# Patient Record
Sex: Male | Born: 1950 | Race: White | Hispanic: No | Marital: Married | Smoking: Former smoker
Health system: Southern US, Community
[De-identification: ages and names within clinical notes are randomized; demographics above are authoritative.]

## PROBLEM LIST (undated history)

## (undated) DIAGNOSIS — Z789 Other specified health status: Secondary | ICD-10-CM

## (undated) DIAGNOSIS — E119 Type 2 diabetes mellitus without complications: Secondary | ICD-10-CM

## (undated) DIAGNOSIS — H9190 Unspecified hearing loss, unspecified ear: Secondary | ICD-10-CM

## (undated) DIAGNOSIS — J449 Chronic obstructive pulmonary disease, unspecified: Secondary | ICD-10-CM

## (undated) DIAGNOSIS — I2510 Atherosclerotic heart disease of native coronary artery without angina pectoris: Secondary | ICD-10-CM

## (undated) DIAGNOSIS — G629 Polyneuropathy, unspecified: Secondary | ICD-10-CM

## (undated) DIAGNOSIS — G600 Hereditary motor and sensory neuropathy: Secondary | ICD-10-CM

## (undated) DIAGNOSIS — E785 Hyperlipidemia, unspecified: Secondary | ICD-10-CM

## (undated) HISTORY — PX: APPENDECTOMY: SHX54

## (undated) HISTORY — DX: Unspecified hearing loss, unspecified ear: H91.90

## (undated) HISTORY — DX: Hyperlipidemia, unspecified: E78.5

## (undated) HISTORY — DX: Atherosclerotic heart disease of native coronary artery without angina pectoris: I25.10

## (undated) HISTORY — DX: Other specified health status: Z78.9

## (undated) HISTORY — PX: HERNIA REPAIR: SHX51

## (undated) HISTORY — DX: Type 2 diabetes mellitus without complications: E11.9

## (undated) HISTORY — DX: Polyneuropathy, unspecified: G62.9

## (undated) HISTORY — DX: Hereditary motor and sensory neuropathy: G60.0

---
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
## 2002-05-29 ENCOUNTER — Inpatient Hospital Stay (HOSPITAL_COMMUNITY): Admission: EM | Admit: 2002-05-29 | Discharge: 2002-06-03 | Payer: Self-pay | Admitting: *Deleted

## 2002-05-29 ENCOUNTER — Encounter: Payer: Self-pay | Admitting: Cardiology

## 2002-06-01 ENCOUNTER — Encounter: Payer: Self-pay | Admitting: Cardiology

## 2004-05-10 ENCOUNTER — Ambulatory Visit: Payer: Self-pay | Admitting: Cardiology

## 2004-09-15 ENCOUNTER — Encounter: Payer: Self-pay | Admitting: Cardiology

## 2005-05-09 ENCOUNTER — Ambulatory Visit: Payer: Self-pay | Admitting: Cardiology

## 2005-05-24 ENCOUNTER — Ambulatory Visit: Payer: Self-pay | Admitting: Cardiology

## 2006-08-29 ENCOUNTER — Ambulatory Visit: Payer: Self-pay | Admitting: Cardiology

## 2007-04-24 DIAGNOSIS — I2510 Atherosclerotic heart disease of native coronary artery without angina pectoris: Secondary | ICD-10-CM

## 2007-04-24 HISTORY — DX: Atherosclerotic heart disease of native coronary artery without angina pectoris: I25.10

## 2007-04-24 HISTORY — PX: CARDIAC CATHETERIZATION: SHX172

## 2007-09-01 ENCOUNTER — Ambulatory Visit: Payer: Self-pay | Admitting: Cardiology

## 2007-12-02 ENCOUNTER — Encounter: Payer: Self-pay | Admitting: Cardiology

## 2007-12-02 ENCOUNTER — Ambulatory Visit: Payer: Self-pay | Admitting: Cardiology

## 2007-12-02 ENCOUNTER — Inpatient Hospital Stay (HOSPITAL_COMMUNITY): Admission: EM | Admit: 2007-12-02 | Discharge: 2007-12-04 | Payer: Self-pay | Admitting: Emergency Medicine

## 2007-12-03 ENCOUNTER — Encounter: Payer: Self-pay | Admitting: Cardiology

## 2007-12-04 ENCOUNTER — Encounter: Payer: Self-pay | Admitting: Cardiology

## 2007-12-15 ENCOUNTER — Ambulatory Visit: Payer: Self-pay | Admitting: Cardiology

## 2008-01-23 ENCOUNTER — Encounter: Payer: Self-pay | Admitting: Cardiology

## 2008-01-23 ENCOUNTER — Ambulatory Visit: Payer: Self-pay

## 2008-10-26 ENCOUNTER — Telehealth: Payer: Self-pay | Admitting: Cardiology

## 2008-11-03 ENCOUNTER — Encounter: Payer: Self-pay | Admitting: Cardiology

## 2008-11-03 ENCOUNTER — Encounter: Payer: Self-pay | Admitting: Physician Assistant

## 2008-11-03 ENCOUNTER — Ambulatory Visit: Payer: Self-pay | Admitting: Cardiology

## 2009-01-05 DIAGNOSIS — I2510 Atherosclerotic heart disease of native coronary artery without angina pectoris: Secondary | ICD-10-CM | POA: Insufficient documentation

## 2009-01-05 DIAGNOSIS — E785 Hyperlipidemia, unspecified: Secondary | ICD-10-CM | POA: Insufficient documentation

## 2010-01-02 ENCOUNTER — Ambulatory Visit: Payer: Self-pay | Admitting: Cardiology

## 2010-05-23 NOTE — Cardiovascular Report (Signed)
Summary: Cardiac Catheterization  Cardiac Catheterization   Imported By: Dorise Hiss 12/30/2009 15:13:02  _____________________________________________________________________  External Attachment:    Type:   Image     Comment:   External Document

## 2010-05-23 NOTE — Assessment & Plan Note (Signed)
Summary: 60 YR FU   Visit Type:  Follow-up Primary Jaceyon Strole:  Ignacia Bayley  CC:  CAD.  History of Present Illness: The patient presents for followup. In the past 60 year he has done well and has had no coronary symptoms. He denies any chest pain, neck or arm discomfort. He has no palpitations, presyncope or syncope. He has some chronic dyspnea but denies any resting shortness of breath, PND or orthopnea. He has had no palpitations, presyncope or syncope. He has not tolerated statins but was recently given a prescription for fenofibrate. He did not start this as he wanted to discuss it today. He says his blood sugars were controlled.  Preventive Screening-Counseling & Management  Alcohol-Tobacco     Smoking Status: quit     Year Quit: 2007  Current Medications (verified): 1)  Metoprolol Tartrate 50 Mg Tabs (Metoprolol Tartrate) .... Take One Tablet By Mouth Twice A Day 2)  Aspirin 81 Mg Tbec (Aspirin) .... Take One Tablet By Mouth Daily 3)  Fish Oil 1000 Mg Caps (Omega-3 Fatty Acids) .... Take 2 Capsules By Mouth Daily 4)  Lisinopril 10 Mg Tabs (Lisinopril) .... Take 1 Tablet By Mouth Once A Day 5)  Gabapentin 300 Mg Caps (Gabapentin) .... Take 1 Tablet By Mouth Two Times A Day 6)  Metformin Hcl 500 Mg Tabs (Metformin Hcl) .... Take 1 Tablet By Mouth Two Times A Day 7)  Lipofen 150 Mg Caps (Fenofibrate) .... Take 1 Tablet By Mouth Once A Day (Patient Hasn't Started This Yet)  Allergies (verified): 1)  ! * Statins  Comments:  Nurse/Medical Assistant: The patient's medication bottles and allergies were reviewed with the patient and were updated in the Medication and Allergy Lists.  Past History:  Past Medical History: HYPERLIPIDEMIA-MIXED (ICD-272.4) CAD, NATIVE VESSEL (ICD-414.01)a.           History of inferior myocardial infarction, treated with      CYPHER stenting of circumflex and right coronary artery, February      2004.     b.     Status post non-ST segment  myocardial infarction treated      with PROMUS drug-eluting stenting of distal right coronary artery      and posterior descending (coronary) artery, August 2009.     c.     EF 55-60%.     d.     Non-ischemic exercise Myoview; EF 50%, October 2009. Type 2 diabetes mellitus Charcot-Marie-Tooth  Review of Systems       As stated in the HPI and negative for all other systems.   Vital Signs:  Patient profile:   60 year old male Height:      76 inches Weight:      197 pounds BMI:     24.07 Pulse rate:   76 / minute BP sitting:   127 / 83  (left arm) Cuff size:   regular  Vitals Entered By: Carlye Grippe ([birth year] 9:00 AM)  Physical Exam  General:  Well developed, well nourished, in no acute distress. Head:  normocephalic and atraumatic Eyes:  PERRLA/EOM intact; conjunctiva and lids normal. Mouth:  Edentulous.  Oral mucosa normal. Neck:  Neck supple, no JVD. No masses, thyromegaly or abnormal cervical nodes. Chest Wall:  no deformities or breast masses noted Lungs:  Clear bilaterally to auscultation and percussion. Heart:  Non-displaced PMI, chest non-tender; regular rate and rhythm, S1, S2 without murmurs, rubs or gallops. Carotid upstroke normal, no bruit. Normal abdominal aortic size, no bruits.  Femorals normal pulses, no bruits. Pedals normal pulses. No edema, no varicosities. Abdomen:  Bowel sounds positive; abdomen soft and non-tender without masses, organomegaly, or hernias noted. No hepatosplenomegaly. Msk:  Back normal, normal gait. Muscle strength and tone normal. Extremities:  No clubbing or cyanosis. Neurologic:  Alert and oriented x 3. Skin:  Intact without lesions or rashes. Cervical Nodes:  no significant adenopathy Inguinal Nodes:  no significant adenopathy Psych:  Normal affect.   EKG  Procedure date:  01/02/2010  Findings:      Sus rhythm, rate 79, left axis deviation, left anterior fascicular block, no acute ST-T wave changes  Impression &  Recommendations:  Problem # 1:  CAD, NATIVE VESSEL (ICD-414.01) The patient is having no new symptoms. No further cardiovascular testing is suggested. He will continue with risk reduction. I suggested more walking and gave him specific instructions. Orders: EKG w/ Interpretation (93000)  Problem # 2:  HYPERLIPIDEMIA-MIXED (ICD-272.4) I assured him that it would be reasonable to start the fenofibrate. He can then have his lipids checked in about 8 weeks.  This will be followed by his primary MD.  Patient Instructions: 1)  Your physician recommends that you continue on your current medications as directed. Please refer to the Current Medication list given to you today. 2)  Your physician wants you to follow-up in: 1 year. You will receive a reminder letter in the mail about two months in advance. If you don't receive a letter, please call our office to schedule the follow-up appointment. Prescriptions: LISINOPRIL 10 MG TABS (LISINOPRIL) Take 1 tablet by mouth once a day  #30 x 6   Entered by:   Carlye Grippe   Authorized by:   Rollene Rotunda, MD, [facility]   Signed by:   Carlye Grippe on 01/02/2010   Method used:   Electronically to        [facility]  [place] [address] (retail)       [address]       [place], [place]  [address]       Ph: [phone] or [phone]       Fax: [phone]   RxID:   [id number] METOPROLOL TARTRATE 50 MG TABS (METOPROLOL TARTRATE) Take one tablet by mouth twice a day  #60 x 6   Entered by:   Carlye Grippe   Authorized by:   Rollene Rotunda, MD, [facility]   Signed by:   Carlye Grippe on 01/02/2010   Method used:   [facility] to        [facility]  [place] [address]       [address]       [place], [place]  [address]       Ph: [phone] or [phone]       Fax: [phone]   RxID:   [id number]

## 2010-09-05 NOTE — Assessment & Plan Note (Signed)
[facility]                          [facility] NOTE   NAME:Franklin [name], Franklin [name]                     MRN:          [id number]  DATE:09/01/2007                            DOB:          09-01-1950    HISTORY OF PRESENT ILLNESS:  The patient is a 60 year old male with  history of ischemic cardiomyopathy, ejection fraction 45-50%.  The  patient  is status post stent placement with drug-eluting stent to the  circumflex right coronary artery in 2004.  He has done extremely well.  He has not required any repeat catheterization or re-intervention.  Unfortunately, the patient has lost his job and wants to minimize cost  of his medical workup.  He denies any chest pain, palpitation, or  syncope.   MEDICATIONS:  1. Toprol XL 25 mg p.o. daily.  2. Aspirin 81 mg p.o. daily.  3. Fish oil.   PHYSICAL EXAMINATION:  VITAL SIGNS:  Blood pressure 135/81, heart rate  57, weight 218 pounds.  Neck exam, normal carotid upstrokes, no carotid bruits.  LUNGS:  Clear breath sounds bilaterally.  HEART:  Regular rate and rhythm.  Normal S1, S2.  ABDOMEN:  Soft.  EXTREMITIES:  No cyanosis, edema.   PROBLEM LIST:  1. Coronary artery.      a.     Status post stent placement with drug-eluting stent to       circumflex right coronary 2004.      b.     New York Heart Association class I.  2. Mild left ventricular dysfunction.  Ejection fraction 45% without      heart failure symptoms.  3. Former tobacco use, discontinued.   PLAN:  1. The patient doing extremely well.  He reports no substernal chest      pain.  He does not need a stress test at the present time.  It can      be done in 1 year.  2. The patient can continue on his current medical regimen.     Learta Codding, MD,FACC  Electronically Signed    GED/MedQ  DD: 09/01/2007  DT: 09/01/2007  Job #: [id number]

## 2010-09-05 NOTE — Assessment & Plan Note (Signed)
[facility]                          [facility] NOTE   NAME:Jian, PRESTYN STANCO                     MRN:          [id number]  DATE:11/03/2008                            DOB:          Dec 04, 1950    PRIMARY CARDIOLOGIST:  Learta Codding, MD, [facility]   REASON FOR VISIT:  Annual followup.   Mr. Tapp reports no interim development of exertional angina  pectoris or any chest discomfort similar to his previous myocardial  infarctions.  He does suggest atypical chest pain with occasional sharp  pain which occurs at rest, and which lasts less than a minute in  duration.   Mr. Rounsaville was previously on Plavix, and stopped taking this after 9  months.  He was obtaining this in generic form, from [place], until he  was no longer able to do so.  He remains on full-dose aspirin.   Mr. Cosma does report exertional dyspnea, but denies PND, orthopnea,  or lower extremity edema.   The patient quit smoking in 2009.  Mr. Dunavant does carry  nitroglycerin with him, but has not had to use any.   A 12-lead EKG today indicates NSR at 86 bpm with LAD and nonspecific ST  abnormalities.   CURRENT MEDICATIONS:  1. Aspirin 325 daily.  2. Metformin 500 b.i.d.  3. Lisinopril 10 daily.  4. Metoprolol 50 daily.   PHYSICAL EXAMINATION:  VITAL SIGNS:  Blood pressure 134/78, pulse 86 and  regular, weight 209 (down 6).  GENERAL:  A 60 year old male sitting upright in no distress.  HEENT:  Normocephalic, atraumatic.  NECK:  Palpable carotid pulses without bruits; no JVD.  LUNGS:  Clear to auscultation in all fields.  HEART:  Regular rate and rhythm.  Positive S4.  No murmurs.  ABDOMEN:  Soft, nontender with intact bowel sounds.  EXTREMITIES:  Palpable pulses without edema.  NEUROLOGIC:  No focal deficit.   IMPRESSION:  1. Coronary artery disease.      a.     History of inferior myocardial infarction, treated with       CYPHER stenting of circumflex and  right coronary artery, February       2004.      b.     Status post non-ST segment myocardial infarction treated       with PROMUS drug-eluting stenting of distal right coronary artery       and posterior descending (coronary) artery, August 2009.      c.     EF 55-60%.      d.     Non-ischemic exercise Myoview; EF 50%, October 2009.  2. History of tobacco.  3. Dyslipidemia.      a.     Statin intolerant.  4. Type 2 diabetes mellitus.   PLAN:  1. Up titrate metoprolol tartrate to 50 mg b.i.d., for better basal      heart rate and blood pressure control, as well as antianginal      benefit.  2. Down titrate aspirin to 81 mg daily, and continue indefinitely.  We      will add Omega-3 fish oil  2 g b.i.d.  Of note, the patient reports      intolerance to numerous statins.  3. Aggressive lipid management is recommended with target LDL of 70 or      less, if feasible.  4. Recommend cardiac rehab.  5. Schedule return clinic followup with myself and Dr. Andee Lineman in 6      months.      Rozell Searing, PA-C  Electronically Signed      Rollene Rotunda, MD, [facility]  Electronically Signed   GS/MedQ  DD: 11/03/2008  DT: 11/04/2008  Job #: [id number]   cc:   [facility]

## 2010-09-05 NOTE — Cardiovascular Report (Signed)
NAME:  Franklin [name], Franklin [name] NO.:  [id number]   MEDICAL RECORD NO.:  [id number]          PATIENT TYPE:  INP   LOCATION:  2913                         FACILITY:  [facility]   PHYSICIAN:  Verne Carrow, MDDATE OF BIRTH:  February 22, 1951   DATE OF PROCEDURE:  12/03/2007  DATE OF DISCHARGE:  12/04/2007                            CARDIAC CATHETERIZATION   PROCEDURES:  1. Percutaneous coronary intervention of distal right coronary artery      bifurcation of the posterior descending artery and the      posterolateral artery with placement of a Promus drug-eluting      stent.  2. Percutaneous coronary intervention of the distal right coronary      artery with placement of a Promus drug-eluting stent.   INDICATION:  Chest pain.   OPERATOR:  Verne Carrow, MD   DETAILS OF PROCEDURE:  The patient was brought to the heart  catheterization lab for a diagnostic left heart catheterization.  He  signed informed consent prior to his diagnostic procedure that included  consent for percutaneous coronary intervention if necessary.  After  preliminary review of his cath films, it was felt that he would benefit  from an intervention of his distal right coronary artery as well as the  posterior descending artery.  The 5-French sheath used for the  diagnostic procedure was changed to a 7-French sheath.  A JR4, 7-French  guide was used to engage the right coronary artery.  A Cougar  intracoronary wire was passed down the right coronary into the posterior  descending branch.  A BMW wire was passed into the posterolateral  branch.  At this point, a 2.5 x 10 mm cutting balloon was used to  predilate the osteum of the posterolateral branch.  It was also used to  predilate the distal right coronary artery just before the bifurcation  of the PDA and posterolateral branch.  A Promus 2.5 x 18 mm stent was  then placed in the distal right coronary artery extending into the  posterior descending  artery.  A 2.75 x 15 mm noncompliant balloon was  then used for post dilation of this stent.  Following the stent  placement, there was a lesion in the distal right coronary artery, that  was about 20-mm proximal to the stent placement that looked worsened  with about 90% stenosis.  I elected to place a 2.75 x 8 mm Promus drug-  eluting stent in this location.  A 3.0 x 6 mm balloon was then used for  post dilation.  There was TIMI 3 flow down the entire right coronary  artery following the procedure.  Prestenosis was 90% in the distal  right/posterior descending artery and following procedure it was 0%.  The prestenosis in the more proximal lesion in the distal RCA was 90%  prestenosis and 0% poststenosis.  Integrilin was used for antiplatelet  therapy during the procedure along with heparin for anticoagulation.  The patient will be continued on 12 hours of  Integrilin on the floor.  He was given 300 mg of Plavix here in the cath lab.  An Angio-Seal  closure  device was placed in the right femoral artery.   IMPRESSION:  Successful percutaneous coronary intervention of the distal  right coronary artery and posterior descending artery with placement of  2 Promus drug-eluting stents.   RECOMMENDATIONS:  I have recommended continuing the aspirin, statin, and  beta-blocker.  The patient will be continued on Plavix for at least 1  year, but more than likely for his lifetime as long as he can tolerate  it.      Verne Carrow, MD  Electronically Signed     CM/MEDQ  D:  12/03/2007  T:  12/05/2007  Job:  [id number]   cc:   Learta Codding, MD,FACC

## 2010-09-05 NOTE — Discharge Summary (Signed)
NAME:  CUTTER, PASSEY NO.:  [id number]   MEDICAL RECORD NO.:  [id number]          PATIENT TYPE:  INP   LOCATION:  2913                         FACILITY:  [facility]   PHYSICIAN:  Verne Carrow, MDDATE OF BIRTH:  1951-03-04   DATE OF ADMISSION:  12/02/2007  DATE OF DISCHARGE:  12/04/2007                               DISCHARGE SUMMARY   PRIMARY CARDIOLOGIST:  Learta Codding, MD, [facility]   DISCHARGE DIAGNOSES:  1. Non-ST-segment elevation myocardial infarction.  2. Newly diagnosed diabetes mellitus.   SECONDARY DIAGNOSES:  1. Coronary artery disease status post previous Cypher drug-eluting      stent placement in the left circumflex and right coronary artery.  2. Hypertension.  3. Hyperlipidemia.  4. Ischemic cardiomyopathy.  5. Ongoing tobacco abuse.   ALLERGIES:  He is intolerant to LIPITOR and Crestor both causing  myalgias.   PROCEDURE:  Left heart cardiac catheterization with successful PCI and  stenting of the distal RCA, PDA and mid RCA with 2.5 x 18 mm PROMUS drug-  eluting stent placed in the distal RCA and a 2.75 x 8 mm PROMUS drug-  eluting stent placed in the mid RCA.   HISTORY OF PRESENT ILLNESS:  A 60 year old Caucasian male with prior  history of CAD who was in his usual state of health until December 02, 2007, when he had sudden onset of substernal chest pressure radiating to  his neck and left arm lasting approximately 15 minutes resolving  spontaneously.  He presented to the [facility] ED where his EEG showed  nonspecific ST-T changes and first set of cardiac markers were normal.  His symptomatology was suspicious for acute coronary syndrome.  The  patient was admitted for further evaluation.   HOSPITAL COURSE:  The patient had no additional chest discomfort,  however, did have mild elevation in his troponin to 0.11 with normal CKs  and MBs.  Symptoms were felt to be consistent with previous angina and  he underwent left heart cardiac  catheterization on December 03, 2007.  Catheterization revealed 90% stenosis in the distal RCA with diffuse  nonobstructive disease in the left coronary tree.  EF was 55-60%.  Films  were reviewed and decision was made to pursue PCI of the distal RCA.  The mid and distal RCA along with the PDA were successfully stented with  two PROMUS drug-eluting stent as outlined above.  The patient was  initiated on Plavix and maintained on aspirin and beta blocker therapy.  We have also initiated pravastatin therapy, avoiding high-dose statin  given prior history of intolerance.  We have had him seen by case  management in order to assist in affording his Plavix therapy.  He has  also been counseled on the importance of smoking cessation.  He has been  ambulating without recurrent discomfort be discharged home today in good  condition.   DISCHARGE LABORATORY DATA:  Hemoglobin 14.1, hematocrit 40.2, WBC 6.3,  platelets 191, MCV 90.7.  Sodium 140, potassium 3.9, chloride 106, CO2  28, BUN 16, creatinine 1.14, glucose 128, INR 1.1, total bilirubin 0.6,  alkaline phosphatase 55, AST  21, ALT 36, albumin 3.7, CK 121, MB 1.7,  troponin I 0.09, total cholesterol 148, triglycerides 136, HDL 26, LDL  95, calcium 8.8, magnesium 2.4, and hemoglobin A1c 7.4.   DISPOSITION:  The patient is being discharged home today in good  condition.   FOLLOWUP PLANS AND APPOINTMENTS:  The patient has followup with Dr.  Andee Lineman on December 15, 2007, at 8:30 a.m.  We have counseled him to follow  up with primary care for further followup of hyperglycemia with fasting  sugars greater than 126 indicative of type 2 diabetes mellitus.   DISCHARGE MEDICATIONS:  1. Aspirin 325 mg daily.  2. Plavix 75 mg.  3. Lopressor 25 mg b.i.d.  4. Nitroglycerin 0.4 mg sublingual p.r.n. chest pain.  5. Pravachol 40 mg at bedtime.   The patient has refused any therapy for his diabetes at this time.   OUTSTANDING LABORATORY STUDIES:  None.    DURATION OF DISCHARGE ENCOUNTER:  60 minutes including physician time.      Nicolasa Ducking, ANP      Verne Carrow, MD  Electronically Signed    CB/MEDQ  D:  12/04/2007  T:  12/05/2007  Job:  [id number]

## 2010-09-05 NOTE — H&P (Signed)
NAME:  Franklin [name], Franklin [name] NO.:  [id number]   MEDICAL RECORD NO.:  [id number]          PATIENT TYPE:  INP   LOCATION:  2913                         FACILITY:  [facility]   PHYSICIAN:  Vernice Jefferson, MD          DATE OF BIRTH:  [birth year]   DATE OF ADMISSION:  12/02/2007  DATE OF DISCHARGE:                              HISTORY & PHYSICAL   PRIVATE CARDIOLOGIST:  Learta Codding, MD, [facility]   CHIEF COMPLAINT:  Chest discomfort x6 hours.   HISTORY OF PRESENTING ILLNESS:  The patient is a 60 year old white male  with history of coronary artery disease status post PCI in 2004 with 2  Cypher drug-eluting stents to his right coronary artery and 1 Cypher  drug-eluting stent to his left circumflex, hypertension, and  hyperlipidemia who comes in with complaints of chest discomfort.  The  patient reports that since his stent placement in 2004 he has been  completely asymptomatic, denies any history of chest pain, chest  discomfort, worsening exertional shortness of breath, or anginal  symptoms, since this has followed up with Dr. Andee Lineman on several  occasions.  He has had several nuclear stress tests in the past that  have all been  negative.  However, today he reports it was different,  reports that he had had some substernal chest discomfort that lasted  approximately 10 minutes, earlier approximately 2, went away with an  aspirin, and then recurred while sitting down after dinner that resolved  after receiving 2 doses of nitroglycerin.  Reports this pain was  pressure like, radiated up to his left neck and jaw as well as left arm,  and was similar to his prior presentation with his MI.  Denied any  shortness of breath, diaphoresis, or nausea with this, did not have any  syncope or presyncopal episodes, and has been otherwise in good health  up until today.   PAST MEDICAL HISTORY:  1. Coronary artery disease.  He is status post a Cypher 3 x 13 mm drug-      eluting stent to his left  circumflex artery and status post 2 drug-      eluting stents to his right coronary artery, 3 x 23 and 3 x 33 mm      stents.  Also had some residual proximal LAD per the cath report.  2. Hyperlipidemia.  3. Hypertension.  4. History of tobacco abuse, now has quit.   MEDICATIONS:  1. Toprol-XL 25 mg a day.  2. Aspirin 81 mg a day.  3. Fish oil.   SOCIAL HISTORY:  Former smoker but has quit after his MI in 2004.  Lives  in [place].  No alcohol.  No drug use.   FAMILY HISTORY:  Reviewed and noncontributory to the patient's current  medical condition.   ALLERGIES:  No known drug allergies.   REVIEW OF SYSTEMS:  Negative 11-point review systems except for those  dictated in the above HPI.   PHYSICAL EXAMINATION:  VITAL SIGNS:  Blood pressure of 131/72, heart  rate of 60, respirations of 12, and T-max afebrile.  GENERAL:  A well-developed, well-nourished white male in no acute  distress.  HEENT:  Moist mucous membranes.  No scleral icterus or conjunctival  pallor.  NECK:  Supple.  Full range of motion.  No jugular venous distention.  CARDIOVASCULAR:  Regular rate and rhythm.  No rubs, murmurs, or gallops.  CHEST:  Clear to auscultation bilaterally.  No wheezes, rales, or  rhonchi.  ABDOMEN:  Soft, nontender, and nondistended.  Normoactive bowel sounds.  EXTREMITIES:  No peripheral edema, pulses 2+ bilaterally.  NEURO:  Nonfocal.   Chest x-ray, no acute infiltrative process.  EKG demonstrates normal  sinus rhythm and left anterior fascicular block with nonspecific ST and  T-wave abnormality.   LABORATORY DATA:  Significant for white count of 8.4, hemoglobin of  15.3, and platelets 202.  BUN and creatinine of 18 and 1.2, glucose 178,  which is elevated for him.  First set of biomarkers are negative.   IMPRESSION:  1. Probable acute coronary syndrome (ACS).  2. Hypertension.  3. Impaired glucose tolerance.   PLAN:  Admit the patient to telemetry for Dr. Andee Lineman.  Serial   biomarkers.  We will initiate heparin and aspirin as well as give him a  high-dose statin initially and beta-blocker therapy.  We will re-  stratify for further cardiovascular risk factors with hemoglobin A1c and  fasting lipid panel.  Keep him n.p.o. for noninvasive versus invasive.  Risk stratification in the morning per the primary cardiology team.      Vernice Jefferson, MD  Electronically Signed     JT/MEDQ  D:  12/02/2007  T:  12/04/2007  Job:  [id number]

## 2010-09-05 NOTE — Cardiovascular Report (Signed)
NAME:  Franklin [name], Franklin [name] NO.:  [id number]   MEDICAL RECORD NO.:  [id number]          PATIENT TYPE:  INP   LOCATION:  2913                         FACILITY:  [facility]   PHYSICIAN:  Bevelyn Buckles. Bensimhon, MDDATE OF BIRTH:  28-Jan-1951   DATE OF PROCEDURE:  12/03/2007  DATE OF DISCHARGE:                            CARDIAC CATHETERIZATION   PATIENT IDENTIFICATION:  Franklin [name] is a 60 year old male with a  history of hypertension and coronary artery disease.  He is status post  percutaneous intervention on his left circumflex and RCA in 2004 with  Cypher drug-eluting stents.  He was doing well until yesterday when he  developed chest pain very similar to his previous angina.  His troponin  was just minimally elevated at 0.11 and MBs were negative.  He is  referred for diagnostic catheterization.   PROCEDURES PERFORMED:  1. Selective coronary angiography.  2. Left heart catheterization.  3. Left ventriculogram.   DESCRIPTION OF PROCEDURE:  The risks and indication of the procedure  were explained.  Consent was signed and placed on the chart.  A 5-French  arterial sheath was placed in right femoral artery using a modified  Seldinger technique.  Standard catheters including JL-4, JR-4, and  angled pigtail were used for procedure.  All catheter exchanges were  made over wire.  There were no apparent complications.   Central aortic pressure 135/71 with a mean of 95.  LV pressure was 132  with an EDP of 16.  There was no aortic stenosis.   Left main had distal 20% stenosis.   LAD coursed through the apex gave off two diagonal branches.  There was  a 60-70% proximal lesion in the LAD.  This was smooth.  There was mild  20-30% diffuse disease distally.  In the small first diagonal there was  a 40-50% focal stenosis.   Left circumflex gave off an OM-1 and OM-2.  There was a stent in the  proximal portion of the left circumflex.  Prior to the stent, there was  a 40% stenosis  in the mid portion of the left circ.  At the takeoff of  the OM-1, there was 70% focal stenosis.   Right coronary artery was a large dominant vessel and had a long area of  diffuse stenting from the proximal portion and almost down to the distal  bend.  There was mild diffuse disease within the stents, but the stents  were patent with good flow.  Right after the stent, there was a 40-50%  focal stenosis.  Distal to this, right before the PDA, there was a 90%  focal stenosis.  In the mid to distal PDA, there was a 40% stenosis.   Left ventriculogram done in the RAO position showed an EF of 55-60% with  no regional wall motion abnormalities.   ASSESSMENT:  1. Three-vessel coronary artery disease as described above.  2. The right coronary artery and left circumflex stents are patent.  3. There is a high-grade lesion in the distal right coronary artery.  4. Normal left ventricular function.   PLAN:  For percutaneous intervention on  the distal right coronary artery  by Dr. Clifton James.  He will need aggressive risk factor management.      Bevelyn Buckles. Bensimhon, MD  Electronically Signed     DRB/MEDQ  D:  12/03/2007  T:  12/04/2007  Job:  [id number]

## 2010-09-05 NOTE — Assessment & Plan Note (Signed)
[facility]                          [facility]   NAME:[name], [name]                     MRN:          [id number]  DATE:08/29/2006                            DOB:          10-24-1950    HISTORY OF PRESENT ILLNESS:  The patient is a 60 year old male with a  history of mild ischemic cardiomyopathy, ejection fraction 45-50%.  The  patient has a history of a prior inferior wall myocardial infarction  requiring a stent placement to the circumflex and right coronary artery.  He had a stress test done a year ago which showed no definite ischemia.  He has been doing quite well.  He reports no chest pain, shortness of  breath, orthopnea, PND.  He continues to use hawthorn berries as a  substitute for an ACE inhibitor.   MEDICATIONS:  1. Toprol XL 25 mg p.o. daily.  2. Hawthorn berries 550 mg daily.  3. Enteric-coated aspirin 325 daily.  4. Fish oil daily.   PHYSICAL EXAMINATION:  VITAL SIGNS:  Blood pressure 135/74, heart rate  73, weight is 211 pounds.  NECK:  Normal carotid upstrokes.  No carotid bruits.  LUNGS:  Clear.  HEART:  Regular rate and rhythm.  Normal S1 S2.  No murmur.  ABDOMEN:  Soft.  EXTREMITIES:  No edema.   PROBLEM LIST:  1. Coronary artery disease.      a.     Status post stent placement with a drug-eluting platform       circumflex and right coronary artery, 2004.      b.     New York Heart Association class I.      c.     Mild left ventricular dysfunction, ejection fraction 45-50%.      d.     Former tobacco use, discontinued.   PLAN:  1. The patient has been doing well.  He does not need a stress test at      this point in time.  2. The patient can continue on hawthorn berries.  3. We will repeat a stress test in two years.     Learta Codding, MD,FACC     GED/MedQ  DD: 08/29/2006  DT: 08/29/2006  Job #: [id number]

## 2010-09-05 NOTE — Assessment & Plan Note (Signed)
[facility]                          [facility] NOTE   NAME:[name], Franklin DERASMO                     MRN:          [id number]  DATE:11/03/2008                            DOB:          1951/01/16    ADDENDUM.      Gene Serpe, PA-C       Rollene Rotunda, MD, [facility]    GS/MedQ  DD: 11/03/2008  DT: 11/04/2008  Job #: [id number]   cc:   Aaron Edelman [facility]

## 2010-09-05 NOTE — Assessment & Plan Note (Signed)
[facility]                          [facility] NOTE   NAME:Franklin [name], [name]                     MRN:          [id number]  DATE:12/15/2007                            DOB:          June 28, 1950    HISTORY OF PRESENT ILLNESS:  The patient is a 60 year old male with  history of coronary artery disease albeit with a currently preserved LV  function.  The patient had a prior stent placement with the circumflex  coronary artery in 2004.  He presented on December 02, 2007, with a  recurrent ischemic event with non-ST elevation myocardial infarction.  The patient also was reportedly newly diagnosed with diabetes mellitus.  The patient underwent PCI of the distal right coronary artery, PDA mid  RCA with a 2.5 x 18 mm Promus drug-eluting stent in the distal RCA, and  2.75 x 8 mm Promus drug-eluting stent in the mid RCA.  The patient has  done well postprocedure.  He has also stopped smoking.  He has had no  recurrent substernal chest pain, shortness of breath, orthopnea, or PND.  He has no palpitations or syncope.   MEDICATIONS:  1. Aspirin 325 mg p.o. daily.  2. Lopressor 25 mg p.o. daily.  3. Plavix 75 mg p.o. daily.   PHYSICAL EXAMINATION:  VITAL SIGNS:  Blood pressure 134/87, heart rate  69, and weight is 215 pounds.  NECK:  Normal carotid stroke and no carotid bruits.  LUNGS:  Clear breath sounds bilaterally.  HEART:  Regular rate and rhythm with normal S1 and S2.  No murmurs,  rubs, or gallops.  ABDOMEN:  Soft and nontender.  Ne rebound or guarding.  Good bowel  sounds.  EXTREMITIES:  No cyanosis, clubbing, or edema.   PROBLEM LIST:  1. Coronary artery disease.      a.     Status post recent non-ST-elevation myocardial function.      b.     Status post stent placement x2, see details above.      c.     Status post drug-eluting stent placement to the circumflex       coronary artery in 2004.      d.     New York Heart Association class  I.  2. Normal left ventricular function by recent catheterization.  3. Former tobacco use discontinued.  4. Dyslipidemia.  5. Rule out diabetes mellitus.   PLAN:  1. The patient has significant financial difficulties, and we will      provide him with a Plavix.  2. The patient has stopped smoking and I encouraged him about this.  3. The patient was advised to go on a low-carbohydrate diet.  It is      possible, he indeed has diabetes mellitus, but we will repeat his      laboratory blood work including hemoglobin A1c and glucose and BMET      after he has complied with a low-carbohydrate diet.  4. The patient is currently not taking cholesterol-lowering drugs, and      I have given him      prescription for generic pravastatin  20 mg p.o. at bedtime.  This      can be checked on his upcoming blood work.     Learta Codding, MD,FACC  Electronically Signed    GED/MedQ  DD: 12/15/2007  DT: 12/15/2007  Job #: [id number]

## 2010-09-08 NOTE — Discharge Summary (Signed)
NAME:  Franklin [name], VAZGUEZ NO.:  [id number]   MEDICAL RECORD NO.:  [id number]                   PATIENT TYPE:  INP   LOCATION:  6523                                 FACILITY:  [facility]   PHYSICIAN:  Charlies Constable, M.D. [facility]              DATE OF BIRTH:  Jun 16, 1950   DATE OF ADMISSION:  05/29/2002  DATE OF DISCHARGE:  06/03/2002                           DISCHARGE SUMMARY - REFERRING   DISCHARGE DIAGNOSES:  1. Non-ST elevated myocardial infarction.  2. Hyperlipidemia, treated.  3. Tobacco abuse, counseled.  4. Elevated blood pressure.   HISTORY:  [name] is a 60 year old male patient who presented to  [facility] with substernal chest discomfort.  His initial 12-lead  revealed mild ST elevation in the inferior leads less than one millimeter.  The patient was transferred to [facility] for further cardiac work up and  did rule in for myocardial infarction.  Laboratory work revealed a maximum  CK of 2142 with a MB fraction of 267.8, maximum troponin of 49.87.  Other  laboratory work included total cholesterol 151, triglycerides 142, HDL 38,  LDL 95, sodium 142, potassium 3.8, BUN 16, creatinine 1.0, white count 9.1,  hemoglobin 13.5, hematocrit 39.4, platelets 212.  Chest x-ray reveals no  active disease.   Ultimately the patient underwent cardiac catheterization on June 01, 2002  and was found to have the following:  severe three vessel coronary artery  disease with a 30% left main stenosis, proximal 70% LAD, followed by 75%  stenosis of the proximal first diagonal, proximal circumflex 30% with a mid  90% followed by a distal 50% stenosis, proximal RCA 70%, 99% mid RCA with a  PDA of 50% stenosis.  Dr. Andee Lineman as well as Dr. Juanda Chance discussed with the  patient options including bypass surgery versus PCI.  The patient elected  for PCI.  Percutaneous intervention occurred on June 02, 2002 with a  stent to the circumflex as well as two stents  to the right coronary artery.  CYPHER stents were utilized.  Circumflex was reduced from a 90% to a less  than 10% lesion post procedure.  The right coronary artery revealed multiple  90% and 99% lesions that were reduced to a less than 10% lesion post  procedure.  The patient tolerated the procedure well, was kept in the  hospital overnight and was felt to be stable enough for discharge to home on  June 03, 2002.   DISCHARGE MEDICATIONS:  He will be discharged to home on the following  medications:  1. Enteric coated aspirin 325 mg daily.  2. Toprol XL 25 mg daily.  3. Lipitor 20 mg 1 p.o. q.h.s.  4. Plavix 75 mg 1 p.o. daily (for three to 12 months).  5. Sublingual nitroglycerin p.r.n. chest pain.  6. Tylenol 1-2 tablets every 6 hours as needed for pain.    DISCHARGE INSTRUCTIONS:  No strenuous activity for two days  then gradually  increase activity.  Remain on a low fat diet.  Call for questions or  concerns and followup at Dr. Ival Bible office on June 11, 2002 at 2:00  p.m.  The patient is allowed to return to work on June 15, 2002.     Guy Franco, P.A. [facility]                      Charlies Constable, M.D. [facility]    LB/MEDQ  D:  06/03/2002  T:  06/03/2002  Job:  [id number]   cc:   Colon Flattery  [address]  [place]  [place] [address]  Fax: [phone]   [facility]  [address]  [address]  [place], [place] [address]

## 2010-09-08 NOTE — Cardiovascular Report (Signed)
NAME:  Franklin [name], Franklin [name] NO.:  [id number]   MEDICAL RECORD NO.:  [id number]                   PATIENT TYPE:  INP   LOCATION:  2018                                 FACILITY:  [facility]   PHYSICIAN:  Charlies Constable, M.D. [facility]              DATE OF BIRTH:  [birth year]   DATE OF PROCEDURE:  06/02/2002  DATE OF DISCHARGE:                              CARDIAC CATHETERIZATION   CLINICAL HISTORY:  The patient is 60 years old and was admitted with chest  pain and enzyme elevations consistent with a non-ST-elevation infarction.  He also has tobacco use and hypertension.  He was studied yesterday by Dr.  Learta Codding and found to have three-vessel disease and surgery was  considered but the patient had reluctance to do this and we reviewed the  films and felt that percutaneous intervention of the circumflex and right  coronary artery was a reasonable alternative treatment.   PROCEDURES PERFORMED:  1. Stenting of the circumflex artery.  2. Stenting of the right coronary artery x2.   DESCRIPTION OF PROCEDURE:  The procedure was performed via the right femoral  artery using arterial sheath and 6-French preformed coronary catheters.  A  femoral arterial puncture was performed and Omnipaque contrast was used.  The patient was on Integrilin drip and was given weight-adjusted heparin to  bring the ACT to greater than 200 seconds.   We first approached the circumflex artery.  We used a 7-French CLS-4 guiding  catheter with sideholes and a short luge wire.  We crossed the lesion with  the wire without difficulty.  We direct-stented with a 3.0 x 13.0-mm CYPHER  stent, deploying this with one inflation of 14 atmospheres for 40 seconds.  We then post-dilated with a 2.75 x 8.0-mm Maverick, avoiding the distal  edge, performing two inflations up to 18 atmospheres for 35 seconds.   We then approached the right coronary artery.  We used the JR4 7-French  guiding catheter with  sideholes and a short luge wire.  Initially when we  took the first guiding catheter picture, I thought we had caused a guiding  catheter dissection but in retrospect, I think the catheter was partially  wedged and refluxed back in the proximal area, giving the appearance of a  dissection.  We never did lose flow and we were able to pass the wire down  to the distal vessel without too much difficulty.  We initially predilated  with a 2.5 x 30.0-mm CrossSail, performing two inflations in the mid and  proximal vessel at 12 atmospheres for 23 and 26 seconds.  We then deployed a  3.0 x 33.0 CYPHER stent in the mid-vessel, deploying it with one inflation  of 15 atmospheres for 40 seconds.  We then deployed a 3.0 x 23.0-mm CYPHER  in the proximal area overlying the stent in the mid-vessel and we deployed  this with one inflation of 18  atmospheres for 38 seconds.  We then used a  3.25 Quantum Maverick and performed a total of three inflations up to 16  atmospheres for 30 second up and down the stent, avoiding the distal edge.  Repeat diagnostic studies were then performed through the guiding catheter.  The patient tolerated the procedure well and left the laboratory in  satisfactory condition.   RESULTS:  Initially, the stenosis in the circumflex artery was estimated at  90%.  Following stenting, this improved to less than 10%.   Initially, the stenoses in the right coronary artery in the mid-vessel were  80% and in the proximal vessel were 90% and following stenting, these both  improved to less than 10% with tandem overlying stents.    CONCLUSION:  1. Successful stenting of the proximal circumflex lesion with a CYPHER stent     and improvement in the percentage of narrowing from 90% to less than 10%.  2. Successful stenting of a series of tandem lesions in the proximal and mid     right coronary artery with two overlapping CYPHER stent with improvement     in the percentage of narrowing  from 90% to less than 10%.   DISPOSITION:  The patient was taken to the post-angioplasty unit for further  observation.                                               Charlies Constable, M.D. [facility]    BB/MEDQ  D:  06/02/2002  T:  06/02/2002  Job:  [id number]   cc:   Colon Flattery M.D.   Jonelle Sidle, M.D. [facility]  [address]  [place]  [place] [address]  Fax: [phone]   Learta Codding, M.D. [facility]

## 2011-01-16 ENCOUNTER — Encounter: Payer: Self-pay | Admitting: Cardiology

## 2011-01-19 LAB — DIFFERENTIAL
Basophils Absolute: 0
Basophils Absolute: 0
Basophils Relative: 0
Basophils Relative: 1
Eosinophils Absolute: 0.1
Eosinophils Relative: 1
Lymphocytes Relative: 27
Lymphs Abs: 2.3
Monocytes Absolute: 0.7
Monocytes Absolute: 0.7
Monocytes Relative: 8
Neutro Abs: 4.4
Neutro Abs: 5.4
Neutrophils Relative %: 64

## 2011-01-19 LAB — GLUCOSE, CAPILLARY
Glucose-Capillary: 117 — ABNORMAL HIGH
Glucose-Capillary: 128 — ABNORMAL HIGH
Glucose-Capillary: 145 — ABNORMAL HIGH
Glucose-Capillary: 155 — ABNORMAL HIGH
Glucose-Capillary: 162 — ABNORMAL HIGH
Glucose-Capillary: 200 — ABNORMAL HIGH

## 2011-01-19 LAB — CBC
HCT: 40.2
HCT: 43.4
Hemoglobin: 14.1
Hemoglobin: 14.5
Hemoglobin: 15.3
MCHC: 34.7
MCHC: 35.3
MCV: 89.7
MCV: 90.7
Platelets: 191
Platelets: 202
RBC: 4.69
RBC: 4.83
RDW: 12.9
RDW: 13
RDW: 13.2
WBC: 6.3
WBC: 8.4
WBC: 8.8

## 2011-01-19 LAB — HEPATIC FUNCTION PANEL
ALT: 36
AST: 21
Albumin: 3.7
Alkaline Phosphatase: 55
Indirect Bilirubin: 0.4
Total Protein: 6.5

## 2011-01-19 LAB — POCT CARDIAC MARKERS
CKMB, poc: 1.1
CKMB, poc: 1.4
Myoglobin, poc: 102
Myoglobin, poc: 90.8
Troponin i, poc: <0.05
Troponin i, poc: <0.05

## 2011-01-19 LAB — POCT I-STAT, CHEM 8
BUN: 18
Calcium, Ion: 1.14
Chloride: 105
Creatinine, Ser: 1.2
Glucose, Bld: 178 — ABNORMAL HIGH
HCT: 45
Hemoglobin: 15.3
Potassium: 4.1
Sodium: 137
TCO2: 28

## 2011-01-19 LAB — BASIC METABOLIC PANEL
BUN: 16
Chloride: 106
Glucose, Bld: 128 — ABNORMAL HIGH
Potassium: 3.9
Sodium: 140

## 2011-01-19 LAB — HEPARIN LEVEL (UNFRACTIONATED): Heparin Unfractionated: 0.63

## 2011-01-19 LAB — MAGNESIUM: Magnesium: 2.4

## 2011-01-19 LAB — CARDIAC PANEL(CRET KIN+CKTOT+MB+TROPI)
CK, MB: 1.7
CK, MB: 1.8
Total CK: 121
Total CK: 126

## 2011-01-19 LAB — LIPID PANEL
HDL: 26 — ABNORMAL LOW
Total CHOL/HDL Ratio: 5.7
VLDL: 27

## 2011-02-02 ENCOUNTER — Ambulatory Visit: Payer: Self-pay | Admitting: Cardiology

## 2011-03-16 ENCOUNTER — Encounter: Payer: Self-pay | Admitting: *Deleted

## 2011-03-20 ENCOUNTER — Encounter: Payer: Self-pay | Admitting: Cardiology

## 2011-03-20 ENCOUNTER — Ambulatory Visit (INDEPENDENT_AMBULATORY_CARE_PROVIDER_SITE_OTHER): Payer: Medicare Other | Admitting: Cardiology

## 2011-03-20 VITALS — BP 111/76 | HR 78 | Ht 76.0 in | Wt 191.0 lb

## 2011-03-20 DIAGNOSIS — Z888 Allergy status to other drugs, medicaments and biological substances status: Secondary | ICD-10-CM

## 2011-03-20 DIAGNOSIS — I2510 Atherosclerotic heart disease of native coronary artery without angina pectoris: Secondary | ICD-10-CM

## 2011-03-20 DIAGNOSIS — G609 Hereditary and idiopathic neuropathy, unspecified: Secondary | ICD-10-CM

## 2011-03-20 DIAGNOSIS — Z789 Other specified health status: Secondary | ICD-10-CM | POA: Insufficient documentation

## 2011-03-20 DIAGNOSIS — E785 Hyperlipidemia, unspecified: Secondary | ICD-10-CM

## 2011-03-20 DIAGNOSIS — G629 Polyneuropathy, unspecified: Secondary | ICD-10-CM

## 2011-03-20 MED ORDER — GABAPENTIN 100 MG PO CAPS: ORAL_CAPSULE | ORAL | Status: DC

## 2011-03-20 NOTE — Assessment & Plan Note (Signed)
Followed by his primary care physician but again as outlined above unable to tolerate statins which cause severe myalgias.

## 2011-03-20 NOTE — Patient Instructions (Signed)
   Begin Gabapentin 300mg  every evening   Patient to call office if above med works Your physician wants you to follow up in:  1 year.  You will receive a reminder letter in the mail one-two months in advance.  If you don't receive a letter, please call our office to schedule the follow up appointment

## 2011-03-20 NOTE — Assessment & Plan Note (Signed)
The patient is doing well from a cardiac perspective. He continues on aspirin. Is unable to tolerate statins. Bedside echocardiogram confirmed relatively normal ejection fraction with an area of inferior scar likely related to his prior myocardial infarction involving the distal PDA. The patient has no heart failure symptoms.

## 2011-03-20 NOTE — Assessment & Plan Note (Signed)
Secondary to diabetes mellitus and Charcot-Marie-Tooth. I've given the patient a prescription of gabapentin to try 300 mg in the evening as this may help with neuropathic pain and also may help with sleep.

## 2011-03-20 NOTE — Progress Notes (Signed)
CC: Followup patient with coronary artery disease   HPI:  The patient is 60 year old male with a history of coronary artery disease and prior stenting. Ejection fraction is approximately 50%. 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 He states that the neuropathic pain keeps him awake at night quite a bit. He is unable to tolerate statins secondary to severe myalgias. From a cardiac standpoint the patient is actually doing quite well. He denies any chest pain shortness of breath orthopnea or PND he has no palpitations or syncope. He monitors his diabetes carefully. We reviewed his electrocardiogram and this is within normal limits.   PMH: reviewed and listed in Problem List in Electronic Records (and see below) Past Medical History  Diagnosis Date  . Other and unspecified hyperlipidemia   . Coronary atherosclerosis of native coronary artery     History of inferior myocardial infarction, treated with   CYPHER stenting of circumflex and right coronary artery, February 2004.Status post non-ST segment myocardial infarction treated with PROMUS drug-eluting stenting of distal right coronary artery and posterior descending (coronary) artery, August 2009.  EF 55-60%. Non-ischemic exercise Myoview; EF 50%, October 2009.    . Type 2 diabetes mellitus   . Charcot-Marie-Tooth disease   . Peripheral neuropathy   . Statin intolerance     Severe myalgias     Allergies/SH/FHX : available in Electronic Records for review  Medications: Current Outpatient Prescriptions  Medication Sig Dispense Refill  . aspirin EC 81 MG tablet Take 81 mg by mouth daily.        . fish oil-omega-3 fatty acids 1000 MG capsule Take 2 g by mouth daily.        . metFORMIN (GLUCOPHAGE) 500 MG tablet Take 500 mg by mouth 2 (two) times daily with a meal.        . metoprolol (LOPRESSOR) 50 MG tablet Take 50 mg by mouth 2 (two) times daily.          ROS: No nausea  or vomiting. No fever or chills.No melena or hematochezia.No bleeding.No claudication  Physical Exam: BP 111/76  Pulse 78  Ht 6\' 4"  (1.93 m)  Wt 191 lb (86.637 kg)  BMI 23.25 kg/m2 General: well- nourished. No distress HEENT: normal carotid upstroke. Normal JVP. No thyromegaly Cardiac: RRR, NL S1/S2. No pathologic murmurs Lungs: clear BS bilaterally, no wheezing. Abdomen, soft, non- tender Extremities. No edema. Normal distal pulses Skin: warm and dry Physologic ; normal affect.    12lead ECG: Normal sinus rhythm no acute changes Limited bedside ECHO: Ejection fraction approximately 50% with distal septal hypokinesis and inferior hypokinesis. No significant mitral regurgitation    Assessment and Plan

## 2011-05-01 ENCOUNTER — Other Ambulatory Visit: Payer: Self-pay | Admitting: Cardiology

## 2011-05-02 ENCOUNTER — Other Ambulatory Visit: Payer: Self-pay | Admitting: Cardiology

## 2012-01-14 LAB — BASIC METABOLIC PANEL
BUN: 25 mg/dL — AB (ref 4–21)
Creatinine: 1.1 mg/dL (ref 0.6–1.3)
Glucose: 96 mg/dL
Potassium: 4.9 mmol/L (ref 3.4–5.3)

## 2012-01-14 LAB — LIPID PANEL: HDL: 28 mg/dL — AB (ref 35–70)

## 2012-01-14 LAB — HEPATIC FUNCTION PANEL
ALT: 15 U/L (ref 10–40)
AST: 17 U/L (ref 14–40)
Bilirubin, Total: 0.7 mg/dL

## 2012-03-14 DIAGNOSIS — Z9861 Coronary angioplasty status: Secondary | ICD-10-CM | POA: Insufficient documentation

## 2012-07-09 ENCOUNTER — Ambulatory Visit: Payer: Self-pay | Admitting: Physician Assistant

## 2012-07-11 ENCOUNTER — Telehealth: Payer: Self-pay | Admitting: Physician Assistant

## 2012-07-11 ENCOUNTER — Other Ambulatory Visit: Payer: Self-pay | Admitting: Physician Assistant

## 2012-07-11 DIAGNOSIS — E119 Type 2 diabetes mellitus without complications: Secondary | ICD-10-CM

## 2012-07-11 MED ORDER — METFORMIN HCL 500 MG PO TABS: 500.0000 mg | ORAL_TABLET | Freq: Two times a day (BID) | ORAL | Status: DC

## 2012-07-11 NOTE — Telephone Encounter (Signed)
Needs metformin filled please call [facility]

## 2012-07-14 NOTE — Telephone Encounter (Signed)
Metformin sent to Pharmacy 07/11/12

## 2012-08-26 ENCOUNTER — Other Ambulatory Visit: Payer: Self-pay

## 2012-08-26 MED ORDER — GABAPENTIN 100 MG PO CAPS: 100.0000 mg | ORAL_CAPSULE | Freq: Three times a day (TID) | ORAL | Status: DC

## 2012-09-28 ENCOUNTER — Other Ambulatory Visit: Payer: Self-pay | Admitting: Family Medicine

## 2012-09-29 NOTE — Telephone Encounter (Signed)
HAS APPT 10/07/12 WITH BILL [facility]

## 2012-10-03 ENCOUNTER — Ambulatory Visit: Payer: Self-pay | Admitting: Family Medicine

## 2012-10-07 ENCOUNTER — Ambulatory Visit: Payer: Self-pay | Admitting: Family Medicine

## 2012-10-29 ENCOUNTER — Ambulatory Visit (INDEPENDENT_AMBULATORY_CARE_PROVIDER_SITE_OTHER): Payer: Medicare Other | Admitting: Family Medicine

## 2012-10-29 ENCOUNTER — Encounter: Payer: Self-pay | Admitting: Family Medicine

## 2012-10-29 VITALS — BP 125/73 | HR 73 | Temp 98.0°F | Ht 76.0 in | Wt 178.2 lb

## 2012-10-29 DIAGNOSIS — E785 Hyperlipidemia, unspecified: Secondary | ICD-10-CM

## 2012-10-29 DIAGNOSIS — E119 Type 2 diabetes mellitus without complications: Secondary | ICD-10-CM

## 2012-10-29 DIAGNOSIS — I10 Essential (primary) hypertension: Secondary | ICD-10-CM

## 2012-10-29 DIAGNOSIS — I2510 Atherosclerotic heart disease of native coronary artery without angina pectoris: Secondary | ICD-10-CM

## 2012-10-29 LAB — COMPLETE METABOLIC PANEL WITH GFR
ALT: 12 U/L (ref 0–53)
AST: 15 U/L (ref 0–37)
Albumin: 4.6 g/dL (ref 3.5–5.2)
Alkaline Phosphatase: 66 U/L (ref 39–117)
BUN: 23 mg/dL (ref 6–23)
CO2: 26 mEq/L (ref 19–32)
Calcium: 10 mg/dL (ref 8.4–10.5)
Chloride: 103 mEq/L (ref 96–112)
Creat: 1.09 mg/dL (ref 0.50–1.35)
GFR, Est African American: 84 mL/min
GFR, Est Non African American: 72 mL/min
Glucose, Bld: 97 mg/dL (ref 70–99)
Potassium: 4.4 mEq/L (ref 3.5–5.3)
Sodium: 137 mEq/L (ref 135–145)
Total Bilirubin: 0.4 mg/dL (ref 0.3–1.2)
Total Protein: 7.2 g/dL (ref 6.0–8.3)

## 2012-10-29 LAB — POCT GLYCOSYLATED HEMOGLOBIN (HGB A1C): Hemoglobin A1C: 5.6

## 2012-10-29 MED ORDER — METFORMIN HCL 500 MG PO TABS: 500.0000 mg | ORAL_TABLET | Freq: Two times a day (BID) | ORAL | Status: DC

## 2012-10-29 MED ORDER — METOPROLOL TARTRATE 50 MG PO TABS: 50.0000 mg | ORAL_TABLET | Freq: Two times a day (BID) | ORAL | Status: DC

## 2012-10-29 MED ORDER — CLOPIDOGREL BISULFATE 75 MG PO TABS: 75.0000 mg | ORAL_TABLET | Freq: Every day | ORAL | Status: DC

## 2012-10-29 MED ORDER — GABAPENTIN 100 MG PO CAPS: 100.0000 mg | ORAL_CAPSULE | Freq: Three times a day (TID) | ORAL | Status: DC

## 2012-10-29 NOTE — Progress Notes (Signed)
  Subjective:    Patient ID: Franklin [name], male    DOB: November 01, 1950, 62 y.o.   MRN: [id number]  HPI  This 62 y.o. male presents for evaluation of diabetes, hyperlipidemia, And CAD.  He denies any c/o chest discomfort or SOB.  He has no Acute complaints. .  Review of Systems No chest pain, SOB, HA, dizziness, vision change, N/V, diarrhea, constipation, dysuria, urinary urgency or frequency, myalgias, arthralgias or rash.     Objective:   Physical Exam  Vital signs noted  Well developed well nourished male.  HEENT - Head atraumatic Normocephalic                Eyes - PERRLA, Conjuctiva - clear Sclera- Clear EOMI                Ears - EAC's Wnl TM's Wnl Gross Hearing WNL                Nose - Nares patent                 Throat - oropharanx wnl Respiratory - Lungs CTA bilateral Cardiac - RRR S1 and S2 without murmur GI - Abdomen soft Nontender and bowel sounds active x 4 Extremities - No edema. Neuro - Grossly intact.      Assessment & Plan:  HTN (hypertension) - Plan: COMPLETE METABOLIC PANEL WITH GFR, metoprolol (LOPRESSOR) 50 MG tablet  Diabetes - Plan: HgB A1c, Vitamin D 25 hydroxy, metFORMIN (GLUCOPHAGE) 500 MG tablet  Other and unspecified hyperlipidemia - Plan: NMR Lipoprofile with Lipids, gabapentin (NEURONTIN) 100 MG capsule  CAD (coronary artery disease) - Plan: clopidogrel (PLAVIX) 75 MG tablet

## 2012-10-30 LAB — VITAMIN D 25 HYDROXY (VIT D DEFICIENCY, FRACTURES): Vit D, 25-Hydroxy: 41 ng/mL (ref 30–89)

## 2012-10-30 LAB — NMR LIPOPROFILE WITH LIPIDS
Cholesterol, Total: 159 mg/dL (ref <200)
HDL Particle Number: 23.6 umol/L — ABNORMAL LOW (ref >=30.5)
HDL Size: 8.6 nm — ABNORMAL LOW (ref >=9.2)
HDL-C: 30 mg/dL — ABNORMAL LOW (ref >=40)
LDL (calc): 95 mg/dL (ref <100)
LDL Particle Number: 1142 nmol/L — ABNORMAL HIGH (ref <1000)
LDL Size: 20.3 nm — ABNORMAL LOW (ref >20.5)
LP-IR Score: 73 — ABNORMAL HIGH (ref <=45)
Large HDL-P: 3.4 umol/L — ABNORMAL LOW (ref >=4.8)
Large VLDL-P: 6.6 nmol/L — ABNORMAL HIGH (ref <=2.7)
Small LDL Particle Number: 750 nmol/L — ABNORMAL HIGH (ref <=527)
Triglycerides: 170 mg/dL — ABNORMAL HIGH (ref <150)
VLDL Size: 48 nm — ABNORMAL HIGH (ref <=46.6)

## 2012-11-18 NOTE — Patient Instructions (Signed)

## 2013-02-01 ENCOUNTER — Other Ambulatory Visit: Payer: Self-pay | Admitting: Family Medicine

## 2013-02-03 ENCOUNTER — Telehealth: Payer: Self-pay | Admitting: Family Medicine

## 2013-02-03 MED ORDER — GLUCOSE BLOOD VI STRP: ORAL_STRIP | Status: DC

## 2013-02-03 NOTE — Telephone Encounter (Signed)
Last seen 10/29/12 B [place] last glucose 10/29/12

## 2013-02-03 NOTE — Telephone Encounter (Signed)
done

## 2013-02-26 ENCOUNTER — Other Ambulatory Visit: Payer: Self-pay

## 2013-03-17 ENCOUNTER — Other Ambulatory Visit: Payer: Self-pay

## 2013-03-17 DIAGNOSIS — E785 Hyperlipidemia, unspecified: Secondary | ICD-10-CM

## 2013-03-17 MED ORDER — GABAPENTIN 100 MG PO CAPS: 100.0000 mg | ORAL_CAPSULE | Freq: Three times a day (TID) | ORAL | Status: DC

## 2013-03-24 ENCOUNTER — Encounter: Payer: Self-pay | Admitting: Cardiovascular Disease

## 2013-05-01 ENCOUNTER — Ambulatory Visit: Payer: Medicare Other | Admitting: Family Medicine

## 2013-05-20 ENCOUNTER — Telehealth: Payer: Self-pay | Admitting: Cardiology

## 2013-05-20 NOTE — Telephone Encounter (Signed)
Left message to call office to schedule appointment

## 2013-06-25 ENCOUNTER — Ambulatory Visit: Payer: Medicare Other | Admitting: Cardiology

## 2013-06-25 ENCOUNTER — Ambulatory Visit (INDEPENDENT_AMBULATORY_CARE_PROVIDER_SITE_OTHER): Payer: Medicare Other | Admitting: Cardiology

## 2013-06-25 ENCOUNTER — Encounter: Payer: Self-pay | Admitting: Cardiology

## 2013-06-25 VITALS — BP 120/78 | HR 74 | Ht 76.0 in | Wt 205.0 lb

## 2013-06-25 DIAGNOSIS — I10 Essential (primary) hypertension: Secondary | ICD-10-CM

## 2013-06-25 DIAGNOSIS — I2510 Atherosclerotic heart disease of native coronary artery without angina pectoris: Secondary | ICD-10-CM

## 2013-06-25 DIAGNOSIS — E785 Hyperlipidemia, unspecified: Secondary | ICD-10-CM

## 2013-06-25 NOTE — Patient Instructions (Signed)
Your physician recommends that you schedule a follow-up appointment in: 1 year with Dr. Branch. You should receive a letter in the mail in 10 months. If you do not receive this letter by January 2016 call our office to schedule this appointment.   Your physician recommends that you continue on your current medications as directed. Please refer to the Current Medication list given to you today.   

## 2013-06-25 NOTE — Progress Notes (Signed)
Clinical Summary Franklin [name] is a 63 y.o.male former patient of Dr Andee LinemaneGent, this is our first visit together. He is seen for the following medical problems.  1. CAD - prior stenting to LCX and RCA as described below. LVEF 55% by LV gram in 2009 - denies any recent chest pain, SOB, or DOE - compliant with meds, has been continued on indefinite plavix due to his heavy stent burden. He has a statin intolerance, and reported per family side effects to ACE and ARBSs   2. Hyperlipidemia - myalgias on multiple statins previously, currently not on therapy.   3. HTN - does not check regularly at home - compliant with meds  Past Medical History  Diagnosis Date  . Other and unspecified hyperlipidemia   . Coronary atherosclerosis of native coronary artery     History of inferior myocardial infarction, treated with   CYPHER stenting of circumflex and right coronary artery, February 2004.Status post non-ST segment myocardial infarction treated with PROMUS drug-eluting stenting of distal right coronary artery and posterior descending (coronary) artery, August 2009.  EF 55-60%. Non-ischemic exercise Myoview; EF 50%, October 2009.    . Type 2 diabetes mellitus   . Charcot-Marie-Tooth disease   . Peripheral neuropathy   . Statin intolerance     Severe myalgias  . Hearing loss      Allergies  Allergen Reactions  . Statins     REACTION: myalgia     Current Outpatient Prescriptions  Medication Sig Dispense Refill  . aspirin EC 81 MG tablet Take 81 mg by mouth daily.        . clopidogrel (PLAVIX) 75 MG tablet Take 1 tablet (75 mg total) by mouth daily.  30 tablet  11  . fish oil-omega-3 fatty acids 1000 MG capsule Take 2 g by mouth daily.        Marland Kitchen. gabapentin (NEURONTIN) 100 MG capsule Take 1 capsule (100 mg total) by mouth 3 (three) times daily.  270 capsule  0  . glucose blood (FREESTYLE LITE) test strip Test two times daily  100 each  0  . glucose blood test strip Use as instructed   100 each  12  . metFORMIN (GLUCOPHAGE) 500 MG tablet Take 1 tablet (500 mg total) by mouth 2 (two) times daily with a meal.  180 tablet  3  . metoprolol (LOPRESSOR) 50 MG tablet Take 1 tablet (50 mg total) by mouth 2 (two) times daily.  180 tablet  3   No current facility-administered medications for this visit.     No past surgical history on file.   Allergies  Allergen Reactions  . Statins     REACTION: myalgia      No family history on file.   Social History Franklin [name] reports that he quit smoking about 8 years ago. His smoking use included Cigarettes. He has a 80 pack-year smoking history. He has never used smokeless tobacco. Franklin [name] reports that he does not drink alcohol.   Review of Systems CONSTITUTIONAL: No weight loss, fever, chills, weakness or fatigue.  HEENT: Eyes: No visual loss, blurred vision, double vision or yellow sclerae.No hearing loss, sneezing, congestion, runny nose or sore throat.  SKIN: No rash or itching.  CARDIOVASCULAR: per HPI RESPIRATORY: No shortness of breath, cough or sputum.  GASTROINTESTINAL: No anorexia, nausea, vomiting or diarrhea. No abdominal pain or blood.  GENITOURINARY: No burning on urination, no polyuria NEUROLOGICAL: No headache, dizziness, syncope, paralysis, ataxia, numbness or tingling in the  extremities. No change in bowel or bladder control.  MUSCULOSKELETAL: No muscle, back pain, joint pain or stiffness.  LYMPHATICS: No enlarged nodes. No history of splenectomy.  PSYCHIATRIC: No history of depression or anxiety.  ENDOCRINOLOGIC: No reports of sweating, cold or heat intolerance. No polyuria or polydipsia.  Marland Kitchen   Physical Examination p 74 bp 120/78 wt 205 lbs BMI 25 Gen: resting comfortably, no acute distress HEENT: no scleral icterus, pupils equal round and reactive, no palptable cervical adenopathy,  CV: RRR, no m/r/g, no JVD, no carotid bruits Resp: Clear to auscultation bilaterally GI: abdomen is soft,  non-tender, non-distended, normal bowel sounds, no hepatosplenomegaly MSK: extremities are warm, no edema.  Skin: warm, no rash Neuro:  no focal deficits Psych: appropriate affect   Diagnostic Studies 11/2007 Cath Central aortic pressure 135/71 with a mean of 95. LV pressure was 132  with an EDP of 16. There was no aortic stenosis.  Left main had distal 20% stenosis.  LAD coursed through the apex gave off two diagonal branches. There was  a 60-70% proximal lesion in the LAD. This was smooth. There was mild  20-30% diffuse disease distally. In the small first diagonal there was  a 40-50% focal stenosis.  Left circumflex gave off an OM-1 and OM-2. There was a stent in the  proximal portion of the left circumflex. Prior to the stent, there was  a 40% stenosis in the mid portion of the left circ. At the takeoff of  the OM-1, there was 70% focal stenosis.  Right coronary artery was a large dominant vessel and had a long area of  diffuse stenting from the proximal portion and almost down to the distal  bend. There was mild diffuse disease within the stents, but the stents  were patent with good flow. Right after the stent, there was a 40-50%  focal stenosis. Distal to this, right before the PDA, there was a 90%  focal stenosis. In the mid to distal PDA, there was a 40% stenosis.  Left ventriculogram done in the RAO position showed an EF of 55-60% with  no regional wall motion abnormalities.  ASSESSMENT:  1. Three-vessel coronary artery disease as described above.  2. The right coronary artery and left circumflex stents are patent.  3. There is a high-grade lesion in the distal right coronary artery.  4. Normal left ventricular function.  PLAN: For percutaneous intervention on the distal right coronary artery  by Dr. Clifton James. He will need aggressive risk factor management.  IMPRESSION: Successful percutaneous coronary intervention of the distal  right coronary artery and posterior  descending artery with placement of  2 Promus drug-eluting stents.     Assessment and Plan  1. CAD - no current symptoms, continue current therapy - indefinite DAPT per prior interventional notes  2. HTN - at goal, continue current meds  3. Hyperlipidemia - intolerant to statins, continue fish oil      Antoine Poche, M.D., F.A.C.C.

## 2013-07-07 ENCOUNTER — Telehealth: Payer: Self-pay | Admitting: Family Medicine

## 2013-07-07 NOTE — Telephone Encounter (Signed)
Last seen 10/29/12

## 2013-07-08 ENCOUNTER — Other Ambulatory Visit: Payer: Self-pay | Admitting: *Deleted

## 2013-07-08 ENCOUNTER — Other Ambulatory Visit: Payer: Self-pay | Admitting: Family Medicine

## 2013-07-08 MED ORDER — FREESTYLE LANCETS MISC: Status: DC

## 2013-07-08 NOTE — Telephone Encounter (Signed)
Last aic 5.6 on 7/14. ntbs

## 2013-07-08 NOTE — Telephone Encounter (Signed)
Lancets sent in [facility]

## 2013-07-10 MED ORDER — GLUCOSE BLOOD VI STRP: ORAL_STRIP | Status: DC

## 2013-07-10 MED ORDER — FREESTYLE LANCETS MISC: Status: DC

## 2013-07-11 ENCOUNTER — Telehealth: Payer: Self-pay | Admitting: Family Medicine

## 2013-07-13 ENCOUNTER — Other Ambulatory Visit: Payer: Self-pay | Admitting: *Deleted

## 2013-07-13 DIAGNOSIS — E119 Type 2 diabetes mellitus without complications: Secondary | ICD-10-CM

## 2013-07-13 MED ORDER — GLUCOSE BLOOD VI STRP: ORAL_STRIP | Status: DC

## 2013-07-13 MED ORDER — ONETOUCH LANCETS MISC: 1.0000 | Freq: Two times a day (BID) | Status: DC

## 2013-10-16 ENCOUNTER — Telehealth: Payer: Self-pay | Admitting: Family Medicine

## 2013-10-16 ENCOUNTER — Other Ambulatory Visit: Payer: Self-pay | Admitting: Family Medicine

## 2013-10-16 NOTE — Telephone Encounter (Signed)
appt scheduled

## 2013-10-19 NOTE — Telephone Encounter (Signed)
Has appt with you tomorrow. Please advise on refill

## 2013-10-20 ENCOUNTER — Encounter: Payer: Self-pay | Admitting: Family Medicine

## 2013-10-20 ENCOUNTER — Ambulatory Visit (INDEPENDENT_AMBULATORY_CARE_PROVIDER_SITE_OTHER): Payer: Medicare Other | Admitting: Family Medicine

## 2013-10-20 VITALS — BP 108/73 | HR 83 | Temp 97.9°F | Ht 76.0 in | Wt 190.8 lb

## 2013-10-20 DIAGNOSIS — N058 Unspecified nephritic syndrome with other morphologic changes: Secondary | ICD-10-CM

## 2013-10-20 DIAGNOSIS — E1121 Type 2 diabetes mellitus with diabetic nephropathy: Secondary | ICD-10-CM

## 2013-10-20 DIAGNOSIS — G609 Hereditary and idiopathic neuropathy, unspecified: Secondary | ICD-10-CM

## 2013-10-20 DIAGNOSIS — E1129 Type 2 diabetes mellitus with other diabetic kidney complication: Secondary | ICD-10-CM

## 2013-10-20 DIAGNOSIS — I2510 Atherosclerotic heart disease of native coronary artery without angina pectoris: Secondary | ICD-10-CM

## 2013-10-20 DIAGNOSIS — I2583 Coronary atherosclerosis due to lipid rich plaque: Secondary | ICD-10-CM

## 2013-10-20 LAB — POCT CBC
Granulocyte percent: 66.9 %G (ref 37–80)
HCT, POC: 45.6 % (ref 43.5–53.7)
Hemoglobin: 15.1 g/dL (ref 14.1–18.1)
Lymph, poc: 2.5 (ref 0.6–3.4)
MCH, POC: 30.3 pg (ref 27–31.2)
MCHC: 33.2 g/dL (ref 31.8–35.4)
MCV: 91.3 fL (ref 80–97)
MPV: 7.4 fL (ref 0–99.8)
POC Granulocyte: 5.6 (ref 2–6.9)
POC LYMPH PERCENT: 29.6 %L (ref 10–50)
Platelet Count, POC: 254 10*3/uL (ref 142–424)
RBC: 5 M/uL (ref 4.69–6.13)
RDW, POC: 13.1 %
WBC: 8.4 10*3/uL (ref 4.6–10.2)

## 2013-10-20 MED ORDER — CLOPIDOGREL BISULFATE 75 MG PO TABS: 75.0000 mg | ORAL_TABLET | Freq: Every day | ORAL | Status: DC

## 2013-10-20 MED ORDER — GABAPENTIN 100 MG PO CAPS: ORAL_CAPSULE | ORAL | Status: DC

## 2013-10-20 MED ORDER — METFORMIN HCL 500 MG PO TABS: 500.0000 mg | ORAL_TABLET | Freq: Two times a day (BID) | ORAL | Status: DC

## 2013-10-20 NOTE — Progress Notes (Signed)
   Subjective:    Patient ID: Franklin [name], male    DOB: [birth year], 63 y.o.   MRN: [id number]  HPI This 63 y.o. male presents for evaluation of routine follow up.  He has hx of CAD, diabetes, peripheral neuropathy, and hyperlipidemia.  He is intolerant to statins.  He sees cardiology once a year.  He  Has deafness and his wife who accompanies helps with hx.   Review of Systems No chest pain, SOB, HA, dizziness, vision change, N/V, diarrhea, constipation, dysuria, urinary urgency or frequency, myalgias, arthralgias or rash.     Objective:   Physical Exam  Vital signs noted  Well developed well nourished male.  HEENT - Head atraumatic Normocephalic                Eyes - PERRLA, Conjuctiva - clear Sclera- Clear EOMI                Ears - EAC's Wnl TM's Wnl and HOH bilateral                Nose - Nares patent                 Throat - oropharanx wnl Respiratory - Lungs CTA bilateral Cardiac - RRR S1 and S2 without murmur GI - Abdomen soft Nontender and bowel sounds active x 4 Extremities - No edema. Neuro - Grossly intact.      Assessment & Plan:  Unspecified hereditary and idiopathic peripheral neuropathy - Plan: gabapentin (NEURONTIN) 100 MG capsule, Thyroid Panel With TSH  Type 2 diabetes mellitus with diabetic nephropathy - Plan: metFORMIN (GLUCOPHAGE) 500 MG tablet, gabapentin (NEURONTIN) 100 MG capsule, POCT CBC, CMP14+EGFR, Lipid panel, PSA, total and free, Thyroid Panel With TSH  Coronary artery disease due to lipid rich plaque - Plan: clopidogrel (PLAVIX) 75 MG tablet  Follow up in 3 months  Lysbeth Penner FNP

## 2013-10-21 LAB — CMP14+EGFR
ALT: 30 IU/L (ref 0–44)
AST: 21 IU/L (ref 0–40)
Albumin/Globulin Ratio: 2.4 (ref 1.1–2.5)
Albumin: 5 g/dL — ABNORMAL HIGH (ref 3.6–4.8)
Alkaline Phosphatase: 53 IU/L (ref 39–117)
BUN/Creatinine Ratio: 18 (ref 10–22)
BUN: 23 mg/dL (ref 8–27)
CO2: 20 mmol/L (ref 18–29)
Calcium: 9.6 mg/dL (ref 8.6–10.2)
Chloride: 100 mmol/L (ref 97–108)
Creatinine, Ser: 1.25 mg/dL (ref 0.76–1.27)
GFR calc Af Amer: 70 mL/min/{1.73_m2} (ref >59)
GFR calc non Af Amer: 61 mL/min/{1.73_m2} (ref >59)
Globulin, Total: 2.1 g/dL (ref 1.5–4.5)
Glucose: 89 mg/dL (ref 65–99)
Potassium: 4.5 mmol/L (ref 3.5–5.2)
Sodium: 143 mmol/L (ref 134–144)
Total Bilirubin: 0.4 mg/dL (ref 0.0–1.2)
Total Protein: 7.1 g/dL (ref 6.0–8.5)

## 2013-10-21 LAB — LIPID PANEL
Chol/HDL Ratio: 4.8 ratio units (ref 0.0–5.0)
Cholesterol, Total: 153 mg/dL (ref 100–199)
HDL: 32 mg/dL — ABNORMAL LOW (ref >39)
LDL Calculated: 83 mg/dL (ref 0–99)
Triglycerides: 189 mg/dL — ABNORMAL HIGH (ref 0–149)
VLDL Cholesterol Cal: 38 mg/dL (ref 5–40)

## 2013-10-21 LAB — THYROID PANEL WITH TSH
Free Thyroxine Index: 1.7 (ref 1.2–4.9)
T3 Uptake Ratio: 30 % (ref 24–39)
T4, Total: 5.5 ug/dL (ref 4.5–12.0)
TSH: 2.28 u[IU]/mL (ref 0.450–4.500)

## 2013-10-21 LAB — PSA, TOTAL AND FREE
PSA, Free Pct: 33.6 %
PSA, Free: 0.37 ng/mL
PSA: 1.1 ng/mL (ref 0.0–4.0)

## 2014-01-18 ENCOUNTER — Telehealth: Payer: Self-pay | Admitting: Family Medicine

## 2014-01-18 NOTE — Telephone Encounter (Signed)
This is okay for Transderm scopolamine

## 2014-01-18 NOTE — Telephone Encounter (Signed)
Pt needs scopolamine patch for upcoming trip on Thursday Please advise

## 2014-01-19 MED ORDER — SCOPOLAMINE 1 MG/3DAYS TD PT72: 1.0000 | MEDICATED_PATCH | TRANSDERMAL | Status: DC

## 2014-01-19 NOTE — Telephone Encounter (Signed)
Pt aware and patches were ordered

## 2014-02-05 ENCOUNTER — Other Ambulatory Visit: Payer: Self-pay

## 2014-04-30 ENCOUNTER — Other Ambulatory Visit: Payer: Self-pay | Admitting: *Deleted

## 2014-04-30 DIAGNOSIS — I2510 Atherosclerotic heart disease of native coronary artery without angina pectoris: Secondary | ICD-10-CM

## 2014-04-30 DIAGNOSIS — I2583 Coronary atherosclerosis due to lipid rich plaque: Principal | ICD-10-CM

## 2014-04-30 MED ORDER — CLOPIDOGREL BISULFATE 75 MG PO TABS: 75.0000 mg | ORAL_TABLET | Freq: Every day | ORAL | Status: DC

## 2014-04-30 NOTE — Telephone Encounter (Signed)
Pt requesting 90 day supply on meds. He had refill that lasted till end of June, rx for 90 day supply with 1 refill sent to pharmacy.

## 2014-05-03 ENCOUNTER — Other Ambulatory Visit: Payer: Self-pay | Admitting: *Deleted

## 2014-05-03 DIAGNOSIS — I2510 Atherosclerotic heart disease of native coronary artery without angina pectoris: Secondary | ICD-10-CM

## 2014-05-03 DIAGNOSIS — I2583 Coronary atherosclerosis due to lipid rich plaque: Principal | ICD-10-CM

## 2014-05-03 MED ORDER — CLOPIDOGREL BISULFATE 75 MG PO TABS: 75.0000 mg | ORAL_TABLET | Freq: Every day | ORAL | Status: DC

## 2014-05-03 NOTE — Telephone Encounter (Signed)
Last seen 09/2013, wants 90 day supply

## 2014-07-12 ENCOUNTER — Encounter: Payer: Self-pay | Admitting: Cardiology

## 2014-07-12 ENCOUNTER — Ambulatory Visit (INDEPENDENT_AMBULATORY_CARE_PROVIDER_SITE_OTHER): Payer: Medicare Other | Admitting: Cardiology

## 2014-07-12 VITALS — BP 125/80 | HR 69 | Ht 76.0 in | Wt 189.0 lb

## 2014-07-12 DIAGNOSIS — E785 Hyperlipidemia, unspecified: Secondary | ICD-10-CM

## 2014-07-12 DIAGNOSIS — I2510 Atherosclerotic heart disease of native coronary artery without angina pectoris: Secondary | ICD-10-CM

## 2014-07-12 DIAGNOSIS — I10 Essential (primary) hypertension: Secondary | ICD-10-CM

## 2014-07-12 NOTE — Patient Instructions (Signed)
Continue all current medications. Your physician wants you to follow up in: 6 months.  You will receive a reminder letter in the mail one-two months in advance.  If you don't receive a letter, please call our office to schedule the follow up appointment   

## 2014-07-12 NOTE — Progress Notes (Signed)
Clinical Summary Franklin [name] is a 64 y.o.male seen today for follow up of the following medical problems  1. CAD - prior stenting to LCX and RCA as described below. LVEF 55% by LV gram in 200 - compliant with meds, has been continued on indefinite plavix due to his heavy stent burden. He has a statin intolerance, and reported per family side effects to ACE and ARBSs - no chest pain, no SOB or DOE since last visit.   2. Hyperlipidemia - myalgias on multiple statins previously, currently not on therapy.  - last lipid panel 09/2013 TC 153 TG 189 HDL 32 LDL 83  3. HTN - does not check regularly at home - compliant with meds  4. DM2 - followed by pcp  Past Medical History  Diagnosis Date  . Other and unspecified hyperlipidemia   . Coronary atherosclerosis of native coronary artery     History of inferior myocardial infarction, treated with   CYPHER stenting of circumflex and right coronary artery, February 2004.Status post non-ST segment myocardial infarction treated with PROMUS drug-eluting stenting of distal right coronary artery and posterior descending (coronary) artery, August 2009.  EF 55-60%. Non-ischemic exercise Myoview; EF 50%, October 2009.    . Type 2 diabetes mellitus   . Charcot-Marie-Tooth disease   . Peripheral neuropathy   . Statin intolerance     Severe myalgias  . Hearing loss      Allergies  Allergen Reactions  . Statins     REACTION: myalgia     Current Outpatient Prescriptions  Medication Sig Dispense Refill  . Ascorbic Acid (VITAMIN C) 1000 MG tablet Take 500 mg by mouth.    Marland Kitchen. aspirin EC 81 MG tablet Take 81 mg by mouth daily.      . Calcium 500-125 MG-UNIT TABS Take 2 tablets by mouth.    . clopidogrel (PLAVIX) 75 MG tablet Take 1 tablet (75 mg total) by mouth daily. 90 tablet 0  . fish oil-omega-3 fatty acids 1000 MG capsule Take 2 g by mouth daily.      Marland Kitchen. gabapentin (NEURONTIN) 100 MG capsule TAKE ONE CAPSULE BY MOUTH 3 TIMES A DAY 270  capsule 0  . glucose blood (ONE TOUCH TEST STRIPS) test strip Use as instructed 100 each 12  . metFORMIN (GLUCOPHAGE) 500 MG tablet Take 1 tablet (500 mg total) by mouth 2 (two) times daily with a meal. 180 tablet 3  . metoprolol (LOPRESSOR) 50 MG tablet Take 1 tablet (50 mg total) by mouth 2 (two) times daily. 180 tablet 3  . ONE TOUCH LANCETS MISC 1 each by Does not apply route 2 (two) times daily. 100 each 11  . scopolamine (TRANSDERM-SCOP) 1 MG/3DAYS Place 1 patch (1.5 mg total) onto the skin every 3 (three) days. 10 patch 2  . vitamin C (ASCORBIC ACID) 500 MG tablet Take 500 mg by mouth daily.     No current facility-administered medications for this visit.     No past surgical history on file.   Allergies  Allergen Reactions  . Statins     REACTION: myalgia      No family history on file.   Social History Franklin [name] reports that he quit smoking about 9 years ago. His smoking use included Cigarettes. He has a 80 pack-year smoking history. He has never used smokeless tobacco. Franklin [name] reports that he does not drink alcohol.   Review of Systems CONSTITUTIONAL: No weight loss, fever, chills, weakness or fatigue.  HEENT:  Eyes: No visual loss, blurred vision, double vision or yellow sclerae.No hearing loss, sneezing, congestion, runny nose or sore throat.  SKIN: No rash or itching.  CARDIOVASCULAR: per HPI RESPIRATORY: No shortness of breath, cough or sputum.  GASTROINTESTINAL: No anorexia, nausea, vomiting or diarrhea. No abdominal pain or blood.  GENITOURINARY: No burning on urination, no polyuria NEUROLOGICAL: No headache, dizziness, syncope, paralysis, ataxia, numbness or tingling in the extremities. No change in bowel or bladder control.  MUSCULOSKELETAL: No muscle, back pain, joint pain or stiffness.  LYMPHATICS: No enlarged nodes. No history of splenectomy.  PSYCHIATRIC: No history of depression or anxiety.  ENDOCRINOLOGIC: No reports of sweating, cold or  heat intolerance. No polyuria or polydipsia.  Marland Kitchen   Physical Examination p 69 bp 125/80 Wt 189 lbs BMI 23 Gen: resting comfortably, no acute distress HEENT: no scleral icterus, pupils equal round and reactive, no palptable cervical adenopathy,  CV: RRR, no m/r/g, no JVD Resp: Clear to auscultation bilaterally GI: abdomen is soft, non-tender, non-distended, normal bowel sounds, no hepatosplenomegaly MSK: extremities are warm, no edema.  Skin: warm, no rash Neuro:  no focal deficits Psych: appropriate affect   Diagnostic Studies 11/2007 Cath Central aortic pressure 135/71 with a mean of 95. LV pressure was 132  with an EDP of 16. There was no aortic stenosis.  Left main had distal 20% stenosis.  LAD coursed through the apex gave off two diagonal branches. There was  a 60-70% proximal lesion in the LAD. This was smooth. There was mild  20-30% diffuse disease distally. In the small first diagonal there was  a 40-50% focal stenosis.  Left circumflex gave off an OM-1 and OM-2. There was a stent in the  proximal portion of the left circumflex. Prior to the stent, there was  a 40% stenosis in the mid portion of the left circ. At the takeoff of  the OM-1, there was 70% focal stenosis.  Right coronary artery was a large dominant vessel and had a long area of  diffuse stenting from the proximal portion and almost down to the distal  bend. There was mild diffuse disease within the stents, but the stents  were patent with good flow. Right after the stent, there was a 40-50%  focal stenosis. Distal to this, right before the PDA, there was a 90%  focal stenosis. In the mid to distal PDA, there was a 40% stenosis.  Left ventriculogram done in the RAO position showed an EF of 55-60% with  no regional wall motion abnormalities.  ASSESSMENT:  1. Three-vessel coronary artery disease as described above.  2. The right coronary artery and left circumflex stents are patent.  3.  There is a high-grade lesion in the distal right coronary artery.  4. Normal left ventricular function.  PLAN: For percutaneous intervention on the distal right coronary artery  by Dr. Clifton James. He will need aggressive risk factor management.  IMPRESSION: Successful percutaneous coronary intervention of the distal  right coronary artery and posterior descending artery with placement of  2 Promus drug-eluting stents.    Assessment and Plan  1. CAD - no current symptoms, continue current therapy - indefinite DAPT per prior interventional notes  2. HTN - at goal, continue current meds  3. Hyperlipidemia - intolerant to statins, continue fish oil and dietary changes   F/u 6 months    Antoine Poche, M.D.

## 2014-07-30 ENCOUNTER — Other Ambulatory Visit: Payer: Self-pay | Admitting: Family Medicine

## 2014-10-13 ENCOUNTER — Ambulatory Visit (INDEPENDENT_AMBULATORY_CARE_PROVIDER_SITE_OTHER): Payer: Medicare Other | Admitting: Family Medicine

## 2014-10-13 ENCOUNTER — Encounter: Payer: Self-pay | Admitting: Family Medicine

## 2014-10-13 VITALS — BP 115/78 | HR 85 | Temp 98.0°F | Ht 76.0 in | Wt 186.0 lb

## 2014-10-13 DIAGNOSIS — G609 Hereditary and idiopathic neuropathy, unspecified: Secondary | ICD-10-CM

## 2014-10-13 DIAGNOSIS — I2510 Atherosclerotic heart disease of native coronary artery without angina pectoris: Secondary | ICD-10-CM

## 2014-10-13 DIAGNOSIS — N400 Enlarged prostate without lower urinary tract symptoms: Secondary | ICD-10-CM

## 2014-10-13 DIAGNOSIS — G629 Polyneuropathy, unspecified: Secondary | ICD-10-CM | POA: Diagnosis not present

## 2014-10-13 DIAGNOSIS — E1121 Type 2 diabetes mellitus with diabetic nephropathy: Secondary | ICD-10-CM | POA: Diagnosis not present

## 2014-10-13 DIAGNOSIS — I10 Essential (primary) hypertension: Secondary | ICD-10-CM

## 2014-10-13 DIAGNOSIS — E119 Type 2 diabetes mellitus without complications: Secondary | ICD-10-CM | POA: Insufficient documentation

## 2014-10-13 DIAGNOSIS — I2583 Coronary atherosclerosis due to lipid rich plaque: Secondary | ICD-10-CM

## 2014-10-13 DIAGNOSIS — E785 Hyperlipidemia, unspecified: Secondary | ICD-10-CM | POA: Diagnosis not present

## 2014-10-13 LAB — POCT GLYCOSYLATED HEMOGLOBIN (HGB A1C): HEMOGLOBIN A1C: 5.7

## 2014-10-13 LAB — POCT UA - MICROALBUMIN: MICROALBUMIN (UR) POC: 20 mg/L

## 2014-10-13 MED ORDER — CLOPIDOGREL BISULFATE 75 MG PO TABS: 75.0000 mg | ORAL_TABLET | Freq: Every day | ORAL | Status: DC

## 2014-10-13 MED ORDER — GABAPENTIN 100 MG PO CAPS: ORAL_CAPSULE | ORAL | Status: DC

## 2014-10-13 MED ORDER — METFORMIN HCL 500 MG PO TABS: 500.0000 mg | ORAL_TABLET | Freq: Two times a day (BID) | ORAL | Status: DC

## 2014-10-13 MED ORDER — METOPROLOL TARTRATE 50 MG PO TABS: 50.0000 mg | ORAL_TABLET | Freq: Two times a day (BID) | ORAL | Status: DC

## 2014-10-13 NOTE — Progress Notes (Signed)
Subjective:    Patient ID: Franklin [name], male    DOB: [birth year], 64 y.o.   MRN: [id number]  HPI 64 year old man who is here to follow-up diabetes hyperlipidemia and coronary artery disease. He is followed yearly by cardiology. He has a history of 5 stents. He denies chest pain.  In monitoring his sugar at home he says fasting sugars are generally less than 120. He seems to do pre-well in terms of compliance with diet. We talked some today about initiating ACE inhibitor for renal protection but he states that he is not interested there is a history of statin intolerance but in reviewing his labs from last year his LDL is less than 100 so I would not push that issue trying to one of the newer statins    Review of Systems  Constitutional: Negative.   HENT: Negative.   Respiratory: Negative.   Cardiovascular: Negative.   Neurological: Negative.   Psychiatric/Behavioral: Negative.    Patient Active Problem List   Diagnosis Date Noted  . Statin intolerance   . Peripheral neuropathy   . Hyperlipidemia 01/05/2009  . CAD, NATIVE VESSEL 01/05/2009   Outpatient Encounter Prescriptions as of 10/13/2014  Medication Sig  . Ascorbic Acid (VITAMIN C) 1000 MG tablet Take 500 mg by mouth.  Marland Kitchen aspirin EC 81 MG tablet Take 81 mg by mouth daily.    . Calcium 500-125 MG-UNIT TABS Take 2 tablets by mouth.  . clopidogrel (PLAVIX) 75 MG tablet Take 1 tablet (75 mg total) by mouth daily.  . fish oil-omega-3 fatty acids 1000 MG capsule Take 2 g by mouth daily.    Marland Kitchen gabapentin (NEURONTIN) 100 MG capsule TAKE ONE CAPSULE BY MOUTH 3 TIMES A DAY  . glucose blood (ONE TOUCH TEST STRIPS) test strip Use as instructed  . metFORMIN (GLUCOPHAGE) 500 MG tablet Take 1 tablet (500 mg total) by mouth 2 (two) times daily with a meal.  . metoprolol (LOPRESSOR) 50 MG tablet Take 1 tablet (50 mg total) by mouth 2 (two) times daily.  . ONE TOUCH LANCETS MISC 1 each by Does not apply route 2 (two) times daily.  . vitamin  C (ASCORBIC ACID) 500 MG tablet Take 500 mg by mouth daily.   No facility-administered encounter medications on file as of 10/13/2014.       Objective:   Physical Exam  Constitutional: He is oriented to person, place, and time. He appears well-developed and well-nourished.  Cardiovascular: Normal rate and regular rhythm.   Pulmonary/Chest: Effort normal and breath sounds normal.  Musculoskeletal: Normal range of motion.  Neurological: He is alert and oriented to person, place, and time.          Assessment & Plan:  1. Hyperlipidemia Would not a candidate for statin therapy based on past history - Lipid panel  2. Peripheral neuropathy Symptoms are fairly well controlled with Neurontin at low dose and he is not interested in increasing dose  3. Essential hypertension Metoprolol is primarily for coronary disease blood pressure is good - metoprolol (LOPRESSOR) 50 MG tablet; Take 1 tablet (50 mg total) by mouth 2 (two) times daily.  Dispense: 60 tablet; Refill: 0  4. Type 2 diabetes mellitus with diabetic nephropathy Result been well controlled previously will likely continue metformin last A1c is elevated - metFORMIN (GLUCOPHAGE) 500 MG tablet; Take 1 tablet (500 mg total) by mouth 2 (two) times daily with a meal.  Dispense: 60 tablet; Refill: 0 - gabapentin (NEURONTIN) 100 MG capsule; TAKE ONE  CAPSULE BY MOUTH 3 TIMES A DAY  Dispense: 90 capsule; Refill: 0 - CMP14+EGFR - POCT UA - Microalbumin - POCT glycosylated hemoglobin (Hb A1C)  5. Hereditary and idiopathic peripheral neuropathy See above for neuropathy - gabapentin (NEURONTIN) 100 MG capsule; TAKE ONE CAPSULE BY MOUTH 3 TIMES A DAY  Dispense: 90 capsule; Refill: 0  6. Coronary artery disease due to lipid rich plaque Plavix as needed because of stents - clopidogrel (PLAVIX) 75 MG tablet; Take 1 tablet (75 mg total) by mouth daily.  Dispense: 30 tablet; Refill: 0  7. BPH (benign prostatic hypertrophy) I believe PSA  should be checked and most man with enlarged prostate - PSA, total and free   Wardell Honour MD

## 2014-10-14 LAB — CMP14+EGFR
ALK PHOS: 53 IU/L (ref 39–117)
ALT: 24 IU/L (ref 0–44)
AST: 22 IU/L (ref 0–40)
Albumin/Globulin Ratio: 1.8 (ref 1.1–2.5)
Albumin: 4.8 g/dL (ref 3.6–4.8)
BUN / CREAT RATIO: 20 (ref 10–22)
BUN: 24 mg/dL (ref 8–27)
Bilirubin Total: 0.6 mg/dL (ref 0.0–1.2)
CALCIUM: 10.3 mg/dL — AB (ref 8.6–10.2)
CHLORIDE: 99 mmol/L (ref 97–108)
CO2: 23 mmol/L (ref 18–29)
Creatinine, Ser: 1.23 mg/dL (ref 0.76–1.27)
GFR calc Af Amer: 71 mL/min/{1.73_m2} (ref >59)
GFR calc non Af Amer: 62 mL/min/{1.73_m2} (ref >59)
Globulin, Total: 2.6 g/dL (ref 1.5–4.5)
Glucose: 115 mg/dL — ABNORMAL HIGH (ref 65–99)
Potassium: 4.6 mmol/L (ref 3.5–5.2)
SODIUM: 140 mmol/L (ref 134–144)
Total Protein: 7.4 g/dL (ref 6.0–8.5)

## 2014-10-14 LAB — PSA, TOTAL AND FREE
PROSTATE SPECIFIC AG, SERUM: 1.3 ng/mL (ref 0.0–4.0)
PSA, Free Pct: 46.2 %
PSA, Free: 0.6 ng/mL

## 2014-10-14 LAB — LIPID PANEL
CHOL/HDL RATIO: 4.5 ratio (ref 0.0–5.0)
Cholesterol, Total: 148 mg/dL (ref 100–199)
HDL: 33 mg/dL — ABNORMAL LOW (ref >39)
LDL Calculated: 62 mg/dL (ref 0–99)
Triglycerides: 265 mg/dL — ABNORMAL HIGH (ref 0–149)
VLDL Cholesterol Cal: 53 mg/dL — ABNORMAL HIGH (ref 5–40)

## 2014-10-14 LAB — MICROALBUMIN, URINE: MICROALBUM., U, RANDOM: 98.1 ug/mL

## 2014-10-18 ENCOUNTER — Other Ambulatory Visit: Payer: Self-pay

## 2014-11-19 ENCOUNTER — Other Ambulatory Visit: Payer: Self-pay | Admitting: Family Medicine

## 2014-11-19 DIAGNOSIS — G609 Hereditary and idiopathic neuropathy, unspecified: Secondary | ICD-10-CM

## 2014-11-19 DIAGNOSIS — I2510 Atherosclerotic heart disease of native coronary artery without angina pectoris: Secondary | ICD-10-CM

## 2014-11-19 DIAGNOSIS — E1121 Type 2 diabetes mellitus with diabetic nephropathy: Secondary | ICD-10-CM

## 2014-11-19 DIAGNOSIS — I2583 Coronary atherosclerosis due to lipid rich plaque: Principal | ICD-10-CM

## 2014-11-19 MED ORDER — GABAPENTIN 100 MG PO CAPS: ORAL_CAPSULE | ORAL | Status: DC

## 2014-11-19 MED ORDER — CLOPIDOGREL BISULFATE 75 MG PO TABS: 75.0000 mg | ORAL_TABLET | Freq: Every day | ORAL | Status: DC

## 2014-11-19 MED ORDER — METFORMIN HCL 500 MG PO TABS: 500.0000 mg | ORAL_TABLET | Freq: Two times a day (BID) | ORAL | Status: DC

## 2014-11-19 NOTE — Telephone Encounter (Signed)
done

## 2014-12-19 ENCOUNTER — Other Ambulatory Visit: Payer: Self-pay | Admitting: Family Medicine

## 2015-02-22 ENCOUNTER — Other Ambulatory Visit: Payer: Self-pay | Admitting: Family Medicine

## 2015-03-09 ENCOUNTER — Other Ambulatory Visit: Payer: Self-pay | Admitting: Family Medicine

## 2015-03-29 ENCOUNTER — Telehealth: Payer: Self-pay | Admitting: Family Medicine

## 2015-03-29 NOTE — Telephone Encounter (Signed)
denied °

## 2015-04-27 ENCOUNTER — Other Ambulatory Visit: Payer: Self-pay | Admitting: Family Medicine

## 2015-04-28 ENCOUNTER — Ambulatory Visit (INDEPENDENT_AMBULATORY_CARE_PROVIDER_SITE_OTHER): Payer: Medicare Other | Admitting: Cardiology

## 2015-04-28 ENCOUNTER — Encounter: Payer: Self-pay | Admitting: Cardiology

## 2015-04-28 VITALS — BP 126/74 | HR 66 | Ht 76.0 in | Wt 186.0 lb

## 2015-04-28 DIAGNOSIS — I2510 Atherosclerotic heart disease of native coronary artery without angina pectoris: Secondary | ICD-10-CM

## 2015-04-28 DIAGNOSIS — Z139 Encounter for screening, unspecified: Secondary | ICD-10-CM

## 2015-04-28 DIAGNOSIS — I10 Essential (primary) hypertension: Secondary | ICD-10-CM | POA: Diagnosis not present

## 2015-04-28 DIAGNOSIS — E785 Hyperlipidemia, unspecified: Secondary | ICD-10-CM | POA: Diagnosis not present

## 2015-04-28 MED ORDER — LOSARTAN POTASSIUM 25 MG PO TABS: 12.5000 mg | ORAL_TABLET | Freq: Every day | ORAL | Status: DC

## 2015-04-28 NOTE — Patient Instructions (Signed)
Your physician has requested that you have an abdominal aorta duplex. During this test, an ultrasound is used to evaluate the aorta. Allow 30 minutes for this exam. Do not eat after midnight the day before and avoid carbonated beverages Lab for BMET - due in 2 weeks, around 05/12/2015. Office will contact with results via phone or letter.   Begin Losartan 12.5mg  daily (1/2 tab of 25mg  tablet) - [facility].  If tolerate, contact office when wanting to send to mail order. Continue all other medications.   Your physician wants you to follow up in:  1 year.  You will receive a reminder letter in the mail one-two months in advance.  If you don't receive a letter, please call our office to schedule the follow up appointment

## 2015-04-28 NOTE — Progress Notes (Signed)
Patient ID: Franklin [name], male   DOB: 1950/05/31, 65 y.o.   MRN: [id number]     Clinical Summary Mr. Abboud is a 65 y.o.male seen today for follow up of the following medical problems.   1. CAD - prior stenting to LCX and RCA as described below. LVEF 55% by LV gram  - compliant with meds, has been continued on indefinite plavix due to his heavy stent burden.   - denies any chest pain. No SOB or DOE - compliant with meds  2. Hyperlipidemia - myalgias on multiple statins previously, currently not on therapy.  - last lipid panel 09/2014: TC 148 TG 265 HDL 33 LDL 62  3. HTN - does not check regularly at home - compliant with meds. He reports   4. DM2 - followed by pcp  5. Tobacco  - former smoker 40+ years  Past Medical History  Diagnosis Date  . Other and unspecified hyperlipidemia   . Coronary atherosclerosis of native coronary artery     History of inferior myocardial infarction, treated with   CYPHER stenting of circumflex and right coronary artery, February 2004.Status post non-ST segment myocardial infarction treated with PROMUS drug-eluting stenting of distal right coronary artery and posterior descending (coronary) artery, August 2009.  EF 55-60%. Non-ischemic exercise Myoview; EF 50%, October 2009.    Marland Kitchen Charcot-Marie-Tooth disease   . Peripheral neuropathy (HCC)   . Statin intolerance     Severe myalgias  . Hearing loss   . Type 2 diabetes mellitus (HCC)      Allergies  Allergen Reactions  . Statins     REACTION: myalgia     Current Outpatient Prescriptions  Medication Sig Dispense Refill  . Ascorbic Acid (VITAMIN C) 1000 MG tablet Take 500 mg by mouth.    Marland Kitchen aspirin EC 81 MG tablet Take 81 mg by mouth daily.      . Calcium 500-125 MG-UNIT TABS Take 2 tablets by mouth.    . clopidogrel (PLAVIX) 75 MG tablet Take 1 tablet (75 mg total) by mouth daily. 90 tablet 0  . clopidogrel (PLAVIX) 75 MG tablet TAKE 1 TABLET (75 MG TOTAL) BY MOUTH DAILY. 30 tablet  1  . fish oil-omega-3 fatty acids 1000 MG capsule Take 2 g by mouth daily.      Marland Kitchen gabapentin (NEURONTIN) 100 MG capsule Take 1 capsule by mouth 3  times a day 270 capsule 0  . glucose blood (ONE TOUCH TEST STRIPS) test strip Use as instructed 100 each 12  . metFORMIN (GLUCOPHAGE) 500 MG tablet Take 1 tablet by mouth two  times daily with meals 180 tablet 0  . metoprolol (LOPRESSOR) 50 MG tablet Take 1 tablet (50 mg total) by mouth 2 (two) times daily. 60 tablet 0  . ONE TOUCH LANCETS MISC 1 each by Does not apply route 2 (two) times daily. 100 each 11  . vitamin C (ASCORBIC ACID) 500 MG tablet Take 500 mg by mouth daily.     No current facility-administered medications for this visit.     No past surgical history on file.   Allergies  Allergen Reactions  . Statins     REACTION: myalgia      Family history Denies any family history of heart disease   Social History Mr. Edelen reports that he quit smoking about 10 years ago. His smoking use included Cigarettes. He started smoking about 49 years ago. He has a 80 pack-year smoking history. He has never used smokeless tobacco. Mr.  Thurman reports that he does not drink alcohol.   Review of Systems CONSTITUTIONAL: No weight loss, fever, chills, weakness or fatigue.  HEENT: Eyes: No visual loss, blurred vision, double vision or yellow sclerae.No hearing loss, sneezing, congestion, runny nose or sore throat.  SKIN: No rash or itching.  CARDIOVASCULAR: per hpi RESPIRATORY: No shortness of breath, cough or sputum.  GASTROINTESTINAL: No anorexia, nausea, vomiting or diarrhea. No abdominal pain or blood.  GENITOURINARY: No burning on urination, no polyuria NEUROLOGICAL: No headache, dizziness, syncope, paralysis, ataxia, numbness or tingling in the extremities. No change in bowel or bladder control.  MUSCULOSKELETAL: No muscle, back pain, joint pain or stiffness.  LYMPHATICS: No enlarged nodes. No history of splenectomy.    PSYCHIATRIC: No history of depression or anxiety.  ENDOCRINOLOGIC: No reports of sweating, cold or heat intolerance. No polyuria or polydipsia.  Marland Kitchen   Physical Examination Filed Vitals:   04/28/15 0947  BP: 126/74  Pulse: 66   Filed Weights   04/28/15 0947  Weight: 186 lb (84.369 kg)    Gen: resting comfortably, no acute distress HEENT: no scleral icterus, pupils equal round and reactive, no palptable cervical adenopathy,  CV: RRR, no m/r/g, no jvd Resp: Clear to auscultation bilaterally GI: abdomen is soft, non-tender, non-distended, normal bowel sounds, no hepatosplenomegaly MSK: extremities are warm, no edema.  Skin: warm, no rash Neuro:  no focal deficits Psych: appropriate affect   Diagnostic Studies 11/2007 Cath Central aortic pressure 135/71 with a mean of 95. LV pressure was 132  with an EDP of 16. There was no aortic stenosis.  Left main had distal 20% stenosis.  LAD coursed through the apex gave off two diagonal branches. There was  a 60-70% proximal lesion in the LAD. This was smooth. There was mild  20-30% diffuse disease distally. In the small first diagonal there was  a 40-50% focal stenosis.  Left circumflex gave off an OM-1 and OM-2. There was a stent in the  proximal portion of the left circumflex. Prior to the stent, there was  a 40% stenosis in the mid portion of the left circ. At the takeoff of  the OM-1, there was 70% focal stenosis.  Right coronary artery was a large dominant vessel and had a long area of  diffuse stenting from the proximal portion and almost down to the distal  bend. There was mild diffuse disease within the stents, but the stents  were patent with good flow. Right after the stent, there was a 40-50%  focal stenosis. Distal to this, right before the PDA, there was a 90%  focal stenosis. In the mid to distal PDA, there was a 40% stenosis.  Left ventriculogram done in the RAO position showed an EF of 55-60% with  no  regional wall motion abnormalities.  ASSESSMENT:  1. Three-vessel coronary artery disease as described above.  2. The right coronary artery and left circumflex stents are patent.  3. There is a high-grade lesion in the distal right coronary artery.  4. Normal left ventricular function.  PLAN: For percutaneous intervention on the distal right coronary artery  by Dr. Clifton James. He will need aggressive risk factor management.  IMPRESSION: Successful percutaneous coronary intervention of the distal  right coronary artery and posterior descending artery with placement of  2 Promus drug-eluting stents.     Assessment and Plan    1. CAD - no current symptoms - continue current meds, including indefinite DAPT due to stent burden.   2. HTN -  at goal. He reports fatigue on ACE-I prevoiusly. Given his DM2 and CAD we will try low dose ARB, start losartan 12.5mg  daily. Check BMET in 2 weeks.   3. Hyperlipidemia - intolerant to statins. LDL not on meds is at goal. Conunseled on on diet and exercise to improve IGs and HDL.   4. Tobacco abuse - history of tobacco abuse. Will obtain AAA screening US.  F/u 1 year  Antoine PocheJonathan F. Alyssha Housh MD

## 2015-05-03 ENCOUNTER — Other Ambulatory Visit: Payer: Self-pay | Admitting: Family Medicine

## 2015-05-03 NOTE — Telephone Encounter (Signed)
Last seen 10/13/14  Dr Hyacinth MeekerMiller

## 2015-05-18 ENCOUNTER — Ambulatory Visit: Payer: Medicare Other

## 2015-05-18 DIAGNOSIS — Z136 Encounter for screening for cardiovascular disorders: Secondary | ICD-10-CM | POA: Diagnosis not present

## 2015-05-18 DIAGNOSIS — Z139 Encounter for screening, unspecified: Secondary | ICD-10-CM

## 2015-05-31 ENCOUNTER — Telehealth: Payer: Self-pay | Admitting: *Deleted

## 2015-05-31 NOTE — Telephone Encounter (Signed)
-----   Message from Antoine Poche, MD sent at 05/30/2015 10:35 AM EST ----- Normal abdominal aorta ultrasound  Dominga Ferry MD

## 2015-05-31 NOTE — Telephone Encounter (Signed)
Pt aware, routed to pcp 

## 2015-07-13 ENCOUNTER — Other Ambulatory Visit: Payer: Self-pay | Admitting: Family Medicine

## 2015-07-13 NOTE — Telephone Encounter (Signed)
Last seen 10/13/14  Dr Hyacinth MeekerMiller  Dr Christell ConstantMoore PCP

## 2015-10-11 ENCOUNTER — Telehealth: Payer: Self-pay | Admitting: Family Medicine

## 2015-10-12 MED ORDER — METFORMIN HCL 500 MG PO TABS: ORAL_TABLET | ORAL | Status: DC

## 2015-10-12 MED ORDER — CLOPIDOGREL BISULFATE 75 MG PO TABS: ORAL_TABLET | ORAL | Status: DC

## 2015-10-12 NOTE — Telephone Encounter (Signed)
done

## 2015-10-28 ENCOUNTER — Encounter: Payer: Self-pay | Admitting: Family Medicine

## 2015-10-28 ENCOUNTER — Ambulatory Visit (INDEPENDENT_AMBULATORY_CARE_PROVIDER_SITE_OTHER): Payer: Medicare Other | Admitting: Family Medicine

## 2015-10-28 VITALS — BP 122/78 | HR 78 | Temp 97.0°F | Ht 76.0 in | Wt 190.0 lb

## 2015-10-28 DIAGNOSIS — E785 Hyperlipidemia, unspecified: Secondary | ICD-10-CM | POA: Diagnosis not present

## 2015-10-28 DIAGNOSIS — E1121 Type 2 diabetes mellitus with diabetic nephropathy: Secondary | ICD-10-CM

## 2015-10-28 DIAGNOSIS — I10 Essential (primary) hypertension: Secondary | ICD-10-CM

## 2015-10-28 LAB — LIPID PANEL
Chol/HDL Ratio: 5 ratio units (ref 0.0–5.0)
Cholesterol, Total: 149 mg/dL (ref 100–199)
HDL: 30 mg/dL — ABNORMAL LOW (ref >39)
LDL Calculated: 69 mg/dL (ref 0–99)
Triglycerides: 249 mg/dL — ABNORMAL HIGH (ref 0–149)
VLDL Cholesterol Cal: 50 mg/dL — ABNORMAL HIGH (ref 5–40)

## 2015-10-28 LAB — CMP14+EGFR
ALBUMIN: 4.9 g/dL — AB (ref 3.6–4.8)
ALT: 28 IU/L (ref 0–44)
AST: 19 IU/L (ref 0–40)
Albumin/Globulin Ratio: 2.1 (ref 1.2–2.2)
Alkaline Phosphatase: 53 IU/L (ref 39–117)
BUN / CREAT RATIO: 20 (ref 10–24)
BUN: 25 mg/dL (ref 8–27)
Bilirubin Total: 0.5 mg/dL (ref 0.0–1.2)
CALCIUM: 9.8 mg/dL (ref 8.6–10.2)
CO2: 19 mmol/L (ref 18–29)
CREATININE: 1.25 mg/dL (ref 0.76–1.27)
Chloride: 98 mmol/L (ref 96–106)
GFR, EST AFRICAN AMERICAN: 69 mL/min/{1.73_m2} (ref >59)
GFR, EST NON AFRICAN AMERICAN: 60 mL/min/{1.73_m2} (ref >59)
GLUCOSE: 111 mg/dL — AB (ref 65–99)
Globulin, Total: 2.3 g/dL (ref 1.5–4.5)
Potassium: 4.7 mmol/L (ref 3.5–5.2)
Sodium: 138 mmol/L (ref 134–144)
TOTAL PROTEIN: 7.2 g/dL (ref 6.0–8.5)

## 2015-10-28 LAB — BAYER DCA HB A1C WAIVED: HB A1C: 6.1 % (ref <7.0)

## 2015-10-28 MED ORDER — METFORMIN HCL 500 MG PO TABS: ORAL_TABLET | ORAL | Status: DC

## 2015-10-28 MED ORDER — CLOPIDOGREL BISULFATE 75 MG PO TABS: ORAL_TABLET | ORAL | Status: DC

## 2015-10-28 MED ORDER — GABAPENTIN 100 MG PO CAPS: ORAL_CAPSULE | ORAL | Status: DC

## 2015-10-28 NOTE — Patient Instructions (Signed)
Medicare Annual Wellness Visit  [place] and the medical providers at [facility] strive to bring you the best medical care.  In doing so we not only want to address your current medical conditions and concerns but also to detect new conditions early and prevent illness, disease and health-related problems.    Medicare offers a yearly Wellness Visit which allows our clinical staff to assess your need for preventative services including immunizations, lifestyle education, counseling to decrease risk of preventable diseases and screening for fall risk and other medical concerns.    This visit is provided free of charge (no copay) for all Medicare recipients. The clinical pharmacists at [facility] have begun to conduct these Wellness Visits which will also include a thorough review of all your medications.    As you primary medical provider recommend that you make an appointment for your Annual Wellness Visit if you have not done so already this year.  You may set up this appointment before you leave today or you may call back ([phone]) and schedule an appointment.  Please make sure when you call that you mention that you are scheduling your Annual Wellness Visit with the clinical pharmacist so that the appointment may be made for the proper length of time.     Continue current medications. Continue good therapeutic lifestyle changes which include good diet and exercise. Fall precautions discussed with patient. If an FOBT was given today- please return it to our front desk. If you are over 50 years old - you may need Prevnar 13 or the adult Pneumonia vaccine.  **Flu shots are available--- please call and schedule a FLU-CLINIC appointment**  After your visit with us today you will receive a survey in the mail or online from [employer] regarding your care with us. Please take a moment to fill this out. Your feedback is very  important to us as you can help us better understand your patient needs as well as improve your experience and satisfaction. WE CARE ABOUT YOU!!!    

## 2015-10-28 NOTE — Progress Notes (Signed)
Subjective:    Patient ID: Franklin [name], male    DOB: June 29, 1950, 65 y.o.   MRN: [id number]  HPI Pt here for follow up and management of chronic medical problems which includes diabetes, hypertension and hyperlipidemia. He is taking medications regularly. No complaints or problems today. He checks sugars at home occasionally and also been good and indeed his last A1c one year ago was 5.7. He takes only metformin. He does have some neuropathy. This does not seem to be too big a concern as symptoms are controlled with gabapentin. He is followed by cardiology for coronary disease. He has a stent and takes Plavix.    Patient Active Problem List   Diagnosis Date Noted  . Type 2 diabetes mellitus ([place])   . Statin intolerance   . Peripheral neuropathy ([place])   . Hyperlipidemia 01/05/2009  . CAD, NATIVE VESSEL 01/05/2009   Outpatient Encounter Prescriptions as of 10/28/2015  Medication Sig  . Ascorbic Acid (VITAMIN C) 1000 MG tablet Take 500 mg by mouth.  Marland Kitchen aspirin EC 81 MG tablet Take 81 mg by mouth daily.    . Calcium 500-125 MG-UNIT TABS Take 2 tablets by mouth.  . clopidogrel (PLAVIX) 75 MG tablet Take 1 tablet by mouth  daily  . fish oil-omega-3 fatty acids 1000 MG capsule Take 2 g by mouth daily.    Marland Kitchen gabapentin (NEURONTIN) 100 MG capsule Take 1 capsule by mouth 3  times a day  . glucose blood (ONE TOUCH TEST STRIPS) test strip Use as instructed  . losartan (COZAAR) 25 MG tablet Take 0.5 tablets (12.5 mg total) by mouth daily.  . metFORMIN (GLUCOPHAGE) 500 MG tablet Take 1 tablet by mouth two  times daily with meals  . metoprolol (LOPRESSOR) 50 MG tablet Take 1 tablet (50 mg total) by mouth 2 (two) times daily.  . ONE TOUCH LANCETS MISC 1 each by Does not apply route 2 (two) times daily.  . vitamin C (ASCORBIC ACID) 500 MG tablet Take 500 mg by mouth daily.  . [DISCONTINUED] clopidogrel (PLAVIX) 75 MG tablet Take 1 tablet by mouth  daily  . [DISCONTINUED] gabapentin (NEURONTIN) 100  MG capsule Take 1 capsule by mouth 3  times a day  . [DISCONTINUED] metFORMIN (GLUCOPHAGE) 500 MG tablet Take 1 tablet by mouth two  times daily with meals   No facility-administered encounter medications on file as of 10/28/2015.      Review of Systems  Constitutional: Negative.   HENT: Negative.   Eyes: Negative.   Respiratory: Negative.   Cardiovascular: Negative.   Gastrointestinal: Negative.   Endocrine: Negative.   Genitourinary: Negative.   Musculoskeletal: Negative.   Skin: Negative.   Allergic/Immunologic: Negative.   Neurological: Negative.   Hematological: Negative.   Psychiatric/Behavioral: Negative.        Objective:   Physical Exam  Constitutional: He is oriented to person, place, and time. He appears well-developed and well-nourished.  Cardiovascular: Regular rhythm, normal heart sounds and intact distal pulses.   Pulmonary/Chest: Effort normal and breath sounds normal.  Neurological: He is alert and oriented to person, place, and time. He has normal reflexes.  Psychiatric: He has a normal mood and affect. His behavior is normal.    BP 122/78 mmHg  Pulse 78  Temp(Src) 97 F (36.1 C) (Oral)  Ht '6\' 4"'$  (1.93 m)  Wt 190 lb (86.183 kg)  BMI 23.14 kg/m2       Assessment & Plan:  1. Hyperlipidemia DL cholesterol is at  goal on no medications. He is said to be statin intolerant - Lipid panel  2. Essential hypertension Blood pressure is 122/78 and well controlled on losartan and metoprolol - CMP14+EGFR  3. Type 2 diabetes mellitus with diabetic nephropathy, without long-term current use of insulin (HCC) Diabetes has been well controlled on gabapentin will likely continue same - [facility] DCA Hb A1c Waived  Wardell Honour MD

## 2016-04-13 DIAGNOSIS — H43811 Vitreous degeneration, right eye: Secondary | ICD-10-CM | POA: Diagnosis not present

## 2016-04-13 DIAGNOSIS — H527 Unspecified disorder of refraction: Secondary | ICD-10-CM | POA: Diagnosis not present

## 2016-04-13 DIAGNOSIS — H25813 Combined forms of age-related cataract, bilateral: Secondary | ICD-10-CM | POA: Diagnosis not present

## 2016-04-13 DIAGNOSIS — E119 Type 2 diabetes mellitus without complications: Secondary | ICD-10-CM | POA: Diagnosis not present

## 2016-06-21 ENCOUNTER — Encounter: Payer: Self-pay | Admitting: Cardiology

## 2016-06-21 ENCOUNTER — Ambulatory Visit (INDEPENDENT_AMBULATORY_CARE_PROVIDER_SITE_OTHER): Payer: Medicare Other | Admitting: Cardiology

## 2016-06-21 VITALS — BP 128/72 | HR 80 | Ht 76.0 in | Wt 192.6 lb

## 2016-06-21 DIAGNOSIS — I2510 Atherosclerotic heart disease of native coronary artery without angina pectoris: Secondary | ICD-10-CM

## 2016-06-21 DIAGNOSIS — I10 Essential (primary) hypertension: Secondary | ICD-10-CM | POA: Diagnosis not present

## 2016-06-21 DIAGNOSIS — E782 Mixed hyperlipidemia: Secondary | ICD-10-CM | POA: Diagnosis not present

## 2016-06-21 NOTE — Progress Notes (Addendum)
Clinical Summary Franklin [name] is a 66 y.o.male seen today for follow up of the following medical problems.   1. CAD - prior stenting to LCX and RCA in 2009 as described below. LVEF 55% by LV gram  - has been continued on indefinite plavix due to his heavy stent burden.   - no chest pain or SOB - compliant with meds.   2. Hyperlipidemia - myalgias on multiple statins previously, currently not on therapy.  - last lipid panel 10/2015 TC 149 TG 250 HDL 30 LDL 69  3. HTN - last visit started low dose losartan. He reported fatigue on ACE-Is in the past, tolerating ARB  4. DM2 - followed by pcp   SH: enjoys duck hunting and fishing. 2 sons 47 and 30   Past Medical History:  Diagnosis Date  . Charcot-Marie-Tooth disease   . Coronary atherosclerosis of native coronary artery    History of inferior myocardial infarction, treated with   CYPHER stenting of circumflex and right coronary artery, February 2004.Status post non-ST segment myocardial infarction treated with PROMUS drug-eluting stenting of distal right coronary artery and posterior descending (coronary) artery, August 2009.  EF 55-60%. Non-ischemic exercise Myoview; EF 50%, October 2009.    Marland Kitchen Hearing loss   . Other and unspecified hyperlipidemia   . Peripheral neuropathy (HCC)   . Statin intolerance    Severe myalgias  . Type 2 diabetes mellitus (HCC)      Allergies  Allergen Reactions  . Statins     REACTION: myalgia     Current Outpatient Prescriptions  Medication Sig Dispense Refill  . Ascorbic Acid (VITAMIN C) 1000 MG tablet Take 500 mg by mouth.    Marland Kitchen aspirin EC 81 MG tablet Take 81 mg by mouth daily.      . Calcium 500-125 MG-UNIT TABS Take 2 tablets by mouth.    . clopidogrel (PLAVIX) 75 MG tablet Take 1 tablet by mouth  daily 90 tablet 3  . fish oil-omega-3 fatty acids 1000 MG capsule Take 2 g by mouth daily.      Marland Kitchen gabapentin (NEURONTIN) 100 MG capsule Take 1 capsule by mouth 3  times a day 270  capsule 3  . glucose blood (ONE TOUCH TEST STRIPS) test strip Use as instructed 100 each 12  . losartan (COZAAR) 25 MG tablet Take 0.5 tablets (12.5 mg total) by mouth daily. 15 tablet 6  . metFORMIN (GLUCOPHAGE) 500 MG tablet Take 1 tablet by mouth two  times daily with meals 180 tablet 3  . metoprolol (LOPRESSOR) 50 MG tablet Take 1 tablet (50 mg total) by mouth 2 (two) times daily. 60 tablet 0  . ONE TOUCH LANCETS MISC 1 each by Does not apply route 2 (two) times daily. 100 each 11  . vitamin C (ASCORBIC ACID) 500 MG tablet Take 500 mg by mouth daily.     No current facility-administered medications for this visit.      No past surgical history on file.   Allergies  Allergen Reactions  . Statins     REACTION: myalgia      No family history on file.   Social History Franklin [name] reports that he quit smoking about 11 years ago. His smoking use included Cigarettes. He started smoking about 50 years ago. He has a 80.00 pack-year smoking history. He has never used smokeless tobacco. Franklin [name] reports that he does not drink alcohol.   Review of Systems CONSTITUTIONAL: No weight loss, fever, chills,  weakness or fatigue.  HEENT: Eyes: No visual loss, blurred vision, double vision or yellow sclerae.No hearing loss, sneezing, congestion, runny nose or sore throat.  SKIN: No rash or itching.  CARDIOVASCULAR: per hpi RESPIRATORY: No shortness of breath, cough or sputum.  GASTROINTESTINAL: No anorexia, nausea, vomiting or diarrhea. No abdominal pain or blood.  GENITOURINARY: No burning on urination, no polyuria NEUROLOGICAL: No headache, dizziness, syncope, paralysis, ataxia, numbness or tingling in the extremities. No change in bowel or bladder control.  MUSCULOSKELETAL: No muscle, back pain, joint pain or stiffness.  LYMPHATICS: No enlarged nodes. No history of splenectomy.  PSYCHIATRIC: No history of depression or anxiety.  ENDOCRINOLOGIC: No reports of sweating, cold or heat  intolerance. No polyuria or polydipsia.  Marland Kitchen.   Physical Examination Vitals:   06/21/16 1009  BP: 128/72  Pulse: 80   Vitals:   06/21/16 1009  Weight: 192 lb 9.6 oz (87.4 kg)  Height: 6\' 4"  (1.93 m)    Gen: resting comfortably, no acute distress HEENT: no scleral icterus, pupils equal round and reactive, no palptable cervical adenopathy,  CV: RRR, no m/r/g no jvd Resp: Clear to auscultation bilaterally GI: abdomen is soft, non-tender, non-distended, normal bowel sounds, no hepatosplenomegaly MSK: extremities are warm, no edema.  Skin: warm, no rash Neuro:  no focal deficits Psych: appropriate affect   Diagnostic Studies 11/2007 Cath Central aortic pressure 135/71 with a mean of 95. LV pressure was 132  with an EDP of 16. There was no aortic stenosis.  Left main had distal 20% stenosis.  LAD coursed through the apex gave off two diagonal branches. There was  a 60-70% proximal lesion in the LAD. This was smooth. There was mild  20-30% diffuse disease distally. In the small first diagonal there was  a 40-50% focal stenosis.  Left circumflex gave off an OM-1 and OM-2. There was a stent in the  proximal portion of the left circumflex. Prior to the stent, there was  a 40% stenosis in the mid portion of the left circ. At the takeoff of  the OM-1, there was 70% focal stenosis.  Right coronary artery was a large dominant vessel and had a long area of  diffuse stenting from the proximal portion and almost down to the distal  bend. There was mild diffuse disease within the stents, but the stents  were patent with good flow. Right after the stent, there was a 40-50%  focal stenosis. Distal to this, right before the PDA, there was a 90%  focal stenosis. In the mid to distal PDA, there was a 40% stenosis.  Left ventriculogram done in the RAO position showed an EF of 55-60% with  no regional wall motion abnormalities.  ASSESSMENT:  1. Three-vessel coronary artery  disease as described above.  2. The right coronary artery and left circumflex stents are patent.  3. There is a high-grade lesion in the distal right coronary artery.  4. Normal left ventricular function.  PLAN: For percutaneous intervention on the distal right coronary artery  by Dr. Clifton JamesMcAlhany. He will need aggressive risk factor management.  IMPRESSION: Successful percutaneous coronary intervention of the distal  right coronary artery and posterior descending artery with placement of  2 Promus drug-eluting stents.   Jan 2017 AAA Screen US No aneurysm  Assessment and Plan  1. CAD - no current symptoms. EKG in clinic today shows SR, no ischemic changes - indefinite DAPT due to stent burden.  - continue currnet meds  2. HTN - at  goal. Continue current meds  3. Hyperlipidemia - intolerant to statins. LDL not on meds is at goal. No indication for zetia or pcsk9 inhibitor.     F/u 1 year    Antoine Poche, M.D.

## 2016-06-21 NOTE — Patient Instructions (Signed)

## 2016-09-29 ENCOUNTER — Other Ambulatory Visit: Payer: Self-pay | Admitting: Family Medicine

## 2016-10-02 NOTE — Telephone Encounter (Signed)
Authorize 30 days only. Then contact the patient letting them know that they will need an appointment before any further prescriptions can be sent in. 

## 2016-12-06 ENCOUNTER — Other Ambulatory Visit: Payer: Self-pay | Admitting: Family Medicine

## 2016-12-06 NOTE — Telephone Encounter (Signed)
Patient needs an appointment, we can give him enough to get through that appointment but he has not been seen in over a year and needs an appointment ASAP.

## 2016-12-06 NOTE — Telephone Encounter (Signed)
Last seen 11/07/15  Dr Dettinger

## 2016-12-07 NOTE — Telephone Encounter (Signed)
Wife aware per dpr and states patient has enough to last 3 more months and will make apt to come in.

## 2016-12-07 NOTE — Telephone Encounter (Signed)
lmtcb

## 2017-01-16 ENCOUNTER — Ambulatory Visit (INDEPENDENT_AMBULATORY_CARE_PROVIDER_SITE_OTHER): Payer: Medicare Other | Admitting: Family Medicine

## 2017-01-16 ENCOUNTER — Encounter: Payer: Self-pay | Admitting: Family Medicine

## 2017-01-16 VITALS — BP 124/76 | HR 76 | Temp 97.9°F | Ht 76.0 in | Wt 191.0 lb

## 2017-01-16 DIAGNOSIS — E1121 Type 2 diabetes mellitus with diabetic nephropathy: Secondary | ICD-10-CM

## 2017-01-16 DIAGNOSIS — E782 Mixed hyperlipidemia: Secondary | ICD-10-CM | POA: Diagnosis not present

## 2017-01-16 DIAGNOSIS — Z0000 Encounter for general adult medical examination without abnormal findings: Secondary | ICD-10-CM | POA: Diagnosis not present

## 2017-01-16 DIAGNOSIS — H9193 Unspecified hearing loss, bilateral: Secondary | ICD-10-CM

## 2017-01-16 DIAGNOSIS — R351 Nocturia: Secondary | ICD-10-CM

## 2017-01-16 DIAGNOSIS — Z125 Encounter for screening for malignant neoplasm of prostate: Secondary | ICD-10-CM | POA: Diagnosis not present

## 2017-01-16 DIAGNOSIS — N401 Enlarged prostate with lower urinary tract symptoms: Secondary | ICD-10-CM | POA: Insufficient documentation

## 2017-01-16 DIAGNOSIS — G609 Hereditary and idiopathic neuropathy, unspecified: Secondary | ICD-10-CM

## 2017-01-16 DIAGNOSIS — B078 Other viral warts: Secondary | ICD-10-CM | POA: Diagnosis not present

## 2017-01-16 DIAGNOSIS — G600 Hereditary motor and sensory neuropathy: Secondary | ICD-10-CM

## 2017-01-16 LAB — URINALYSIS
Bilirubin, UA: NEGATIVE
Glucose, UA: NEGATIVE
KETONES UA: NEGATIVE
LEUKOCYTES UA: NEGATIVE
NITRITE UA: NEGATIVE
PH UA: 5 (ref 5.0–7.5)
RBC, UA: NEGATIVE
Specific Gravity, UA: 1.02 (ref 1.005–1.030)
UUROB: 0.2 mg/dL (ref 0.2–1.0)

## 2017-01-16 LAB — BAYER DCA HB A1C WAIVED: HB A1C (BAYER DCA - WAIVED): 6.2 % (ref <7.0)

## 2017-01-16 MED ORDER — LOSARTAN POTASSIUM 25 MG PO TABS: 12.5000 mg | ORAL_TABLET | Freq: Every day | ORAL | 6 refills | Status: DC

## 2017-01-16 MED ORDER — GLUCOSE BLOOD VI STRP: ORAL_STRIP | 12 refills | Status: DC

## 2017-01-16 MED ORDER — METFORMIN HCL 500 MG PO TABS: ORAL_TABLET | ORAL | 0 refills | Status: DC

## 2017-01-16 MED ORDER — ONETOUCH LANCETS MISC: 1.0000 | Freq: Two times a day (BID) | 11 refills | Status: DC

## 2017-01-16 MED ORDER — GABAPENTIN 100 MG PO CAPS: ORAL_CAPSULE | ORAL | 3 refills | Status: DC

## 2017-01-16 MED ORDER — CLOPIDOGREL BISULFATE 75 MG PO TABS: 75.0000 mg | ORAL_TABLET | Freq: Every day | ORAL | 0 refills | Status: DC

## 2017-01-16 NOTE — Progress Notes (Signed)
Subjective:  Patient ID: Franklin [name], male    DOB: 02/01/1951  Age: 66 y.o. MRN: [id number]  CC: Annual Exam (pt here today for CPE, no concerns voiced)   HPI Franklin [name] presents for Complete physical. No complaints today. 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 Hearing aids don't seem to help. His wife just talked to them very loudly and he is able to make out enough to communicate. He does not use sign language. This patient has been seen in the past by Dr. Sabra [name]. This is his first visit with me. He is a diabetic. He checks his blood sugar occasionally. It usually around 120.  Depression screen [facility] 2/9 01/16/2017 10/28/2015 10/20/2013  Decreased Interest 0 0 0  Down, Depressed, Hopeless 0 0 0  PHQ - 2 Score 0 0 0    History Franklin [name] has a past medical history of Charcot-Marie-Tooth disease; Coronary atherosclerosis of native coronary artery; Hearing loss; Other and unspecified hyperlipidemia; Peripheral neuropathy; Statin intolerance; and Type 2 diabetes mellitus ([place]).   He has no past surgical history on file.   His family history is not on file.He reports that he quit smoking about 11 years ago. His smoking use included Cigarettes. He started smoking about 51 years ago. He has a 80.00 pack-year smoking history. He has never used smokeless tobacco. He reports that he does not drink alcohol or use drugs.    ROS Review of Systems  Constitutional: Negative for activity change, appetite change, chills, diaphoresis, fatigue, fever and unexpected weight change.  HENT: Positive for hearing loss (see history of present illness). Negative for congestion, ear pain, postnasal drip, rhinorrhea, sore throat, tinnitus and trouble swallowing.   Eyes: Negative for photophobia, pain, discharge and redness.  Respiratory: Negative for apnea, cough, choking, chest tightness, shortness of breath, wheezing and stridor.   Cardiovascular: Negative for chest  pain, palpitations and leg swelling.  Gastrointestinal: Negative for abdominal distention, abdominal pain, blood in stool, constipation, diarrhea, nausea and vomiting.  Endocrine: Negative for cold intolerance, heat intolerance, polydipsia, polyphagia and polyuria.  Genitourinary: Positive for difficulty urinating (he is seen a urologist. Does not want any further treatment at this time although surgery was recommended for BPH). Negative for dysuria, enuresis, flank pain, frequency, genital sores, hematuria and urgency.  Musculoskeletal: Negative for arthralgias and joint swelling.  Skin: Negative for color change, rash and wound.       Recurrent warts on the knuckles of the middle 3 fingers at the MCP joint left hand. Another on the left hand 3 cm proximal to the fifth MCP. A fifth lesion noted at the base of the thumb of the right hand dorsally.   Allergic/Immunologic: Negative for immunocompromised state.  Neurological: Negative for dizziness, tremors, seizures, syncope, facial asymmetry, speech difficulty, weakness, light-headedness, numbness and headaches.  Hematological: Does not bruise/bleed easily.  Psychiatric/Behavioral: Negative for agitation, behavioral problems, confusion, decreased concentration, dysphoric mood, hallucinations, sleep disturbance and suicidal ideas. The patient is not nervous/anxious and is not hyperactive.     Objective:  BP 124/76   Pulse 76   Temp 97.9 F (36.6 C) (Oral)   Ht '6\' 4"'$  (1.93 m)   Wt 191 lb (86.6 kg)   BMI 23.25 kg/m   BP Readings from Last 3 Encounters:  01/16/17 124/76  06/21/16 128/72  10/28/15 122/78    Wt Readings from Last 3 Encounters:  01/16/17 191 lb (86.6 kg)  06/21/16 192 lb 9.6 oz (87.4  kg)  10/28/15 190 lb (86.2 kg)     Physical Exam  Constitutional: He is oriented to person, place, and time. He appears well-developed and well-nourished.  HENT:  Head: Normocephalic and atraumatic.  Right Ear: Decreased hearing is noted.   Left Ear: Decreased hearing is noted.  Mouth/Throat: Oropharynx is clear and moist.  Eyes: Pupils are equal, round, and reactive to light. EOM are normal.  Neck: Normal range of motion. No tracheal deviation present. No thyromegaly present.  Cardiovascular: Normal rate, regular rhythm and normal heart sounds.  Exam reveals no gallop and no friction rub.   No murmur heard. Pulmonary/Chest: Breath sounds normal. He has no wheezes. He has no rales.  Abdominal: Soft. He exhibits no mass. There is no tenderness.  Musculoskeletal: Normal range of motion. He exhibits no edema.  Neurological: He is alert and oriented to person, place, and time.  Skin: Skin is warm and dry. Lesion ( warts on the knuckles of the middle 3 fingers at the MCP joint left hand. Another on the left hand 3 cm proximal to the fifth MCP. A fifth lesion noted at the base of the thumb of the right hand dorsally. Each of these was frozen with liquid nitrogen cryo) noted.  Psychiatric: He has a normal mood and affect.      Assessment & Plan:   Franklin [name] was seen today for annual exam.  Diagnoses and all orders for this visit:  Well adult exam -     VITAMIN D 25 Hydroxy (Vit-D Deficiency, Fractures)  Mixed hyperlipidemia -     CMP14+EGFR -     Lipid panel  Idiopathic peripheral neuropathy  Type 2 diabetes mellitus with diabetic nephropathy, without long-term current use of insulin (HCC) -     CBC with Differential/Platelet -     Bayer DCA Hb A1c Waived -     Urinalysis -     Microalbumin / creatinine urine ratio  Screening for prostate cancer -     PSA, total and free  Charcot-Marie-Tooth disease  Bilateral deafness  BPH associated with nocturia  Common wart  Other orders -     clopidogrel (PLAVIX) 75 MG tablet; Take 1 tablet (75 mg total) by mouth daily. -     gabapentin (NEURONTIN) 100 MG capsule; Take 1 capsule by mouth 3  times a day -     glucose blood (ONE TOUCH TEST STRIPS) test strip; Use as  instructed -     losartan (COZAAR) 25 MG tablet; Take 0.5 tablets (12.5 mg total) by mouth daily. -     metFORMIN (GLUCOPHAGE) 500 MG tablet; TAKE 1 TABLET BY MOUTH TWO  TIMES DAILY WITH MEALS -     ONE TOUCH LANCETS MISC; 1 each by Does not apply route 2 (two) times daily.       I have changed Franklin [name] clopidogrel. I am also having him maintain his aspirin EC, fish oil-omega-3 fatty acids, vitamin C, Calcium, vitamin C, metoprolol tartrate, gabapentin, glucose blood, losartan, metFORMIN, and ONE TOUCH LANCETS.  Allergies as of 01/16/2017      Reactions   Statins    REACTION: myalgia      Medication List       Accurate as of 01/16/17 12:03 PM. Always use your most recent med list.          aspirin EC 81 MG tablet Take 81 mg by mouth daily.   Calcium 500-125 MG-UNIT Tabs Take 2 tablets by mouth.   clopidogrel 75  MG tablet Commonly known as:  PLAVIX Take 1 tablet (75 mg total) by mouth daily.   fish oil-omega-3 fatty acids 1000 MG capsule Take 2 g by mouth daily.   gabapentin 100 MG capsule Commonly known as:  NEURONTIN Take 1 capsule by mouth 3  times a day   glucose blood test strip Commonly known as:  ONE TOUCH TEST STRIPS Use as instructed   losartan 25 MG tablet Commonly known as:  COZAAR Take 0.5 tablets (12.5 mg total) by mouth daily.   metFORMIN 500 MG tablet Commonly known as:  GLUCOPHAGE TAKE 1 TABLET BY MOUTH TWO  TIMES DAILY WITH MEALS   metoprolol tartrate 50 MG tablet Commonly known as:  LOPRESSOR Take 1 tablet (50 mg total) by mouth 2 (two) times daily.   ONE TOUCH LANCETS Misc 1 each by Does not apply route 2 (two) times daily.   vitamin C 500 MG tablet Commonly known as:  ASCORBIC ACID Take 500 mg by mouth daily.   vitamin C 1000 MG tablet Take 500 mg by mouth.            Discharge Care Instructions        Start     Ordered   01/16/17 0000  CBC with Differential/Platelet     01/16/17 1129   01/16/17 0000  CMP14+EGFR      01/16/17 1129   01/16/17 0000  Lipid panel     01/16/17 1129   01/16/17 0000  Bayer DCA Hb A1c Waived     01/16/17 1129   01/16/17 0000  Urinalysis     01/16/17 1129   01/16/17 0000  VITAMIN D 25 Hydroxy (Vit-D Deficiency, Fractures)     01/16/17 1129   01/16/17 0000  PSA, total and free     01/16/17 1129   01/16/17 0000  Microalbumin / creatinine urine ratio     01/16/17 1129   01/16/17 0000  clopidogrel (PLAVIX) 75 MG tablet  Daily     01/16/17 1132   01/16/17 0000  gabapentin (NEURONTIN) 100 MG capsule     01/16/17 1132   01/16/17 0000  glucose blood (ONE TOUCH TEST STRIPS) test strip     01/16/17 1132   01/16/17 0000  losartan (COZAAR) 25 MG tablet  Daily    Comments:  New 04/28/2015.   01/16/17 1132   01/16/17 0000  metFORMIN (GLUCOPHAGE) 500 MG tablet     01/16/17 1132   01/16/17 0000  ONE TOUCH LANCETS MISC  2 times daily     01/16/17 1132       Follow-up: No Follow-up on file.  Claretta Fraise, M.D.

## 2017-01-17 LAB — LIPID PANEL
CHOLESTEROL TOTAL: 161 mg/dL (ref 100–199)
Chol/HDL Ratio: 4.7 ratio (ref 0.0–5.0)
HDL: 34 mg/dL — AB (ref >39)
LDL Calculated: 88 mg/dL (ref 0–99)
TRIGLYCERIDES: 195 mg/dL — AB (ref 0–149)
VLDL CHOLESTEROL CAL: 39 mg/dL (ref 5–40)

## 2017-01-17 LAB — CMP14+EGFR
ALBUMIN: 5 g/dL — AB (ref 3.6–4.8)
ALK PHOS: 51 IU/L (ref 39–117)
ALT: 24 IU/L (ref 0–44)
AST: 22 IU/L (ref 0–40)
Albumin/Globulin Ratio: 2.1 (ref 1.2–2.2)
BUN / CREAT RATIO: 18 (ref 10–24)
BUN: 21 mg/dL (ref 8–27)
Bilirubin Total: 0.4 mg/dL (ref 0.0–1.2)
CO2: 23 mmol/L (ref 20–29)
CREATININE: 1.2 mg/dL (ref 0.76–1.27)
Calcium: 10.1 mg/dL (ref 8.6–10.2)
Chloride: 101 mmol/L (ref 96–106)
GFR calc Af Amer: 72 mL/min/{1.73_m2} (ref >59)
GFR calc non Af Amer: 62 mL/min/{1.73_m2} (ref >59)
GLUCOSE: 105 mg/dL — AB (ref 65–99)
Globulin, Total: 2.4 g/dL (ref 1.5–4.5)
Potassium: 4.5 mmol/L (ref 3.5–5.2)
Sodium: 139 mmol/L (ref 134–144)
Total Protein: 7.4 g/dL (ref 6.0–8.5)

## 2017-01-17 LAB — CBC WITH DIFFERENTIAL/PLATELET
BASOS ABS: 0 10*3/uL (ref 0.0–0.2)
BASOS: 0 %
EOS (ABSOLUTE): 0.1 10*3/uL (ref 0.0–0.4)
Eos: 1 %
HEMOGLOBIN: 15.2 g/dL (ref 13.0–17.7)
Hematocrit: 45 % (ref 37.5–51.0)
IMMATURE GRANS (ABS): 0 10*3/uL (ref 0.0–0.1)
IMMATURE GRANULOCYTES: 0 %
LYMPHS: 31 %
Lymphocytes Absolute: 2.2 10*3/uL (ref 0.7–3.1)
MCH: 30.5 pg (ref 26.6–33.0)
MCHC: 33.8 g/dL (ref 31.5–35.7)
MCV: 90 fL (ref 79–97)
MONOCYTES: 11 %
Monocytes Absolute: 0.8 10*3/uL (ref 0.1–0.9)
NEUTROS ABS: 4.1 10*3/uL (ref 1.4–7.0)
NEUTROS PCT: 57 %
PLATELETS: 253 10*3/uL (ref 150–379)
RBC: 4.98 x10E6/uL (ref 4.14–5.80)
RDW: 14.5 % (ref 12.3–15.4)
WBC: 7.3 10*3/uL (ref 3.4–10.8)

## 2017-01-17 LAB — MICROALBUMIN / CREATININE URINE RATIO
Creatinine, Urine: 105.4 mg/dL
MICROALB/CREAT RATIO: 88.5 mg/g{creat} — AB (ref 0.0–30.0)
Microalbumin, Urine: 93.3 ug/mL

## 2017-01-17 LAB — PSA, TOTAL AND FREE
PROSTATE SPECIFIC AG, SERUM: 1.5 ng/mL (ref 0.0–4.0)
PSA FREE PCT: 41.3 %
PSA FREE: 0.62 ng/mL

## 2017-01-17 LAB — VITAMIN D 25 HYDROXY (VIT D DEFICIENCY, FRACTURES): Vit D, 25-Hydroxy: 32 ng/mL (ref 30.0–100.0)

## 2017-03-06 ENCOUNTER — Other Ambulatory Visit: Payer: Self-pay | Admitting: Family Medicine

## 2017-06-03 ENCOUNTER — Encounter: Payer: Self-pay | Admitting: Nurse Practitioner

## 2017-06-03 ENCOUNTER — Ambulatory Visit: Payer: Medicare Other | Admitting: Nurse Practitioner

## 2017-06-03 VITALS — BP 120/70 | HR 85 | Temp 98.3°F | Ht 76.0 in | Wt 181.0 lb

## 2017-06-03 DIAGNOSIS — H66002 Acute suppurative otitis media without spontaneous rupture of ear drum, left ear: Secondary | ICD-10-CM

## 2017-06-03 MED ORDER — AMOXICILLIN-POT CLAVULANATE 875-125 MG PO TABS: 1.0000 | ORAL_TABLET | Freq: Two times a day (BID) | ORAL | 0 refills | Status: DC

## 2017-06-03 NOTE — Patient Instructions (Signed)
Otitis Media, Adult Otitis media is redness, soreness, and puffiness (swelling) in the space just behind your eardrum (middle ear). It may be caused by allergies or infection. It often happens along with a cold. Follow these instructions at home:  Take your medicine as told. Finish it even if you start to feel better.  Only take over-the-counter or prescription medicines for pain, discomfort, or fever as told by your doctor.  Follow up with your doctor as told. Contact a doctor if:  You have otitis media only in one ear, or bleeding from your nose, or both.  You notice a lump on your neck.  You are not getting better in 3-5 days.  You feel worse instead of better. Get help right away if:  You have pain that is not helped with medicine.  You have puffiness, redness, or pain around your ear.  You get a stiff neck.  You cannot move part of your face (paralysis).  You notice that the bone behind your ear hurts when you touch it. This information is not intended to replace advice given to you by your health care provider. Make sure you discuss any questions you have with your health care provider. Document Released: 09/26/2007 Document Revised: 09/15/2015 Document Reviewed: 11/04/2012 Elsevier Interactive Patient Education  2017 Elsevier Inc.  

## 2017-06-03 NOTE — Progress Notes (Signed)
   Subjective:    Patient ID: Franklin [name], male    DOB: 03-07-1951, 67 y.o.   MRN: [id number]  HPI Patient comes in today c/o ear pain. Patient was taking a shower yesterday and got water in his ear and cannot get it out.denies pain. He is legally deaf and this has made his hearing worse.  Review of Systems  Constitutional: Negative.   HENT: Negative for ear discharge and ear pain.   Respiratory: Negative.   Cardiovascular: Negative.   Gastrointestinal: Negative.   Genitourinary: Negative.   Neurological: Negative.   Psychiatric/Behavioral: Negative.   All other systems reviewed and are negative.      Objective:   Physical Exam  Constitutional: He is oriented to person, place, and time. He appears well-developed. No distress.  HENT:  Right Ear: Hearing, tympanic membrane, external ear and ear canal normal.  Left Ear: Tympanic membrane is erythematous. A middle ear effusion is present.  Nose: Nose normal.  Mouth/Throat: Uvula is midline.  Cardiovascular: Normal rate and regular rhythm.  Pulmonary/Chest: Effort normal and breath sounds normal.  Neurological: He is alert and oriented to person, place, and time.  Skin: Skin is warm.  Psychiatric: He has a normal mood and affect. His behavior is normal. Thought content normal.   BP 120/70   Pulse 85   Temp 98.3 F (36.8 C) (Oral)   Ht 6\' 4"  (1.93 m)   Wt 181 lb (82.1 kg)   BMI 22.03 kg/m         Assessment & Plan:  1. Acute suppurative otitis media of left ear without spontaneous rupture of tympanic membrane, recurrence not specified Avoid getting water in ear If does not improve RTO for followup Meds ordered this encounter  Medications  . amoxicillin-clavulanate (AUGMENTIN) 875-125 MG tablet    Sig: Take 1 tablet by mouth 2 (two) times daily.    Dispense:  20 tablet    Refill:  0    Order Specific Question:   Supervising Provider    Answer:   Johna SheriffVINCENT, CAROL L [[id number]]   Mary-Margaret Daphine DeutscherMartin, FNP

## 2017-06-10 ENCOUNTER — Other Ambulatory Visit: Payer: Self-pay | Admitting: Family Medicine

## 2017-07-16 ENCOUNTER — Ambulatory Visit: Payer: Medicare Other | Admitting: Family Medicine

## 2017-07-17 ENCOUNTER — Encounter: Payer: Self-pay | Admitting: Family Medicine

## 2017-07-18 ENCOUNTER — Other Ambulatory Visit: Payer: Self-pay | Admitting: *Deleted

## 2017-09-02 ENCOUNTER — Other Ambulatory Visit: Payer: Self-pay | Admitting: Nurse Practitioner

## 2017-09-03 NOTE — Telephone Encounter (Signed)
Pt. Needs to be seen for this. Thanks, WS 

## 2017-09-03 NOTE — Telephone Encounter (Signed)
Patient aware he will need to be seen  

## 2017-10-02 ENCOUNTER — Ambulatory Visit: Payer: Medicare Other | Admitting: Family Medicine

## 2017-10-02 ENCOUNTER — Encounter: Payer: Self-pay | Admitting: Family Medicine

## 2017-10-02 VITALS — BP 120/68 | HR 76 | Temp 97.2°F | Ht 76.0 in | Wt 182.5 lb

## 2017-10-02 DIAGNOSIS — I25118 Atherosclerotic heart disease of native coronary artery with other forms of angina pectoris: Secondary | ICD-10-CM

## 2017-10-02 DIAGNOSIS — Z0000 Encounter for general adult medical examination without abnormal findings: Secondary | ICD-10-CM | POA: Diagnosis not present

## 2017-10-02 DIAGNOSIS — E1140 Type 2 diabetes mellitus with diabetic neuropathy, unspecified: Secondary | ICD-10-CM

## 2017-10-02 LAB — BAYER DCA HB A1C WAIVED: HB A1C: 6.1 % (ref <7.0)

## 2017-10-02 MED ORDER — METFORMIN HCL 500 MG PO TABS: ORAL_TABLET | ORAL | 1 refills | Status: DC

## 2017-10-02 MED ORDER — CLOPIDOGREL BISULFATE 75 MG PO TABS: 75.0000 mg | ORAL_TABLET | Freq: Every day | ORAL | 1 refills | Status: DC

## 2017-10-02 MED ORDER — GABAPENTIN 100 MG PO CAPS: ORAL_CAPSULE | ORAL | 3 refills | Status: DC

## 2017-10-02 NOTE — Progress Notes (Signed)
Subjective:  Patient ID: Franklin [name],  male    DOB: 09-Apr-1951  Age: 67 y.o.    CC: Medical Management of Chronic Issues   HPI Franklin [name] presents for  follow-up of hypertension. Patient has no history of headache chest pain or shortness of breath or recent cough. Patient also denies symptoms of TIA such as numbness weakness lateralizing. Patient denies side effects from medication. States taking it regularly.  Patient also  in for follow-up of elevated cholesterol. Doing well without complaints on current medication. Denies side effects  including myalgia and arthralgia and nausea. Also in today for liver function testing. Currently no chest pain, shortness of breath or other cardiovascular related symptoms noted.  Follow-up of diabetes. Patient does check blood sugar at home. Readings run between 90 and 120 Patient denies symptoms such as excessive hunger or urinary frequency, excessive hunger, nausea No significant hypoglycemic spells noted. Medications reviewed. Pt reports taking them regularly. Pt. denies complication/adverse reaction today.    History Franklin [name] has a past medical history of Charcot-Marie-Tooth disease, Coronary atherosclerosis of native coronary artery, Hearing loss, Other and unspecified hyperlipidemia, Peripheral neuropathy, Statin intolerance, and Type 2 diabetes mellitus ([place]).   He has no past surgical history on file.   His family history is not on file.He reports that he quit smoking about 12 years ago. His smoking use included cigarettes. He started smoking about 51 years ago. He has a 80.00 pack-year smoking history. He has never used smokeless tobacco. He reports that he does not drink alcohol or use drugs.  Current Outpatient Medications on File Prior to Visit  Medication Sig Dispense Refill  . Ascorbic Acid (VITAMIN C) 1000 MG tablet Take 500 mg by mouth.    Marland Kitchen aspirin EC 81 MG tablet Take 81 mg by mouth daily.      . Calcium 500-125 MG-UNIT  TABS Take 2 tablets by mouth.    . fish oil-omega-3 fatty acids 1000 MG capsule Take 2 g by mouth daily.      Marland Kitchen glucose blood (ONE TOUCH TEST STRIPS) test strip Use as instructed 100 each 12  . ONE TOUCH LANCETS MISC 1 each by Does not apply route 2 (two) times daily. 100 each 11  . vitamin C (ASCORBIC ACID) 500 MG tablet Take 500 mg by mouth daily.     No current facility-administered medications on file prior to visit.     ROS Review of Systems  Constitutional: Negative.   HENT: Positive for hearing loss.   Eyes: Negative for visual disturbance.  Respiratory: Negative for cough and shortness of breath.   Cardiovascular: Negative for chest pain and leg swelling.  Gastrointestinal: Negative for abdominal pain, diarrhea, nausea and vomiting.  Genitourinary: Negative for difficulty urinating.  Musculoskeletal: Negative for arthralgias and myalgias.  Skin: Negative for rash.  Neurological: Negative for headaches.  Psychiatric/Behavioral: Negative for sleep disturbance.    Objective:  BP 120/68   Pulse 76   Temp (!) 97.2 F (36.2 C) (Oral)   Ht _0  (1.93 m)   Wt 182 lb 8 oz (82.8 kg)   BMI 22.21 kg/m   BP Readings from Last 3 Encounters:  10/02/17 120/68  06/03/17 120/70  01/16/17 124/76    Wt Readings from Last 3 Encounters:  10/02/17 182 lb 8 oz (82.8 kg)  06/03/17 181 lb (82.1 kg)  01/16/17 191 lb (86.6 kg)     Physical Exam  Constitutional: He is oriented to person, place, and time. He  appears well-developed and well-nourished.  HENT:  Head: Normocephalic and atraumatic.  Mouth/Throat: Oropharynx is clear and moist.  Eyes: Pupils are equal, round, and reactive to light. EOM are normal.  Neck: Normal range of motion. No tracheal deviation present. No thyromegaly present.  Cardiovascular: Normal rate, regular rhythm and normal heart sounds. Exam reveals no gallop and no friction rub.  No murmur heard. Pulmonary/Chest: Breath sounds normal. He has no wheezes. He  has no rales.  Abdominal: Soft. Bowel sounds are normal. He exhibits no distension and no mass. There is no tenderness. Hernia confirmed negative in the right inguinal area and confirmed negative in the left inguinal area.  Genitourinary: Testes normal and penis normal.  Musculoskeletal: Normal range of motion. He exhibits no edema.  Lymphadenopathy:    He has no cervical adenopathy.  Neurological: He is alert and oriented to person, place, and time.  Skin: Skin is warm and dry.  Psychiatric: He has a normal mood and affect.    Diabetic Foot Exam - Simple   Simple Foot Form Diabetic Foot exam was performed with the following findings:  Yes 10/02/2017 10:07 AM  Visual Inspection No deformities, no ulcerations, no other skin breakdown bilaterally:  Yes Sensation Testing Intact to touch and monofilament testing bilaterally:  Yes Pulse Check Posterior Tibialis and Dorsalis pulse intact bilaterally:  Yes Comments       Assessment & Plan:   Tiffany was seen today for medical management of chronic issues.  Diagnoses and all orders for this visit:  Well adult exam  Type 2 diabetes mellitus with diabetic neuropathy, without long-term current use of insulin ([place]) -     CBC with Differential/Platelet -     CMP14+EGFR -     Bayer DCA Hb A1c Waived  Atherosclerosis of native coronary artery of native heart with stable angina pectoris (HCC) -     Lipid panel  Other orders -     clopidogrel (PLAVIX) 75 MG tablet; Take 1 tablet (75 mg total) by mouth daily. -     gabapentin (NEURONTIN) 100 MG capsule; Take 1 capsule by mouth 3  times a day -     metFORMIN (GLUCOPHAGE) 500 MG tablet; TAKE 1 TABLET BY MOUTH TWO  TIMES DAILY WITH MEALS   I have discontinued Franklin [name]'s metoprolol tartrate, losartan, and amoxicillin-clavulanate. I have also changed his clopidogrel. Additionally, I am having him maintain his aspirin EC, fish oil-omega-3 fatty acids, vitamin C, Calcium, vitamin C,  glucose blood, ONE TOUCH LANCETS, gabapentin, and metFORMIN.  Meds ordered this encounter  Medications  . clopidogrel (PLAVIX) 75 MG tablet    Sig: Take 1 tablet (75 mg total) by mouth daily.    Dispense:  90 tablet    Refill:  1  . gabapentin (NEURONTIN) 100 MG capsule    Sig: Take 1 capsule by mouth 3  times a day    Dispense:  270 capsule    Refill:  3  . metFORMIN (GLUCOPHAGE) 500 MG tablet    Sig: TAKE 1 TABLET BY MOUTH TWO  TIMES DAILY WITH MEALS    Dispense:  180 tablet    Refill:  1     Follow-up: Return in about 3 months (around 01/02/2018).  Claretta Fraise, M.D.

## 2017-10-03 LAB — CBC WITH DIFFERENTIAL/PLATELET
BASOS: 0 %
Basophils Absolute: 0 10*3/uL (ref 0.0–0.2)
EOS (ABSOLUTE): 0.1 10*3/uL (ref 0.0–0.4)
EOS: 1 %
HEMATOCRIT: 42.2 % (ref 37.5–51.0)
HEMOGLOBIN: 15.1 g/dL (ref 13.0–17.7)
Immature Grans (Abs): 0 10*3/uL (ref 0.0–0.1)
Immature Granulocytes: 0 %
LYMPHS ABS: 2.3 10*3/uL (ref 0.7–3.1)
Lymphs: 33 %
MCH: 30.6 pg (ref 26.6–33.0)
MCHC: 35.8 g/dL — AB (ref 31.5–35.7)
MCV: 85 fL (ref 79–97)
MONOCYTES: 9 %
Monocytes Absolute: 0.6 10*3/uL (ref 0.1–0.9)
Neutrophils Absolute: 3.9 10*3/uL (ref 1.4–7.0)
Neutrophils: 57 %
Platelets: 220 10*3/uL (ref 150–450)
RBC: 4.94 x10E6/uL (ref 4.14–5.80)
RDW: 14.3 % (ref 12.3–15.4)
WBC: 7 10*3/uL (ref 3.4–10.8)

## 2017-10-03 LAB — CMP14+EGFR
ALBUMIN: 4.9 g/dL — AB (ref 3.6–4.8)
ALK PHOS: 56 IU/L (ref 39–117)
ALT: 15 IU/L (ref 0–44)
AST: 14 IU/L (ref 0–40)
Albumin/Globulin Ratio: 2 (ref 1.2–2.2)
BUN / CREAT RATIO: 18 (ref 10–24)
BUN: 23 mg/dL (ref 8–27)
Bilirubin Total: 0.4 mg/dL (ref 0.0–1.2)
CO2: 22 mmol/L (ref 20–29)
CREATININE: 1.28 mg/dL — AB (ref 0.76–1.27)
Calcium: 9.9 mg/dL (ref 8.6–10.2)
Chloride: 101 mmol/L (ref 96–106)
GFR calc Af Amer: 67 mL/min/{1.73_m2} (ref >59)
GFR calc non Af Amer: 58 mL/min/{1.73_m2} — ABNORMAL LOW (ref >59)
GLOBULIN, TOTAL: 2.4 g/dL (ref 1.5–4.5)
Glucose: 94 mg/dL (ref 65–99)
Potassium: 4.8 mmol/L (ref 3.5–5.2)
SODIUM: 139 mmol/L (ref 134–144)
Total Protein: 7.3 g/dL (ref 6.0–8.5)

## 2017-10-03 LAB — LIPID PANEL
CHOL/HDL RATIO: 4.4 ratio (ref 0.0–5.0)
Cholesterol, Total: 142 mg/dL (ref 100–199)
HDL: 32 mg/dL — ABNORMAL LOW (ref >39)
LDL CALC: 79 mg/dL (ref 0–99)
Triglycerides: 155 mg/dL — ABNORMAL HIGH (ref 0–149)
VLDL Cholesterol Cal: 31 mg/dL (ref 5–40)

## 2018-01-01 ENCOUNTER — Telehealth: Payer: Self-pay | Admitting: *Deleted

## 2018-01-01 NOTE — Telephone Encounter (Signed)
Pt sees Dr. Wyline Mood in [place] however would like to be seen in [place] by Dr Antoine Poche b/c it is closer to his home.  OK with Dr Antoine Poche.  Will forward to Dr Wyline Mood for approval.  Pt aware of process and that he will be called once a decision has been made.  Pt appears to be overdue for f/u.

## 2018-01-01 NOTE — Telephone Encounter (Signed)
Ok with me  Franklin Shantaya Bluestone MD 

## 2018-01-02 NOTE — Telephone Encounter (Signed)
Patient scheduled to establish with Dr [place] 03/05/18 at 9:40 am.  Per wife pt needs AM appt.

## 2018-03-03 NOTE — Progress Notes (Signed)
Cardiology Office Note   Date:  03/05/2018   ID:  KRISHAV MAMONE, DOB 03-Feb-1951, MRN [id number]  PCP:  [name], MD  Cardiologist:   No primary care provider on file.   Chief Complaint  Patient presents with  . Coronary Artery Disease      History of Present Illness: Franklin [name] is a 67 y.o. male who presents for follow up of CAD.  He was seen by Dr. Wyline Mood.  He returns for routine follow-up.  He actually does very well.  He hunts and fishes.  He denies any cardiovascular symptoms other than some mild dyspnea but he is pulling a buck up a hill.  The patient denies any new symptoms such as chest discomfort, neck or arm discomfort. There has been no new shortness of breath, PND or orthopnea. There have been no reported palpitations, presyncope or syncope.  Past Medical History:  Diagnosis Date  . Charcot-Marie-Tooth disease   . Coronary atherosclerosis of native coronary artery    History of inferior myocardial infarction, treated with   CYPHER stenting of circumflex and right coronary artery, February 2004.Status post non-ST segment myocardial infarction treated with PROMUS drug-eluting stenting of distal right coronary artery and posterior descending (coronary) artery, August 2009.  EF 55-60%. Non-ischemic exercise Myoview; EF 50%, October 2009.    Marland Kitchen Hearing loss   . Other and unspecified hyperlipidemia   . Peripheral neuropathy   . Statin intolerance    Severe myalgias  . Type 2 diabetes mellitus (HCC)     History reviewed. No pertinent surgical history.   Current Outpatient Medications  Medication Sig Dispense Refill  . Ascorbic Acid (VITAMIN C) 1000 MG tablet Take 500 mg by mouth.    Marland Kitchen aspirin EC 81 MG tablet Take 81 mg by mouth daily.      . clopidogrel (PLAVIX) 75 MG tablet Take 1 tablet (75 mg total) by mouth daily. 90 tablet 1  . fish oil-omega-3 fatty acids 1000 MG capsule Take 2 g by mouth daily.      Marland Kitchen gabapentin (NEURONTIN) 100 MG capsule Take  100 mg by mouth daily.     . metFORMIN (GLUCOPHAGE) 500 MG tablet TAKE 1 TABLET BY MOUTH TWO  TIMES DAILY WITH MEALS 180 tablet 1  . glucose blood (ONE TOUCH TEST STRIPS) test strip Use as instructed 100 each 12  . ONE TOUCH LANCETS MISC 1 each by Does not apply route 2 (two) times daily. 100 each 11   No current facility-administered medications for this visit.     Allergies:   Statins    ROS:  Please see the history of present illness.   Otherwise, review of systems are positive for neuropathy.   All other systems are reviewed and negative.    PHYSICAL EXAM: VS:  BP 140/72   Pulse 72   Ht 6\' 5"  (1.956 m)   Wt 183 lb (83 kg)   BMI 21.70 kg/m  , BMI Body mass index is 21.7 kg/m. GENERAL:  Well appearing NECK:  No jugular venous distention, waveform within normal limits, carotid upstroke brisk and symmetric, no bruits, no thyromegaly LUNGS:  Clear to auscultation bilaterally CHEST:  Unremarkable HEART:  PMI not displaced or sustained,S1 and S2 within normal limits, no S3, no S4, no clicks, no rubs, no murmurs ABD:  Flat, positive bowel sounds normal in frequency in pitch, no bruits, no rebound, no guarding, no midline pulsatile mass, no hepatomegaly, no splenomegaly EXT:  2 plus pulses  throughout, no edema, no cyanosis no clubbing   EKG:  EKG is ordered today. The ekg ordered today demonstrates sinus rhythm, rate 64, left axis deviation, left anterior fascicular block, intervals within normal limits, no acute ST-T wave changes.   Recent Labs: 10/02/2017: ALT 15; BUN 23; Creatinine, Ser 1.28; Hemoglobin 15.1; Platelets 220; Potassium 4.8; Sodium 139    Lipid Panel    Component Value Date/Time   CHOL 142 10/02/2017 1110   CHOL 159 10/29/2012 0941   TRIG 155 (H) 10/02/2017 1110   TRIG 170 (H) 10/29/2012 0941   HDL 32 (L) 10/02/2017 1110   HDL 30 (L) 10/29/2012 0941   CHOLHDL 4.4 10/02/2017 1110   CHOLHDL 5.7 12/03/2007 0100   VLDL 27 12/03/2007 0100   LDLCALC 79  10/02/2017 1110   LDLCALC 95 10/29/2012 0941     Wt Readings from Last 3 Encounters:  03/05/18 183 lb (83 kg)  10/02/17 182 lb 8 oz (82.8 kg)  06/03/17 181 lb (82.1 kg)      Other studies Reviewed: Additional studies/ records that were reviewed today include: Labs. Review of the above records demonstrates:  Please see elsewhere in the note.     ASSESSMENT AND PLAN:  CAD:  The patient has no new sypmtoms.  No further cardiovascular testing is indicated.  We will continue with aggressive risk reduction and meds as listed.  DM:    A1C was 6.1.    No change in therapy.   HTN: His blood pressure is very slightly elevated but this is unusual and I did check previous readings.    DYSLIPIDEMIA:  LDL was 79.  I would not suggest a change in therapy.    TOBACCO:  He needs to stop smoking.  He picked this up recently when his wife was sick and we talked about the need to stop smoking.    Current medicines are reviewed at length with the patient today.  The patient does not have concerns regarding medicines.  The following changes have been made:  no change  Labs/ tests ordered today include: None  Orders Placed This Encounter  Procedures  . EKG 12-Lead     Disposition:   FU with me in one year.     Signed, Rollene Rotunda, MD  03/05/2018 10:20 AM    [facility]

## 2018-03-05 ENCOUNTER — Encounter: Payer: Self-pay | Admitting: Cardiology

## 2018-03-05 ENCOUNTER — Ambulatory Visit (INDEPENDENT_AMBULATORY_CARE_PROVIDER_SITE_OTHER): Payer: Medicare Other | Admitting: Cardiology

## 2018-03-05 VITALS — BP 140/72 | HR 72 | Ht 77.0 in | Wt 183.0 lb

## 2018-03-05 DIAGNOSIS — I2510 Atherosclerotic heart disease of native coronary artery without angina pectoris: Secondary | ICD-10-CM | POA: Diagnosis not present

## 2018-03-05 DIAGNOSIS — I10 Essential (primary) hypertension: Secondary | ICD-10-CM | POA: Diagnosis not present

## 2018-03-05 NOTE — Patient Instructions (Signed)
Medication Instructions:  The current medical regimen is effective;  continue present plan and medications.  If you need a refill on your cardiac medications before your next appointment, please call your pharmacy.   Follow-Up: Follow up in 1 year with Dr. Hochrein in [place].  You will receive a letter in the mail 2 months before you are due.  Please call us when you receive this letter to schedule your follow up appointment.  Thank you for choosing [facility]!!     

## 2018-03-17 ENCOUNTER — Other Ambulatory Visit: Payer: Self-pay | Admitting: Family Medicine

## 2018-03-17 NOTE — Telephone Encounter (Signed)
Last seen 10/17/17  Dr Darlyn ReadStacks

## 2018-04-04 ENCOUNTER — Ambulatory Visit: Payer: Medicare Other | Admitting: Family Medicine

## 2018-05-15 ENCOUNTER — Ambulatory Visit: Payer: Medicare Other | Admitting: Family Medicine

## 2018-05-20 ENCOUNTER — Encounter: Payer: Self-pay | Admitting: Family Medicine

## 2018-05-20 ENCOUNTER — Ambulatory Visit (INDEPENDENT_AMBULATORY_CARE_PROVIDER_SITE_OTHER): Payer: Medicare Other | Admitting: Family Medicine

## 2018-05-20 VITALS — BP 125/73 | HR 85 | Temp 97.7°F | Ht 77.0 in | Wt 180.1 lb

## 2018-05-20 DIAGNOSIS — J329 Chronic sinusitis, unspecified: Secondary | ICD-10-CM | POA: Diagnosis not present

## 2018-05-20 DIAGNOSIS — J40 Bronchitis, not specified as acute or chronic: Secondary | ICD-10-CM | POA: Diagnosis not present

## 2018-05-20 DIAGNOSIS — E1140 Type 2 diabetes mellitus with diabetic neuropathy, unspecified: Secondary | ICD-10-CM

## 2018-05-20 DIAGNOSIS — E782 Mixed hyperlipidemia: Secondary | ICD-10-CM

## 2018-05-20 LAB — BAYER DCA HB A1C WAIVED: HB A1C (BAYER DCA - WAIVED): 6.1 % (ref <7.0)

## 2018-05-20 MED ORDER — CLOPIDOGREL BISULFATE 75 MG PO TABS: 75.0000 mg | ORAL_TABLET | Freq: Every day | ORAL | 1 refills | Status: DC

## 2018-05-20 MED ORDER — PSEUDOEPHEDRINE-GUAIFENESIN ER 120-1200 MG PO TB12: 1.0000 | ORAL_TABLET | Freq: Two times a day (BID) | ORAL | 0 refills | Status: DC

## 2018-05-20 MED ORDER — GLUCOSE BLOOD VI STRP: ORAL_STRIP | 12 refills | Status: DC

## 2018-05-20 MED ORDER — AMOXICILLIN-POT CLAVULANATE 875-125 MG PO TABS: 1.0000 | ORAL_TABLET | Freq: Two times a day (BID) | ORAL | 0 refills | Status: DC

## 2018-05-20 MED ORDER — METFORMIN HCL 500 MG PO TABS: ORAL_TABLET | ORAL | 1 refills | Status: DC

## 2018-05-20 MED ORDER — ONETOUCH LANCETS MISC: 1.0000 | Freq: Two times a day (BID) | 11 refills | Status: AC

## 2018-05-20 NOTE — Progress Notes (Signed)
Subjective:  Patient ID: Franklin [name],  male    DOB: 1950-09-18  Age: 68 y.o.    CC: Medical Management of Chronic Issues   HPI RAYLYN SPECKMAN presents for  follow-up of elevated cholesterol. Doing well without complaints on current medication. Denies side effects  including myalgia and arthralgia and nausea. Also in today for liver function testing. Currently no chest pain, shortness of breath or other cardiovascular related symptoms noted.  Follow-up of diabetes. Patient does check blood sugar at home. Readings run between 100 and 150 Patient denies symptoms such as excessive hunger or urinary frequency, excessive hunger, nausea No significant hypoglycemic spells noted. Medications reviewed. Pt reports taking them regularly. Pt. denies complication/adverse reaction today.  Symptoms include congestion, facial pain, nasal congestion, non productive cough, post nasal drip and sinus pressure. There is no fever, chills, or sweats. Onset of symptoms was a few days ago, gradually worsening since that time.    History Isaah has a past medical history of Charcot-Marie-Tooth disease, Coronary atherosclerosis of native coronary artery, Hearing loss, Other and unspecified hyperlipidemia, Peripheral neuropathy, Statin intolerance, and Type 2 diabetes mellitus ([place]).   He has no past surgical history on file.   His family history is not on file.He reports that he has been smoking cigarettes. He started smoking about 52 years ago. He has a 80.00 pack-year smoking history. He has never used smokeless tobacco. He reports that he does not drink alcohol or use drugs.  Current Outpatient Medications on File Prior to Visit  Medication Sig Dispense Refill  . Ascorbic Acid (VITAMIN C) 1000 MG tablet Take 500 mg by mouth.    Marland Kitchen aspirin EC 81 MG tablet Take 81 mg by mouth daily.      . fish oil-omega-3 fatty acids 1000 MG capsule Take 2 g by mouth daily.      Marland Kitchen gabapentin (NEURONTIN) 100 MG capsule  Take 100 mg by mouth daily.      No current facility-administered medications on file prior to visit.     ROS Review of Systems  Constitutional: Negative.   HENT: Negative.   Eyes: Negative for visual disturbance.  Respiratory: Negative for cough and shortness of breath.   Cardiovascular: Negative for chest pain and leg swelling.  Gastrointestinal: Negative for abdominal pain, diarrhea, nausea and vomiting.  Genitourinary: Negative for difficulty urinating.  Musculoskeletal: Negative for arthralgias and myalgias.  Skin: Negative for rash.  Neurological: Negative for headaches.  Psychiatric/Behavioral: Negative for sleep disturbance.    Objective:  BP 125/73   Pulse 85   Temp 97.7 F (36.5 C) (Oral)   Ht '6\' 5"'$  (1.956 m)   Wt 180 lb 2 Franklin (81.7 kg)   BMI 21.36 kg/m   BP Readings from Last 3 Encounters:  05/20/18 125/73  03/05/18 140/72  10/02/17 120/68    Wt Readings from Last 3 Encounters:  05/20/18 180 lb 2 Franklin (81.7 kg)  03/05/18 183 lb (83 kg)  10/02/17 182 lb 8 Franklin (82.8 kg)     Physical Exam Vitals signs reviewed.  Constitutional:      Appearance: He is well-developed.  HENT:     Head: Normocephalic and atraumatic.     Right Ear: Tympanic membrane and external ear normal. No decreased hearing noted.     Left Ear: Tympanic membrane and external ear normal. No decreased hearing noted.     Mouth/Throat:     Pharynx: No oropharyngeal exudate or posterior oropharyngeal erythema.  Eyes:  Pupils: Pupils are equal, round, and reactive to light.  Neck:     Musculoskeletal: Normal range of motion and neck supple.  Cardiovascular:     Rate and Rhythm: Normal rate and regular rhythm.     Heart sounds: No murmur.  Pulmonary:     Effort: No respiratory distress.     Breath sounds: Normal breath sounds.  Abdominal:     General: Bowel sounds are normal.     Palpations: Abdomen is soft. There is no mass.     Tenderness: There is no abdominal tenderness.      Diabetic Foot Exam - Simple   No data filed        Assessment & Plan:   Arbie was seen today for medical management of chronic issues.  Diagnoses and all orders for this visit:  Type 2 diabetes mellitus with diabetic neuropathy, without long-term current use of insulin (HCC) -     CBC with Differential/Platelet -     CMP14+EGFR -     Bayer DCA Hb A1c Waived  Mixed hyperlipidemia -     Lipid panel  Sinobronchitis  Other orders -     clopidogrel (PLAVIX) 75 MG tablet; Take 1 tablet (75 mg total) by mouth daily. -     metFORMIN (GLUCOPHAGE) 500 MG tablet; TAKE 1 TABLET BY MOUTH TWO  TIMES DAILY WITH MEALS -     ONE TOUCH LANCETS MISC; 1 each by Does not apply route 2 (two) times daily. -     Discontinue: glucose blood (ONE TOUCH TEST STRIPS) test strip; Use as instructed -     amoxicillin-clavulanate (AUGMENTIN) 875-125 MG tablet; Take 1 tablet by mouth 2 (two) times daily. Take all of this medication -     Pseudoephedrine-Guaifenesin [phone] MG TB12; Take 1 tablet by mouth 2 (two) times daily. For congestion   I have discontinued Jovontae B. Crabtree's glucose blood. I have also changed his clopidogrel. Additionally, I am having him start on amoxicillin-clavulanate and Pseudoephedrine-Guaifenesin. Lastly, I am having him maintain his aspirin EC, fish oil-omega-3 fatty acids, vitamin C, gabapentin, metFORMIN, and ONE TOUCH LANCETS.  Meds ordered this encounter  Medications  . clopidogrel (PLAVIX) 75 MG tablet    Sig: Take 1 tablet (75 mg total) by mouth daily.    Dispense:  90 tablet    Refill:  1  . metFORMIN (GLUCOPHAGE) 500 MG tablet    Sig: TAKE 1 TABLET BY MOUTH TWO  TIMES DAILY WITH MEALS    Dispense:  180 tablet    Refill:  1  . ONE TOUCH LANCETS MISC    Sig: 1 each by Does not apply route 2 (two) times daily.    Dispense:  100 each    Refill:  11  . DISCONTD: glucose blood (ONE TOUCH TEST STRIPS) test strip    Sig: Use as instructed    Dispense:  100 each     Refill:  12  . amoxicillin-clavulanate (AUGMENTIN) 875-125 MG tablet    Sig: Take 1 tablet by mouth 2 (two) times daily. Take all of this medication    Dispense:  20 tablet    Refill:  0  . Pseudoephedrine-Guaifenesin [phone] MG TB12    Sig: Take 1 tablet by mouth 2 (two) times daily. For congestion    Dispense:  20 each    Refill:  0     Follow-up: Return in about 3 months (around 08/19/2018).  Claretta Fraise, M.D.

## 2018-05-21 ENCOUNTER — Ambulatory Visit: Payer: Medicare Other | Admitting: Family Medicine

## 2018-05-21 LAB — CBC WITH DIFFERENTIAL/PLATELET
Basophils Absolute: 0 10*3/uL (ref 0.0–0.2)
Basos: 0 %
EOS (ABSOLUTE): 0.1 10*3/uL (ref 0.0–0.4)
Eos: 1 %
Hematocrit: 43 % (ref 37.5–51.0)
Hemoglobin: 14.8 g/dL (ref 13.0–17.7)
Immature Grans (Abs): 0 10*3/uL (ref 0.0–0.1)
Immature Granulocytes: 0 %
Lymphocytes Absolute: 2.5 10*3/uL (ref 0.7–3.1)
Lymphs: 31 %
MCH: 29.9 pg (ref 26.6–33.0)
MCHC: 34.4 g/dL (ref 31.5–35.7)
MCV: 87 fL (ref 79–97)
Monocytes Absolute: 0.7 10*3/uL (ref 0.1–0.9)
Monocytes: 9 %
Neutrophils Absolute: 4.7 10*3/uL (ref 1.4–7.0)
Neutrophils: 59 %
Platelets: 267 10*3/uL (ref 150–450)
RBC: 4.95 x10E6/uL (ref 4.14–5.80)
RDW: 12.6 % (ref 11.6–15.4)
WBC: 8 10*3/uL (ref 3.4–10.8)

## 2018-05-21 LAB — CMP14+EGFR
ALT: 23 IU/L (ref 0–44)
AST: 19 IU/L (ref 0–40)
Albumin/Globulin Ratio: 2 (ref 1.2–2.2)
Albumin: 4.7 g/dL (ref 3.8–4.8)
Alkaline Phosphatase: 49 IU/L (ref 39–117)
BUN/Creatinine Ratio: 16 (ref 10–24)
BUN: 19 mg/dL (ref 8–27)
Bilirubin Total: 0.3 mg/dL (ref 0.0–1.2)
CO2: 20 mmol/L (ref 20–29)
Calcium: 9.7 mg/dL (ref 8.6–10.2)
Chloride: 100 mmol/L (ref 96–106)
Creatinine, Ser: 1.19 mg/dL (ref 0.76–1.27)
GFR calc Af Amer: 72 mL/min/{1.73_m2} (ref >59)
GFR calc non Af Amer: 62 mL/min/{1.73_m2} (ref >59)
Globulin, Total: 2.4 g/dL (ref 1.5–4.5)
Glucose: 77 mg/dL (ref 65–99)
Potassium: 4.3 mmol/L (ref 3.5–5.2)
Sodium: 139 mmol/L (ref 134–144)
Total Protein: 7.1 g/dL (ref 6.0–8.5)

## 2018-05-21 LAB — LIPID PANEL
Chol/HDL Ratio: 4.8 ratio (ref 0.0–5.0)
Cholesterol, Total: 144 mg/dL (ref 100–199)
HDL: 30 mg/dL — ABNORMAL LOW (ref >39)
LDL Calculated: 53 mg/dL (ref 0–99)
Triglycerides: 306 mg/dL — ABNORMAL HIGH (ref 0–149)
VLDL Cholesterol Cal: 61 mg/dL — ABNORMAL HIGH (ref 5–40)

## 2018-05-23 ENCOUNTER — Other Ambulatory Visit: Payer: Self-pay

## 2018-05-23 MED ORDER — GLUCOSE BLOOD VI STRP: ORAL_STRIP | 12 refills | Status: AC

## 2018-05-27 ENCOUNTER — Encounter: Payer: Self-pay | Admitting: Family Medicine

## 2018-05-27 ENCOUNTER — Other Ambulatory Visit: Payer: Self-pay | Admitting: *Deleted

## 2018-05-27 MED ORDER — ONETOUCH ULTRA 2 W/DEVICE KIT: PACK | 0 refills | Status: DC

## 2018-06-13 ENCOUNTER — Encounter: Payer: Self-pay | Admitting: *Deleted

## 2018-06-23 ENCOUNTER — Ambulatory Visit (INDEPENDENT_AMBULATORY_CARE_PROVIDER_SITE_OTHER): Payer: Medicare Other | Admitting: Otolaryngology

## 2018-06-23 DIAGNOSIS — H903 Sensorineural hearing loss, bilateral: Secondary | ICD-10-CM | POA: Diagnosis not present

## 2018-06-27 ENCOUNTER — Other Ambulatory Visit: Payer: Self-pay | Admitting: Otolaryngology

## 2018-06-27 DIAGNOSIS — Z9621 Cochlear implant status: Secondary | ICD-10-CM

## 2018-07-04 ENCOUNTER — Ambulatory Visit (HOSPITAL_COMMUNITY)
Admission: RE | Admit: 2018-07-04 | Discharge: 2018-07-04 | Disposition: A | Discharge status: Home / Self Care | Payer: Medicare Other | Source: Ambulatory Visit | Attending: Otolaryngology | Admitting: Otolaryngology

## 2018-07-04 ENCOUNTER — Other Ambulatory Visit: Payer: Self-pay

## 2018-07-04 DIAGNOSIS — H9319 Tinnitus, unspecified ear: Secondary | ICD-10-CM | POA: Diagnosis not present

## 2018-07-04 DIAGNOSIS — Z9621 Cochlear implant status: Secondary | ICD-10-CM | POA: Insufficient documentation

## 2018-09-01 ENCOUNTER — Other Ambulatory Visit: Payer: Self-pay

## 2018-09-01 ENCOUNTER — Ambulatory Visit (INDEPENDENT_AMBULATORY_CARE_PROVIDER_SITE_OTHER): Payer: Medicare Other | Admitting: *Deleted

## 2018-09-01 DIAGNOSIS — Z0000 Encounter for general adult medical examination without abnormal findings: Secondary | ICD-10-CM | POA: Diagnosis not present

## 2018-09-01 NOTE — Progress Notes (Addendum)
MEDICARE ANNUAL WELLNESS VISIT  09/01/2018  Telephone Visit Disclaimer This Medicare AWV was conducted by telephone due to national recommendations for restrictions regarding the COVID-19 Pandemic (e.g. social distancing).  I verified, using two identifiers, that I am speaking with Franklin [name] or their authorized healthcare agent. I discussed the limitations, risks, security, and privacy concerns of performing an evaluation and management service by telephone and the potential availability of an in-person appointment in the future. The patient expressed understanding and agreed to proceed.   Subjective:  Franklin [name] is a 68 y.o. male patient of Stacks, Cletus Gash, MD who had a Medicare Annual Wellness Visit today via telephone. Bradely is Retired and lives with their spouse. he has 5 children. he reports that he is socially active and does interact with friends/family regularly. he is minimally physically active and enjoys fishing.  Patient Care Team: Claretta Fraise, MD as PCP - General (Family Medicine)  Advanced Directives 09/01/2018  Does Patient Have a Medical Advance Directive? No  Would patient like information on creating a medical advance directive? Yes (MAU/Ambulatory/Procedural Areas - Information given)    Hospital Utilization Over the Past 12 Months: # of hospitalizations or ER visits: 0 # of surgeries: 0  Review of Systems    Patient reports that his overall health is unchanged compared to last year.  Patient Reported Readings (BP, Pulse, CBG, Weight, etc) CBG-137  Review of Systems: History obtained from chart review  All other systems negative.  Pain Assessment Pain : No/denies pain     Current Medications & Allergies (verified) Allergies as of 09/01/2018      Reactions   Statins    REACTION: myalgia      Medication List       Accurate as of Sep 01, 2018 11:02 AM. If you have any questions, ask your nurse or doctor.        STOP taking these  medications   amoxicillin-clavulanate 875-125 MG tablet Commonly known as:  AUGMENTIN     TAKE these medications   aspirin EC 81 MG tablet Take 81 mg by mouth daily.   clopidogrel 75 MG tablet Commonly known as:  PLAVIX Take 1 tablet (75 mg total) by mouth daily.   fish oil-omega-3 fatty acids 1000 MG capsule Take 2 g by mouth daily.   gabapentin 100 MG capsule Commonly known as:  NEURONTIN Take 100 mg by mouth daily.   glucose blood test strip Commonly known as:  ONE TOUCH TEST STRIPS Test 3 times daily   E11.9   metFORMIN 500 MG tablet Commonly known as:  GLUCOPHAGE TAKE 1 TABLET BY MOUTH TWO  TIMES DAILY WITH MEALS   ONE TOUCH LANCETS Misc 1 each by Does not apply route 2 (two) times daily.   ONE TOUCH ULTRA 2 w/Device Kit Test sugars 3 times daily   Pseudoephedrine-Guaifenesin [phone] MG Tb12 Take 1 tablet by mouth 2 (two) times daily. For congestion   vitamin C 1000 MG tablet Take 500 mg by mouth.       History (reviewed): Past Medical History:  Diagnosis Date  . Charcot-Marie-Tooth disease   . Coronary atherosclerosis of native coronary artery    History of inferior myocardial infarction, treated with   CYPHER stenting of circumflex and right coronary artery, February 2004.Status post non-ST segment myocardial infarction treated with PROMUS drug-eluting stenting of distal right coronary artery and posterior descending (coronary) artery, August 2009.  EF 55-60%. Non-ischemic exercise Myoview; EF 50%, October 2009.    Marland Kitchen  Hearing loss   . Other and unspecified hyperlipidemia   . Peripheral neuropathy   . Statin intolerance    Severe myalgias  . Type 2 diabetes mellitus ([place])    History reviewed. No pertinent surgical history. History reviewed. No pertinent family history. Social History   Socioeconomic History  . Marital status: Married    Spouse name: [name]  . Number of children: 5  . Years of education: Not on file  . Highest education level:  12th grade  Occupational History  . Occupation: Retired  Scientific laboratory technician  . Financial resource strain: Not hard at all  . Food insecurity:    Worry: Never true    Inability: Never true  . Transportation needs:    Medical: No    Non-medical: No  Tobacco Use  . Smoking status: Current Some Day Smoker    Packs/day: 2.00    Years: 40.00    Pack years: 80.00    Types: Cigarettes    Start date: 12/05/1965  . Smokeless tobacco: Never Used  . Tobacco comment: Did quit for a while but started again.  Not as heavy  Substance and Sexual Activity  . Alcohol use: No    Alcohol/week: 0.0 standard drinks  . Drug use: No  . Sexual activity: Yes    Partners: Male  Lifestyle  . Physical activity:    Days per week: 0 days    Minutes per session: 0 min  . Stress: Not at all  Relationships  . Social connections:    Talks on phone: More than three times a week    Gets together: More than three times a week    Attends religious service: Never    Active member of club or organization: No    Attends meetings of clubs or organizations: Never    Relationship status: Married  Other Topics Concern  . Not on file  Social History Narrative  . Not on file    Activities of Daily Living In your present state of health, do you have any difficulty performing the following activities: 09/01/2018  Hearing? Y  Vision? N  Difficulty concentrating or making decisions? N  Walking or climbing stairs? N  Dressing or bathing? N  Doing errands, shopping? N  Preparing Food and eating ? N  Using the Toilet? N  In the past six months, have you accidently leaked urine? N  Do you have problems with loss of bowel control? N  Managing your Medications? N  Managing your Finances? N  Housekeeping or managing your Housekeeping? N  Some recent data might be hidden    Patient Literacy How often do you need to have someone help you when you read instructions, pamphlets, or other written materials from your doctor or  pharmacy?: 1 - Never What is the last grade level you completed in school?: 12th Grade  Exercise Current Exercise Habits: The patient does not participate in regular exercise at present, Exercise limited by: None identified  Diet Patient reports consuming 3 meals a day and 4 snack(s) a day Patient reports that his primary diet is: Regular Patient reports that she does have regular access to food.   Depression Screen PHQ 2/9 Scores 09/01/2018 05/20/2018 06/03/2017 01/16/2017 10/28/2015 10/20/2013  PHQ - 2 Score 0 0 0 0 0 0     Fall Risk Fall Risk  09/01/2018 05/20/2018 06/03/2017 01/16/2017 10/28/2015  Falls in the past year? 0 0 No No No  Number falls in past yr: - - - - -  Risk for fall due to : - - - - -     Objective:  Franklin [name] seemed alert and oriented and he participated appropriately during our telephone visit.  Blood Pressure Weight BMI  BP Readings from Last 3 Encounters:  05/20/18 125/73  03/05/18 140/72  10/02/17 120/68   Wt Readings from Last 3 Encounters:  05/20/18 180 lb 2 oz (81.7 kg)  03/05/18 183 lb (83 kg)  10/02/17 182 lb 8 oz (82.8 kg)   BMI Readings from Last 1 Encounters:  05/20/18 21.36 kg/m    *Unable to obtain current vital signs, weight, and BMI due to telephone visit type  Hearing/Vision  . Cleon did  seem to have difficulty with hearing/understanding during the telephone conversation . Reports that he has not had a formal eye exam by an eye care professional within the past year . Reports that he has had a formal hearing evaluation within the past year *Unable to fully assess hearing and vision during telephone visit type  Cognitive Function: 6CIT Screen 09/01/2018  What Year? 0 points  What month? 0 points  What time? 0 points  Count back from 20 0 points  Months in reverse 0 points  Repeat phrase 0 points  Total Score 0    Normal Cognitive Function Screening: Yes (Normal:0-7, Significant for Dysfunction: >8)  Immunization & Health  Maintenance Record Immunization History  Administered Date(s) Administered  . Tetanus 04/23/2008    Health Maintenance  Topic Date Due  . Hepatitis C Screening  May 15, 1950  . OPHTHALMOLOGY EXAM  12/06/1959  . COLONOSCOPY  12/06/1999  . PNA vac Low Risk Adult (1 of 2 - PCV13) 12/06/2014  . URINE MICROALBUMIN  01/16/2018  . TETANUS/TDAP  04/23/2018  . FOOT EXAM  10/03/2018  . HEMOGLOBIN A1C  11/18/2018  . INFLUENZA VACCINE  Discontinued       Assessment  This is a routine wellness examination for Franklin [name].  Health Maintenance: Due or Overdue Health Maintenance Due  Topic Date Due  . Hepatitis C Screening  November 24, 1950  . OPHTHALMOLOGY EXAM  12/06/1959  . COLONOSCOPY  12/06/1999  . PNA vac Low Risk Adult (1 of 2 - PCV13) 12/06/2014  . URINE MICROALBUMIN  01/16/2018  . TETANUS/TDAP  04/23/2018    Franklin [name] does not need a referral for Community Assistance: Care Management:   no Social Work:    no Prescription Assistance:  no Nutrition/Diabetes Education:  no   Plan:  Personalized Goals Goals Addressed            This Visit's Progress   . Quit Smoking        Personalized Health Maintenance & Screening Recommendations  Pneumococcal vaccine  Td vaccine Colorectal cancer screening Glaucoma screening Smoking cessation counseling  Lung Cancer Screening Recommended: yes (Low Dose CT Chest recommended if Age 41-80 years, 30 pack-year currently smoking OR have quit w/in past 15 years) Hepatitis C Screening recommended: yes HIV Screening recommended: yes  Advanced Directives: Written information was prepared per patient's request.  Referrals & Orders No orders of the defined types were placed in this encounter.   Follow-up Plan . Follow-up with Claretta Fraise, MD as planned . Schedule eye exam . Discuss tdap, shingrix vaccine and colonoscopy at next office visit.   I have personally reviewed and noted the following in the patient's chart:   .  Medical and social history . Use of alcohol, tobacco or illicit drugs  . Current medications and supplements .  Functional ability and status . Nutritional status . Physical activity . Advanced directives . List of other physicians . Hospitalizations, surgeries, and ER visits in previous 12 months . Vitals . Screenings to include cognitive, depression, and falls . Referrals and appointments  In addition, I have reviewed and discussed with Franklin [name] certain preventive protocols, quality metrics, and best practice recommendations. A written personalized care plan for preventive services as well as general preventive health recommendations is available and can be mailed to the patient at his request.     Truett Mainland, LPN  signature  [id number]   I have reviewed and agree with the above AWV documentation.   Mary-Margaret Hassell Done, FNP

## 2018-09-01 NOTE — Patient Instructions (Signed)
Franklin [name] , Thank you for taking time to come for your Medicare Wellness Visit. I appreciate your ongoing commitment to your health goals. Please review the following plan we discussed and let me know if I can assist you in the future.   These are the goals we discussed: Goals     Quit Smoking       This is a list of the screening recommended for you and due dates:  Health Maintenance  Topic Date Due    Hepatitis C: One time screening is recommended by Center for Disease Control  (CDC) for  adults born from [birth year] through 1966.   Jul 26, 1950   Eye exam for diabetics  12/06/1959   Colon Cancer Screening  12/06/1999   Pneumonia vaccines (1 of 2 - PCV13) 12/06/2014   Urine Protein Check  01/16/2018   Tetanus Vaccine  04/23/2018   Complete foot exam   10/03/2018   Hemoglobin A1C  11/18/2018   Flu Shot  Discontinued     Smoking Tobacco Information, Adult Smoking tobacco can be harmful to your health. Tobacco contains a poisonous (toxic), colorless chemical called nicotine. Nicotine is addictive. It changes the brain and can make it hard to stop smoking. Tobacco also has other toxic chemicals that can hurt your body and raise your risk of many cancers. How can smoking tobacco affect me? Smoking tobacco puts you at risk for:  Cancer. Smoking is most commonly associated with lung cancer, but can also lead to cancer in other parts of the body.  Chronic obstructive pulmonary disease (COPD). This is a long-term lung condition that makes it hard to breathe. It also gets worse over time.  High blood pressure (hypertension), heart disease, stroke, or heart attack.  Lung infections, such as pneumonia.  Cataracts. This is when the lenses in the eyes become clouded.  Digestive problems. This may include peptic ulcers, heartburn, and gastroesophageal reflux disease (GERD).  Oral health problems, such as gum disease and tooth loss.  Loss of taste and smell. Smoking can affect  your appearance by causing:  Wrinkles.  Yellow or stained teeth, fingers, and fingernails. Smoking tobacco can also affect your social life, because:  It may be challenging to find places to smoke when away from home. Many workplaces, [employer], hotels, and public places are tobacco-free.  Smoking is expensive. This is due to the cost of tobacco and the long-term costs of treating health problems from smoking.  Secondhand smoke may affect those around you. Secondhand smoke can cause lung cancer, breathing problems, and heart disease. Children of smokers have a higher risk for: ? Sudden infant death syndrome (SIDS). ? Ear infections. ? Lung infections. If you currently smoke tobacco, quitting now can help you:  Lead a longer and healthier life.  Look, smell, breathe, and feel better over time.  Save money.  Protect others from the harms of secondhand smoke. What actions can I take to prevent health problems? Quit smoking   Do not start smoking. Quit if you already do.  Make a plan to quit smoking and commit to it. Look for programs to help you and ask your health care provider for recommendations and ideas.  Set a date and write down all the reasons you want to quit.  Let your friends and family know you are quitting so they can help and support you. Consider finding friends who also want to quit. It can be easier to quit with someone else, so that you can support each other.  Talk with your health care provider about using nicotine replacement medicines to help you quit, such as gum, lozenges, patches, sprays, or pills.  Do not replace cigarette smoking with electronic cigarettes, which are commonly called e-cigarettes. The safety of e-cigarettes is not known, and some may contain harmful chemicals.  If you try to quit but return to smoking, stay positive. It is common to slip up when you first quit, so take it one day at a time.  Be prepared for cravings. When you feel  the urge to smoke, chew gum or suck on hard candy. Lifestyle  Stay busy and take care of your body.  Drink enough fluid to keep your urine pale yellow.  Get plenty of exercise and eat a healthy diet. This can help prevent weight gain after quitting.  Monitor your eating habits. Quitting smoking can cause you to have a larger appetite than when you smoke.  Find ways to relax. Go out with friends or family to a movie or a restaurant where people do not smoke.  Ask your health care provider about having regular tests (screenings) to check for cancer. This may include blood tests, imaging tests, and other tests.  Find ways to manage your stress, such as meditation, yoga, or exercise. Where to find support To get support to quit smoking, consider:  Asking your health care provider for more information and resources.  Taking classes to learn more about quitting smoking.  Looking for local organizations that offer resources about quitting smoking.  Joining a support group for people who want to quit smoking in your local community.  Calling the smokefree.gov counselor helpline: 1-800-Quit-Now (331)488-7875) Where to find more information You may find more information about quitting smoking from:  HelpGuide.org: www.helpguide.org  BankRights[employer]: smokefree.gov  American Lung Association: www.lung.org Contact a health care provider if you:  Have problems breathing.  Notice that your lips, nose, or fingers turn blue.  Have chest pain.  Are coughing up blood.  Feel faint or you pass out.  Have other health changes that cause you to worry. Summary  Smoking tobacco can negatively affect your health, the health of those around you, your finances, and your social life.  Do not start smoking. Quit if you already do. If you need help quitting, ask your health care provider.  Think about joining a support group for people who want to quit smoking in your local community. There  are many effective programs that will help you to quit this behavior. This information is not intended to replace advice given to you by your health care provider. Make sure you discuss any questions you have with your health care provider. Document Released: 04/24/2016 Document Revised: 05/29/2017 Document Reviewed: 04/24/2016 Elsevier Interactive Patient Education  2019 [employer].  Advance Directive  Advance directives are legal documents that let you make choices ahead of time about your health care and medical treatment in case you become unable to communicate for yourself. Advance directives are a way for you to communicate your wishes to family, friends, and health care providers. This can help convey your decisions about end-of-life care if you become unable to communicate. Discussing and writing advance directives should happen over time rather than all at once. Advance directives can be changed depending on your situation and what you want, even after you have signed the advance directives. If you do not have an advance directive, some states assign family decision makers to act on your behalf based on how closely you are  related to them. Each state has its own laws regarding advance directives. You may want to check with your health care provider, attorney, or state representative about the laws in your state. There are different types of advance directives, such as:  Medical power of attorney.  Living will.  Do not resuscitate (DNR) or do not attempt resuscitation (DNAR) order. Health care proxy and medical power of attorney A health care proxy, also called a health care agent, is a person who is appointed to make medical decisions for you in cases in which you are unable to make the decisions yourself. Generally, people choose someone they know well and trust to represent their preferences. Make sure to ask this person for an agreement to act as your proxy. A proxy may have to  exercise judgment in the event of a medical decision for which your wishes are not known. A medical power of attorney is a legal document that names your health care proxy. Depending on the laws in your state, after the document is written, it may also need to be:  Signed.  Notarized.  Dated.  Copied.  Witnessed.  Incorporated into your medical record. You may also want to appoint someone to manage your financial affairs in a situation in which you are unable to do so. This is called a durable power of attorney for finances. It is a separate legal document from the durable power of attorney for health care. You may choose the same person or someone different from your health care proxy to act as your agent in financial matters. If you do not appoint a proxy, or if there is a concern that the proxy is not acting in your best interests, a court-appointed guardian may be designated to act on your behalf. Living will A living will is a set of instructions documenting your wishes about medical care when you cannot express them yourself. Health care providers should keep a copy of your living will in your medical record. You may want to give a copy to family members or friends. To alert caregivers in case of an emergency, you can place a card in your wallet to let them know that you have a living will and where they can find it. A living will is used if you become:  Terminally ill.  Incapacitated.  Unable to communicate or make decisions. Items to consider in your living will include:  The use or non-use of life-sustaining equipment, such as dialysis machines and breathing machines (ventilators).  A DNR or DNAR order, which is the instruction not to use cardiopulmonary resuscitation (CPR) if breathing or heartbeat stops.  The use or non-use of tube feeding.  Withholding of food and fluids.  Comfort (palliative) care when the goal becomes comfort rather than a cure.  Organ and tissue  donation. A living will does not give instructions for distributing your money and property if you should pass away. It is recommended that you seek the advice of a lawyer when writing a will. Decisions about taxes, beneficiaries, and asset distribution will be legally binding. This process can relieve your family and friends of any concerns surrounding disputes or questions that may come up about the distribution of your assets. DNR or DNAR A DNR or DNAR order is a request not to have CPR in the event that your heart stops beating or you stop breathing. If a DNR or DNAR order has not been made and shared, a health care provider will try to help  any patient whose heart has stopped or who has stopped breathing. If you plan to have surgery, talk with your health care provider about how your DNR or DNAR order will be followed if problems occur. Summary  Advance directives are the legal documents that allow you to make choices ahead of time about your health care and medical treatment in case you become unable to communicate for yourself.  The process of discussing and writing advance directives should happen over time. You can change the advance directives, even after you have signed them.  Advance directives include DNR or DNAR orders, living wills, and designating an agent as your medical power of attorney. This information is not intended to replace advice given to you by your health care provider. Make sure you discuss any questions you have with your health care provider. Document Released: 07/17/2007 Document Revised: 02/27/2016 Document Reviewed: 02/27/2016 Elsevier Interactive Patient Education  2019 [employer]

## 2018-09-02 ENCOUNTER — Ambulatory Visit (INDEPENDENT_AMBULATORY_CARE_PROVIDER_SITE_OTHER): Payer: Medicare Other | Admitting: *Deleted

## 2018-09-02 DIAGNOSIS — Z23 Encounter for immunization: Secondary | ICD-10-CM | POA: Diagnosis not present

## 2018-09-02 NOTE — Progress Notes (Signed)
Pt given Prevnar 13 vaccine Tolerated well

## 2018-09-04 ENCOUNTER — Other Ambulatory Visit: Payer: Self-pay | Admitting: Otolaryngology

## 2018-09-09 DIAGNOSIS — H903 Sensorineural hearing loss, bilateral: Secondary | ICD-10-CM | POA: Diagnosis not present

## 2018-09-23 ENCOUNTER — Telehealth: Payer: Self-pay

## 2018-09-23 NOTE — Telephone Encounter (Signed)
   [facility] Pre-operative Risk Assessment    Request for surgical clearance:  1. What type of surgery is being performed? Left Cochlear implant   2. When is this surgery scheduled? 09/30/18   3. What type of clearance is required (medical clearance vs. Pharmacy clearance to hold med vs. Both)? both  4. Are there any medications that need to be held prior to surgery and how long? Plavix, Aspirin  5. Practice name and name of physician performing surgery? [facility]- Dr.Teoh   6. What is your office phone number [phone]    7.   What is your office fax number [phone]  8.   Anesthesia type (None, local, MAC, general) ? unknown   Franklin [name] 09/23/2018, 4:09 PM  _________________________________________________________________   (provider comments below)

## 2018-09-23 NOTE — Progress Notes (Signed)
Spoke with Miranda at Dr Avel Sensor office and made her aware that we will need cardiac clearance and Plavix instructions for surgery by Dr Antoine Poche. Pt has hx MI and stents x2.

## 2018-09-24 ENCOUNTER — Encounter (HOSPITAL_BASED_OUTPATIENT_CLINIC_OR_DEPARTMENT_OTHER): Payer: Self-pay | Admitting: *Deleted

## 2018-09-24 ENCOUNTER — Other Ambulatory Visit: Payer: Self-pay

## 2018-09-24 NOTE — Telephone Encounter (Signed)
   Primary Cardiologist: Rollene Rotunda, MD  Chart reviewed as part of pre-operative protocol coverage. 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  Franklin [name] was last seen on 03/05/18 by Dr. Antoine Poche.  Since that day, Franklin [name] has done well. He has CAD with last event, MI and stent, in 2009. He is very active without any exertional symptoms.   Therefore, based on ACC/AHA guidelines, the patient would be at acceptable risk for the planned procedure without further cardiovascular testing.   I have am reaching out to Dr. Antoine Poche for guidance on holding aspirin and Plavix for the procedure.   Once I have all information, I will route this recommendation to the requesting party via Epic fax function and remove from pre-op pool.   Berton Bon, NP 09/24/2018, 10:15 AM

## 2018-09-25 NOTE — Telephone Encounter (Signed)
Follow up:   Patient calling concering his medical clearance concering stopping some medication. Please call patient back. Could it also be faxed to Dr.Tao

## 2018-09-26 ENCOUNTER — Other Ambulatory Visit: Payer: Self-pay

## 2018-09-26 ENCOUNTER — Encounter (HOSPITAL_BASED_OUTPATIENT_CLINIC_OR_DEPARTMENT_OTHER)
Admission: RE | Admit: 2018-09-26 | Discharge: 2018-09-26 | Disposition: A | Discharge status: Home / Self Care | Payer: Medicare Other | Source: Ambulatory Visit | Attending: Otolaryngology | Admitting: Otolaryngology

## 2018-09-26 ENCOUNTER — Other Ambulatory Visit (HOSPITAL_COMMUNITY)
Admission: RE | Admit: 2018-09-26 | Discharge: 2018-09-26 | Disposition: A | Discharge status: Home / Self Care | Payer: Medicare Other | Source: Ambulatory Visit | Attending: Otolaryngology | Admitting: Otolaryngology

## 2018-09-26 DIAGNOSIS — Z01812 Encounter for preprocedural laboratory examination: Secondary | ICD-10-CM | POA: Insufficient documentation

## 2018-09-26 DIAGNOSIS — E119 Type 2 diabetes mellitus without complications: Secondary | ICD-10-CM | POA: Diagnosis not present

## 2018-09-26 DIAGNOSIS — F17210 Nicotine dependence, cigarettes, uncomplicated: Secondary | ICD-10-CM | POA: Diagnosis not present

## 2018-09-26 DIAGNOSIS — I252 Old myocardial infarction: Secondary | ICD-10-CM | POA: Diagnosis not present

## 2018-09-26 DIAGNOSIS — Z1159 Encounter for screening for other viral diseases: Secondary | ICD-10-CM | POA: Insufficient documentation

## 2018-09-26 DIAGNOSIS — Z955 Presence of coronary angioplasty implant and graft: Secondary | ICD-10-CM | POA: Diagnosis not present

## 2018-09-26 DIAGNOSIS — H903 Sensorineural hearing loss, bilateral: Secondary | ICD-10-CM | POA: Diagnosis not present

## 2018-09-26 DIAGNOSIS — I2510 Atherosclerotic heart disease of native coronary artery without angina pectoris: Secondary | ICD-10-CM | POA: Diagnosis not present

## 2018-09-26 LAB — BASIC METABOLIC PANEL
Anion gap: 10 (ref 5–15)
BUN: 19 mg/dL (ref 8–23)
CO2: 25 mmol/L (ref 22–32)
Calcium: 9.8 mg/dL (ref 8.9–10.3)
Chloride: 102 mmol/L (ref 98–111)
Creatinine, Ser: 1.4 mg/dL — ABNORMAL HIGH (ref 0.61–1.24)
GFR calc Af Amer: 59 mL/min — ABNORMAL LOW (ref >60)
GFR calc non Af Amer: 51 mL/min — ABNORMAL LOW (ref >60)
Glucose, Bld: 104 mg/dL — ABNORMAL HIGH (ref 70–99)
Potassium: 5.3 mmol/L — ABNORMAL HIGH (ref 3.5–5.1)
Sodium: 137 mmol/L (ref 135–145)

## 2018-09-26 NOTE — Telephone Encounter (Signed)
   Primary Cardiologist: Rollene Rotunda, MD  Chart reviewed as part of pre-operative protocol coverage. Given past medical history and time since last visit, based on ACC/AHA guidelines, ILYASS SLAPPY would be at acceptable risk for the planned procedure without further cardiovascular testing.   OK to hold Plavix 5 -7 days pre op, aspirin 3 days pre op, resume both ASAP post op.  I will route this recommendation to the requesting party via Epic fax function and remove from pre-op pool.  Please call with questions.  Corine Shelter, PA-C 09/26/2018, 8:02 AM

## 2018-09-27 LAB — NOVEL CORONAVIRUS, NAA (HOSP ORDER, SEND-OUT TO REF LAB; TAT 18-24 HRS): SARS-CoV-2, NAA: NOT DETECTED

## 2018-09-29 ENCOUNTER — Telehealth: Payer: Self-pay

## 2018-09-29 NOTE — Telephone Encounter (Signed)
   [facility] Pre-operative Risk Assessment    Request for surgical clearance:  1. What type of surgery is being performed? Left Cochlear Implant  2. When is this surgery scheduled? 09/30/18  3. What type of clearance is required (medical clearance vs. Pharmacy clearance to hold med vs. Both)? Both  4. Are there any medications that need to be held prior to surgery and how long? Plavix and Aspirin  5. Practice name and name of physician performing surgery? [facility]- Dr. Benjamine Mola  6. What is your office phone number? [phone]   7.   What is your office fax number? [phone]  8.   Anesthesia type (None, local, MAC, general) ? Not Listed   Rhetta Mura 09/29/2018, 2:29 PM  _________________________________________________________________   (provider comments below)

## 2018-09-29 NOTE — Telephone Encounter (Signed)
  Clearance has already been addressed. Pt was cleared on 09/26/18 for procedure and given clearance to hold antiplatelets.   Per previous clearance, OK to hold Plavix 5 -7 days pre op, aspirin 3 days pre op, resume both ASAP post op.  I will resend clearance to requesting provider and will remove from preop pool.    Brittainy [employer]Max

## 2018-09-29 NOTE — Progress Notes (Signed)
K+ 5.3, notified Dr.Miller, will proceed with surgery as scheduled.

## 2018-09-30 ENCOUNTER — Ambulatory Visit (HOSPITAL_BASED_OUTPATIENT_CLINIC_OR_DEPARTMENT_OTHER): Payer: Medicare Other | Admitting: Anesthesiology

## 2018-09-30 ENCOUNTER — Encounter (HOSPITAL_BASED_OUTPATIENT_CLINIC_OR_DEPARTMENT_OTHER): Payer: Self-pay | Admitting: *Deleted

## 2018-09-30 ENCOUNTER — Ambulatory Visit (HOSPITAL_COMMUNITY): Payer: Medicare Other

## 2018-09-30 ENCOUNTER — Encounter (HOSPITAL_BASED_OUTPATIENT_CLINIC_OR_DEPARTMENT_OTHER)
Admission: RE | Disposition: A | Discharge status: Home / Self Care | Payer: Self-pay | Source: Home / Self Care | Attending: Otolaryngology

## 2018-09-30 ENCOUNTER — Ambulatory Visit (HOSPITAL_BASED_OUTPATIENT_CLINIC_OR_DEPARTMENT_OTHER)
Admission: RE | Admit: 2018-09-30 | Discharge: 2018-09-30 | Disposition: A | Discharge status: Home / Self Care | Payer: Medicare Other | Attending: Otolaryngology | Admitting: Otolaryngology

## 2018-09-30 ENCOUNTER — Other Ambulatory Visit: Payer: Self-pay

## 2018-09-30 DIAGNOSIS — I252 Old myocardial infarction: Secondary | ICD-10-CM | POA: Insufficient documentation

## 2018-09-30 DIAGNOSIS — H903 Sensorineural hearing loss, bilateral: Secondary | ICD-10-CM | POA: Diagnosis not present

## 2018-09-30 DIAGNOSIS — F17210 Nicotine dependence, cigarettes, uncomplicated: Secondary | ICD-10-CM | POA: Insufficient documentation

## 2018-09-30 DIAGNOSIS — I2510 Atherosclerotic heart disease of native coronary artery without angina pectoris: Secondary | ICD-10-CM | POA: Insufficient documentation

## 2018-09-30 DIAGNOSIS — Z9621 Cochlear implant status: Secondary | ICD-10-CM | POA: Diagnosis not present

## 2018-09-30 DIAGNOSIS — E119 Type 2 diabetes mellitus without complications: Secondary | ICD-10-CM | POA: Insufficient documentation

## 2018-09-30 DIAGNOSIS — Z419 Encounter for procedure for purposes other than remedying health state, unspecified: Secondary | ICD-10-CM

## 2018-09-30 DIAGNOSIS — H9193 Unspecified hearing loss, bilateral: Secondary | ICD-10-CM | POA: Diagnosis not present

## 2018-09-30 DIAGNOSIS — Z955 Presence of coronary angioplasty implant and graft: Secondary | ICD-10-CM | POA: Insufficient documentation

## 2018-09-30 HISTORY — PX: COCHLEAR IMPLANT: SHX184

## 2018-09-30 HISTORY — DX: Chronic obstructive pulmonary disease, unspecified: J44.9

## 2018-09-30 LAB — GLUCOSE, CAPILLARY
Glucose-Capillary: 118 mg/dL — ABNORMAL HIGH (ref 70–99)
Glucose-Capillary: 118 mg/dL — ABNORMAL HIGH (ref 70–99)

## 2018-09-30 SURGERY — INSERTION, IMPLANT, COCHLEAR
Anesthesia: General | Site: Ear | Laterality: Left

## 2018-09-30 MED ORDER — SCOPOLAMINE 1 MG/3DAYS TD PT72: 1.0000 | MEDICATED_PATCH | Freq: Once | TRANSDERMAL | Status: DC | PRN

## 2018-09-30 MED ORDER — PHENYLEPHRINE HCL (PRESSORS) 10 MG/ML IV SOLN
INTRAVENOUS | Status: DC | PRN
  Administered 2018-09-30: 80 ug via INTRAVENOUS

## 2018-09-30 MED ORDER — OXYCODONE-ACETAMINOPHEN 5-325 MG PO TABS: 1.0000 | ORAL_TABLET | ORAL | 0 refills | Status: DC | PRN

## 2018-09-30 MED ORDER — SODIUM CHLORIDE 0.9 % IV SOLN
INTRAVENOUS | Status: DC | PRN
  Administered 2018-09-30: 50 ug/min via INTRAVENOUS

## 2018-09-30 MED ORDER — DEXAMETHASONE SODIUM PHOSPHATE 4 MG/ML IJ SOLN
INTRAMUSCULAR | Status: DC | PRN
  Administered 2018-09-30: 5 mg via INTRAVENOUS

## 2018-09-30 MED ORDER — LIDOCAINE 2% (20 MG/ML) 5 ML SYRINGE
INTRAMUSCULAR | Status: AC
  Filled 2018-09-30: qty 5

## 2018-09-30 MED ORDER — EPHEDRINE SULFATE 50 MG/ML IJ SOLN
INTRAMUSCULAR | Status: DC | PRN
  Administered 2018-09-30: 10 mg via INTRAVENOUS

## 2018-09-30 MED ORDER — CEFAZOLIN SODIUM-DEXTROSE 2-3 GM-%(50ML) IV SOLR
INTRAVENOUS | Status: DC | PRN
  Administered 2018-09-30: 2 g via INTRAVENOUS

## 2018-09-30 MED ORDER — LIDOCAINE-EPINEPHRINE 1 %-1:100000 IJ SOLN
INTRAMUSCULAR | Status: DC | PRN
  Administered 2018-09-30: 1.5 mL

## 2018-09-30 MED ORDER — FENTANYL CITRATE (PF) 100 MCG/2ML IJ SOLN: 25.0000 ug | INTRAMUSCULAR | Status: DC | PRN

## 2018-09-30 MED ORDER — FENTANYL CITRATE (PF) 100 MCG/2ML IJ SOLN
INTRAMUSCULAR | Status: AC
  Filled 2018-09-30: qty 2

## 2018-09-30 MED ORDER — FENTANYL CITRATE (PF) 100 MCG/2ML IJ SOLN
50.0000 ug | INTRAMUSCULAR | Status: DC | PRN
  Administered 2018-09-30: 50 ug via INTRAVENOUS
  Administered 2018-09-30: 100 ug via INTRAVENOUS

## 2018-09-30 MED ORDER — ONDANSETRON HCL 4 MG/2ML IJ SOLN
INTRAMUSCULAR | Status: DC | PRN
  Administered 2018-09-30: 4 mg via INTRAVENOUS

## 2018-09-30 MED ORDER — OXYCODONE HCL 5 MG/5ML PO SOLN: 5.0000 mg | Freq: Once | ORAL | Status: DC | PRN

## 2018-09-30 MED ORDER — PROPOFOL 10 MG/ML IV BOLUS
INTRAVENOUS | Status: AC
  Filled 2018-09-30: qty 20

## 2018-09-30 MED ORDER — CIPROFLOXACIN-DEXAMETHASONE 0.3-0.1 % OT SUSP: OTIC | Status: DC | PRN

## 2018-09-30 MED ORDER — MIDAZOLAM HCL 2 MG/2ML IJ SOLN
1.0000 mg | INTRAMUSCULAR | Status: DC | PRN
  Administered 2018-09-30: 2 mg via INTRAVENOUS

## 2018-09-30 MED ORDER — CEFAZOLIN SODIUM-DEXTROSE 2-4 GM/100ML-% IV SOLN
INTRAVENOUS | Status: AC
  Filled 2018-09-30: qty 100

## 2018-09-30 MED ORDER — ONDANSETRON HCL 4 MG/2ML IJ SOLN: 4.0000 mg | Freq: Once | INTRAMUSCULAR | Status: DC | PRN

## 2018-09-30 MED ORDER — PROPOFOL 10 MG/ML IV BOLUS
INTRAVENOUS | Status: DC | PRN
  Administered 2018-09-30: 20 mg via INTRAVENOUS
  Administered 2018-09-30: 150 mg via INTRAVENOUS

## 2018-09-30 MED ORDER — LIDOCAINE 2% (20 MG/ML) 5 ML SYRINGE
INTRAMUSCULAR | Status: DC | PRN
  Administered 2018-09-30: 80 mg via INTRAVENOUS

## 2018-09-30 MED ORDER — LACTATED RINGERS IV SOLN
INTRAVENOUS | Status: DC
  Administered 2018-09-30 (×2): via INTRAVENOUS

## 2018-09-30 MED ORDER — SUCCINYLCHOLINE CHLORIDE 200 MG/10ML IV SOSY
PREFILLED_SYRINGE | INTRAVENOUS | Status: AC
  Filled 2018-09-30: qty 10

## 2018-09-30 MED ORDER — AMOXICILLIN 875 MG PO TABS: 875.0000 mg | ORAL_TABLET | Freq: Two times a day (BID) | ORAL | 0 refills | Status: DC

## 2018-09-30 MED ORDER — OXYCODONE-ACETAMINOPHEN 5-325 MG PO TABS: 1.0000 | ORAL_TABLET | ORAL | 0 refills | Status: AC | PRN

## 2018-09-30 MED ORDER — OXYCODONE HCL 5 MG PO TABS: 5.0000 mg | ORAL_TABLET | Freq: Once | ORAL | Status: DC | PRN

## 2018-09-30 MED ORDER — DEXAMETHASONE SODIUM PHOSPHATE 10 MG/ML IJ SOLN
INTRAMUSCULAR | Status: AC
  Filled 2018-09-30: qty 1

## 2018-09-30 MED ORDER — PROPOFOL 500 MG/50ML IV EMUL
INTRAVENOUS | Status: DC | PRN
  Administered 2018-09-30: 15 ug/kg/min via INTRAVENOUS

## 2018-09-30 MED ORDER — SUCCINYLCHOLINE CHLORIDE 20 MG/ML IJ SOLN
INTRAMUSCULAR | Status: DC | PRN
  Administered 2018-09-30: 80 mg via INTRAVENOUS

## 2018-09-30 MED ORDER — AMOXICILLIN 875 MG PO TABS: 875.0000 mg | ORAL_TABLET | Freq: Two times a day (BID) | ORAL | 0 refills | Status: AC

## 2018-09-30 MED ORDER — MIDAZOLAM HCL 2 MG/2ML IJ SOLN
INTRAMUSCULAR | Status: AC
  Filled 2018-09-30: qty 2

## 2018-09-30 MED ORDER — ONDANSETRON HCL 4 MG/2ML IJ SOLN
INTRAMUSCULAR | Status: AC
  Filled 2018-09-30: qty 2

## 2018-09-30 SURGICAL SUPPLY — 70 items
ADH SKN CLS APL DERMABOND .7 (GAUZE/BANDAGES/DRESSINGS) ×1
BALL CTTN LRG ABS STRL LF (GAUZE/BANDAGES/DRESSINGS) ×1
BLADE CLIPPER SURG (BLADE) ×2 IMPLANT
BLADE SURG 10 STRL SS (BLADE) IMPLANT
BUR DIAMOND COARSE 3.0 (BURR) ×2 IMPLANT
BUR RND OSTEON ELITE 6.0 (BURR) ×1 IMPLANT
BUR RND OSTEON ELITE 6.0MM (BURR) ×1
BUR SABER TAPERED DIAMOND 1 (BURR) ×2 IMPLANT
BUR STRYKER TAPERED RND 1.5 (BURR) ×1 IMPLANT
BUR STRYKER TAPERED RND 1.5MM (BURR) ×1
CANISTER SUCT 1200ML W/VALVE (MISCELLANEOUS) ×3 IMPLANT
CORD BIPOLAR FORCEPS 12FT (ELECTRODE) ×3 IMPLANT
COTTONBALL LRG STERILE PKG (GAUZE/BANDAGES/DRESSINGS) ×3 IMPLANT
COVER WAND RF STERILE (DRAPES) IMPLANT
DECANTER SPIKE VIAL GLASS SM (MISCELLANEOUS) ×3 IMPLANT
DERMABOND ADVANCED (GAUZE/BANDAGES/DRESSINGS) ×2
DERMABOND ADVANCED .7 DNX12 (GAUZE/BANDAGES/DRESSINGS) ×1 IMPLANT
DRAPE C-ARM 42X72 X-RAY (DRAPES) ×3 IMPLANT
DRAPE INCISE 23X17 IOBAN STRL (DRAPES) ×2
DRAPE INCISE 23X17 STRL (DRAPES) IMPLANT
DRAPE INCISE IOBAN 23X17 STRL (DRAPES) ×1 IMPLANT
DRAPE MICROSCOPE WILD 40.5X102 (DRAPES) ×3 IMPLANT
DRAPE SURG 17X23 STRL (DRAPES) ×3 IMPLANT
DRAPE SURG IRRIG POUCH 19X23 (DRAPES) ×3 IMPLANT
DRSG GLASSCOCK MASTOID ADT (GAUZE/BANDAGES/DRESSINGS) ×2 IMPLANT
ELECT COATED BLADE 2.86 ST (ELECTRODE) ×3 IMPLANT
ELECT PAIRED SUBDERMAL (MISCELLANEOUS) ×3
ELECT REM PT RETURN 9FT ADLT (ELECTROSURGICAL) ×3
ELECTRODE PAIRED SUBDERMAL (MISCELLANEOUS) ×1 IMPLANT
ELECTRODE REM PT RTRN 9FT ADLT (ELECTROSURGICAL) ×1 IMPLANT
GAUZE 4X4 16PLY RFD (DISPOSABLE) ×1 IMPLANT
GAUZE SPONGE 4X4 12PLY STRL (GAUZE/BANDAGES/DRESSINGS) IMPLANT
GAUZE SPONGE 4X4 12PLY STRL LF (GAUZE/BANDAGES/DRESSINGS) ×3 IMPLANT
GLOVE BIO SURGEON STRL SZ7.5 (GLOVE) ×3 IMPLANT
GLOVE BIOGEL PI IND STRL 7.0 (GLOVE) IMPLANT
GLOVE BIOGEL PI INDICATOR 7.0 (GLOVE) ×4
GLOVE ECLIPSE 6.5 STRL STRAW (GLOVE) ×4 IMPLANT
GOWN STRL REUS W/ TWL LRG LVL3 (GOWN DISPOSABLE) ×2 IMPLANT
GOWN STRL REUS W/TWL LRG LVL3 (GOWN DISPOSABLE) ×9
HEMOSTAT SURGICEL .5X2 ABSORB (HEMOSTASIS) IMPLANT
HEMOSTAT SURGICEL 2X14 (HEMOSTASIS) IMPLANT
IMPL COCHLEAR ULTRA 3D (Prosthesis and Implant ENT) IMPLANT
IMPLANT COCHLEAR ULTRA 3D (Prosthesis and Implant ENT) ×3 IMPLANT
IV NS 500ML (IV SOLUTION) ×3
IV NS 500ML BAXH (IV SOLUTION) ×1 IMPLANT
IV SET EXT 30 76VOL 4 MALE LL (IV SETS) ×2 IMPLANT
NDL HYPO 25X1 1.5 SAFETY (NEEDLE) ×1 IMPLANT
NDL SAFETY ECLIPSE 18X1.5 (NEEDLE) ×1 IMPLANT
NEEDLE HYPO 18GX1.5 SHARP (NEEDLE)
NEEDLE HYPO 25X1 1.5 SAFETY (NEEDLE) ×3 IMPLANT
NS IRRIG 1000ML POUR BTL (IV SOLUTION) ×3 IMPLANT
PACK BASIN DAY SURGERY FS (CUSTOM PROCEDURE TRAY) ×3 IMPLANT
PACK ENT DAY SURGERY (CUSTOM PROCEDURE TRAY) ×3 IMPLANT
PAD ALCOHOL SWAB (MISCELLANEOUS) ×6 IMPLANT
PENCIL BUTTON HOLSTER BLD 10FT (ELECTRODE) ×3 IMPLANT
PROBE NERVBE PRASS .33 (MISCELLANEOUS) IMPLANT
PROCESSOR SINGL CI M9 3D NAIDA (MISCELLANEOUS) ×2 IMPLANT
SLEEVE SCD COMPRESS KNEE MED (MISCELLANEOUS) ×2 IMPLANT
SPONGE SURGIFOAM ABS GEL 12-7 (HEMOSTASIS) ×3 IMPLANT
SUT BONE WAX W31G (SUTURE) IMPLANT
SUT SILK 2 0 PERMA HAND 18 BK (SUTURE) IMPLANT
SUT VIC AB 3-0 FS2 27 (SUTURE) IMPLANT
SUT VICRYL 4-0 PS2 18IN ABS (SUTURE) ×3 IMPLANT
SYR BULB 3OZ (MISCELLANEOUS) ×3 IMPLANT
TOWEL GREEN STERILE FF (TOWEL DISPOSABLE) ×3 IMPLANT
TRAY DSU PREP LF (CUSTOM PROCEDURE TRAY) ×3 IMPLANT
TRAY FOL W/BAG SLVR 16FR STRL (SET/KITS/TRAYS/PACK) IMPLANT
TRAY FOLEY W/BAG SLVR 14FR LF (SET/KITS/TRAYS/PACK) IMPLANT
TRAY FOLEY W/BAG SLVR 16FR LF (SET/KITS/TRAYS/PACK)
TUBING IRRIGATION (MISCELLANEOUS) ×3 IMPLANT

## 2018-09-30 NOTE — Discharge Instructions (Addendum)
POSTOPERATIVE INSTRUCTIONS FOR PATIENTS HAVING MASTOIDECTOMY SURGERY  1. You may have nausea, vomiting, or a low grade fever for a few days after surgery. This is not unusual.  However, if the nausea and vomiting become severe or last more than one day, please call our office. Medication for nausea may be prescribed. You may take Tylenol every four hours for fever. If your fever should rise above 101 F, please contact our office. 2. Limit your activities for one week. This includes avoiding heavy lifting (over 20lbs), vigorous exercise, and contact sports. 3. Do not blow your nose for approximately one week.  Any accumulation in the nose should be drawn back into the throat and expectorated through the mouth to avoid infecting the ear. If it is necessary to sneeze, do so with your mouth open to decrease pressure to your ears. Do not hold your nose to avoid sneezing.  4. Try to keep the incision clean and dry. Any time you are going to clean your ear, please wash your hands thoroughly prior to starting. 5. Some dull postoperative ear pain is expected. Your physician may prescribe pain medicine to help relieve your discomfort. If your postoperative pain increases and your medication is not helping, please call the office before taking any other medication that we have not prescribed or recommended. 6. If any of the following should occur, contact Dr.Teoh:   (Office: [phone])) a. Persistent bleeding                                                             b. Persistent fever c. Purulent drainage (pus) from the ear or incision d. Increasing redness around the suture line e. Persistent pain or dizziness f. Facial weakness 7. Sometimes, with a larger incision behind the ear, the incision may open and drain. If it occurs, please contact our office. 8. If your physician prescribes an antibiotic, fill the prescription promptly and take all of the medicine as directed until the entire supply is  gone. 9.  You may experience some popping and cracking sounds in the ear for up to several weeks. It may sound like you are talking in a barrel or a tunnel. This is normal and should not cause concern. 10. Because a nerve for taste passes through your ear, it is not unusual for your taste sensation to be altered for several weeks or months. 11. You may experience some numbness in your outer ear, earlobe, and the incision area. This is normal, and most of the numbness will be expected to fade over a period of time.                                                               12. Your eardrum may look pink or red for up to a month postoperatively. The red coloration is due to fluid in the middle ear. The change in color should not be confused with infection.  13. It is important to return to our office for your postoperative appointment as scheduled. If for some reason you were not given a postoperative  appointment, please call our office at [phone].    Post Anesthesia Home Care Instructions  Activity: Get plenty of rest for the remainder of the day. A responsible individual must stay with you for 24 hours following the procedure.  For the next 24 hours, DO NOT: -Drive a car -Advertising copywriterperate machinery -Drink alcoholic beverages -Take any medication unless instructed by your physician -Make any legal decisions or sign important papers.  Meals: Start with liquid foods such as gelatin or soup. Progress to regular foods as tolerated. Avoid greasy, spicy, heavy foods. If nausea and/or vomiting occur, drink only clear liquids until the nausea and/or vomiting subsides. Call your physician if vomiting continues.  Special Instructions/Symptoms: Your throat may feel dry or sore from the anesthesia or the breathing tube placed in your throat during surgery. If this causes discomfort, gargle with warm salt water. The discomfort should disappear within 24 hours.  If you had a scopolamine patch placed  behind your ear for the management of post- operative nausea and/or vomiting:  1. The medication in the patch is effective for 72 hours, after which it should be removed.  Wrap patch in a tissue and discard in the trash. Wash hands thoroughly with soap and water. 2. You may remove the patch earlier than 72 hours if you experience unpleasant side effects which may include dry mouth, dizziness or visual disturbances. 3. Avoid touching the patch. Wash your hands with soap and water after contact with the patch.

## 2018-09-30 NOTE — Transfer of Care (Signed)
Immediate Anesthesia Transfer of Care Note  Patient: Franklin [name]  Procedure(s) Performed: COCHLEAR IMPLANT (Left Ear)  Patient Location: PACU  Anesthesia Type:General  Level of Consciousness: sedated  Airway & Oxygen Therapy: Patient Spontanous Breathing and Patient connected to face mask oxygen  Post-op Assessment: Report given to RN and Post -op Vital signs reviewed and stable  Post vital signs: Reviewed and stable  Last Vitals:  Vitals Value Taken Time  BP 120/59 09/30/2018 11:36 AM  Temp    Pulse 66 09/30/2018 11:38 AM  Resp 19 09/30/2018 11:38 AM  SpO2 100 % 09/30/2018 11:38 AM  Vitals shown include unvalidated device data.  Last Pain:  Vitals:   09/30/18 0809  TempSrc: Oral  PainSc: 0-No pain         Complications: No apparent anesthesia complications

## 2018-09-30 NOTE — H&P (Signed)
Cc: Bilateral profound hearing loss  HPI: The patient is a 68 y/o male who presents today for evaluation of progressive hearing loss. The patient noted onset of hearing loss at the age of 41. He was seen by Dr. Janace Hoard several years ago who recommended cochlear implants. At that time, the patient was concerned with the cost and never proceeded. He continues to have significant difficulty with his hearing. No otalgia, otorrhea, or vertigo is noted. The patient has bilateral tinnitus and intermittent aural pressure. Previous otologic surgery is denied.   The patient's review of systems (constitutional, eyes, ENT, cardiovascular, respiratory, GI, musculoskeletal, skin, neurologic, psychiatric, endocrine, hematologic, allergic) is noted in the ROS questionnaire.  It is reviewed with the patient and his wife.   Family health history: Hearing loss.  Major events: Appendectomy, hernia repair, cardiac stents.  Ongoing medical problems: Diabetes, hearing loss,.  Social history: The patient is married. He smokes one pack of cigarettes a day. He denies the use of  alcohol or illegal drugs.   Exam General: Communicates without difficulty, well nourished, no acute distress. Head: Normocephalic, no evidence injury, no tenderness, facial buttresses intact without stepoff. Eyes: PERRL, EOMI. No scleral icterus, conjunctivae clear. Neuro: CN II exam reveals vision grossly intact.  No nystagmus at any point of gaze. Ears: Auricles well formed without lesions.  Ear canals are intact without mass or lesion.  No erythema or edema is appreciated.  The TMs are intact without fluid. Nose: External evaluation reveals normal support and skin without lesions.  Dorsum is intact.  Anterior rhinoscopy reveals healthy pink mucosa over anterior aspect of inferior turbinates and intact septum.  No purulence noted. Oral:  Oral cavity and oropharynx are intact, symmetric, without erythema or edema.  Mucosa is moist without lesions. Neck:  Full range of motion without pain.  There is no significant lymphadenopathy.  No masses palpable.  Thyroid bed within normal limits to palpation.  Parotid glands and submandibular glands equal bilaterally without mass.  Trachea is midline. Neuro:  CN 2-12 grossly intact. Gait normal. Vestibular: No nystagmus at any point of gaze. The cerebellar examination is unremarkable.    AUDIOMETRIC TESTING: I have read and reviewed the audiometric test, which shows bilateral sensorineural hearing loss, profound on the left across all frequencies. The speech reception threshold is 90dB AD and 90 dB AS, with 0% (no response) to discrimination testing.  Assessment 1. The patient's ear canals, tympanic membranes and middle ear spaces are all normal.  2. 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  Plan  1. The physical exam and hearing test findings are reviewed with the patient.  2. The patient is a candidate for cochlear implants. The risks, benefits, alternatives, and details of the procedure are reviewed with the patient. Questions are invited and answered. 3. The patient would like to proceed with the procedure.

## 2018-09-30 NOTE — Op Note (Signed)
DATE OF PROCEDURE: 09/30/2018  SURGEON: Leta Baptist, MD  OPERATIVE REPORT   PREOPERATIVE DIAGNOSIS:  1.  Bilateral profound hearing loss.  POSTOPERATIVE DIAGNOSIS:  1.  Bilateral profound hearing loss.  PROCEDURES PERFORMED: 1. Left ear cochlear implantation  ANESTHESIA: General endotracheal tube anesthesia.  COMPLICATIONS: None.  ESTIMATED BLOOD LOSS: 119ml  INDICATION FOR PROCEDURE:   Franklin [name] is a 68 y.o. male with a history of bilateral progressive hearing loss.  The patient noted onset of hearing loss at the age of 33.  His hearing has progressively worsened.  Over the past few years, he has been experiencing significant difficulty with his hearing and communication.  Due to the severity of his hearing loss, he was not able to benefit from the use of his hearing aids. Based on the above findings, the decision was made for patient to undergo the above-stated procedure. The risks, benefits, alternatives, and details of the procedures were discussed with the patient. Questions were invited and answered. Informed consent was obtained.  DESCRIPTION OF PROCEDURE: The patient was taken to the operating room and placed supine on the operating table. General endotracheal tube anesthesia was induced by the anesthesiologist.The patient was positioned and prepped and draped in a standard fashion for left ear surgery. Facial nerve monitoring electrodes were placed. The facial nerve monitoring system was functional throughout the case.  1% lidocaine with 1-100,000 epinephrine was infiltrated into the left postauricular crease. A standard postauricular incision was made. The soft tissue covering the mastoid cortex was carefully elevated. Using a #6 cutting bur, a standard cortical mastoidectomy procedure was performed. The tegmen and sigmoid sinus were identified and preserved. The posterior canal wall was taken down to the level of the facial nerve. The antrum was entered.  The  facial recess was then opened with a 1.5 diamond bur.  The chorda tympani was identified and removed.  A 1 mm cochleostomy opening was made.  Attention was then focused on creating the periosteal pocket.  The bone bed was drilled to the depth of the recess gauge. A HiRes Ultra 3D cochlear implant was placed in the periosteal pocket.  The electrodes were inserted through the cochleostomy into the cochlea without difficulty.  Full insertion was achieved.  Postop x-ray confirmed the placement of the electrodes.  The postauricular incision was then closed in layers with 4-0 Vicryl and Dermabond. A Glasscock dressing was applied. That concluded the procedure for the patient. The care of the patient was turned over to the anesthesiologist. The patient was awakened from anesthesia without difficulty. He was extubated and transferred to the recovery room in good condition.  OPERATIVE FINDINGS:An [employer]  HiRes Ultra 3D cochlear implant was placed without difficulty.  SPECIMEN:None  FOLLOWUP CARE: The patient will be discharged home once he is awake and alert. He will follow up in my office in 1 week.  [name], MD

## 2018-09-30 NOTE — Anesthesia Procedure Notes (Signed)
Procedure Name: Intubation Date/Time: 09/30/2018 9:38 AM Performed by: Maryella Shivers, CRNA Pre-anesthesia Checklist: Patient identified, Emergency Drugs available, Suction available and Patient being monitored Patient Re-evaluated:Patient Re-evaluated prior to induction Oxygen Delivery Method: Circle system utilized Preoxygenation: Pre-oxygenation with 100% oxygen Induction Type: IV induction Laryngoscope Size: Mac and 4 Grade View: Grade I Tube type: Oral Tube size: 8.0 mm Number of attempts: 1 Airway Equipment and Method: Stylet and Oral airway Placement Confirmation: ETT inserted through vocal cords under direct vision,  positive ETCO2 and breath sounds checked- equal and bilateral Secured at: 23 cm Tube secured with: Tape Dental Injury: Teeth and Oropharynx as per pre-operative assessment

## 2018-09-30 NOTE — Anesthesia Preprocedure Evaluation (Addendum)
Anesthesia Evaluation  Patient identified by MRN, date of birth, ID band Patient awake    Reviewed: Allergy & Precautions, NPO status , Patient's Chart, lab work & pertinent test results  History of Anesthesia Complications Negative for: history of anesthetic complications  Airway Mallampati: III  TM Distance: >3 FB Neck ROM: Full    Dental  (+) Edentulous Upper, Edentulous Lower   Pulmonary COPD, Current Smoker,    Pulmonary exam normal        Cardiovascular + CAD, + Past MI and + Cardiac Stents  Normal cardiovascular exam     Neuro/Psych negative neurological ROS  negative psych ROS   GI/Hepatic negative GI ROS, Neg liver ROS,   Endo/Other  diabetes, Type 2, Oral Hypoglycemic Agents  Renal/GU negative Renal ROS  negative genitourinary   Musculoskeletal negative musculoskeletal ROS (+)   Abdominal   Peds  Hematology negative hematology ROS (+)   Anesthesia Other Findings From Daune Perch, NP (Cardiology) on 09/24/18: "[name] was last seen on 03/05/18 by Dr. Percival Spanish.  Since that day, ADONI GREENOUGH has done well. He has CAD with last event, MI and stent, in 2009. He is very active without any exertional symptoms. Therefore, based on ACC/AHA guidelines, the patient would be at acceptable risk for the planned procedure without further cardiovascular testing."  Reproductive/Obstetrics                           Anesthesia Physical Anesthesia Plan  ASA: III  Anesthesia Plan: General   Post-op Pain Management:    Induction: Intravenous and Rapid sequence  PONV Risk Score and Plan: 1 and Ondansetron, Dexamethasone, Midazolam and Treatment may vary due to age or medical condition  Airway Management Planned: Oral ETT  Additional Equipment: None  Intra-op Plan:   Post-operative Plan: Extubation in OR  Informed Consent: I have reviewed the patients History and Physical,  chart, labs and discussed the procedure including the risks, benefits and alternatives for the proposed anesthesia with the patient or authorized representative who has indicated his/her understanding and acceptance.     Dental advisory given  Plan Discussed with:   Anesthesia Plan Comments:        Anesthesia Quick Evaluation

## 2018-09-30 NOTE — Progress Notes (Signed)
Patient complaining of ringing in ear that "sounds like a siren next to my ear at full blast."  Dr. Benjamine Mola notified of this. Dr. Benjamine Mola said to tell patient that this should get better. No new orders. Proceed with discharge per Dr. Benjamine Mola.

## 2018-09-30 NOTE — Anesthesia Postprocedure Evaluation (Signed)
Anesthesia Post Note  Patient: Franklin [name]  Procedure(s) Performed: COCHLEAR IMPLANT (Left Ear)     Patient location during evaluation: PACU Anesthesia Type: General Level of consciousness: awake and alert Pain management: pain level controlled Vital Signs Assessment: post-procedure vital signs reviewed and stable Respiratory status: spontaneous breathing, nonlabored ventilation and respiratory function stable Cardiovascular status: blood pressure returned to baseline and stable Postop Assessment: no apparent nausea or vomiting Anesthetic complications: no    Last Vitals:  Vitals:   09/30/18 1215 09/30/18 1224  BP: (!) 148/67   Pulse: 80 80  Resp: 10 14  Temp:    SpO2: 95% 98%    Last Pain:  Vitals:   09/30/18 1215  TempSrc:   PainSc: 0-No pain                 Lidia Collum

## 2018-10-01 ENCOUNTER — Encounter (HOSPITAL_BASED_OUTPATIENT_CLINIC_OR_DEPARTMENT_OTHER): Payer: Self-pay | Admitting: Otolaryngology

## 2018-10-09 ENCOUNTER — Ambulatory Visit (INDEPENDENT_AMBULATORY_CARE_PROVIDER_SITE_OTHER): Payer: Medicare Other | Admitting: Otolaryngology

## 2018-10-29 DIAGNOSIS — H903 Sensorineural hearing loss, bilateral: Secondary | ICD-10-CM | POA: Diagnosis not present

## 2018-11-10 DIAGNOSIS — H903 Sensorineural hearing loss, bilateral: Secondary | ICD-10-CM | POA: Diagnosis not present

## 2018-11-18 ENCOUNTER — Ambulatory Visit: Payer: Medicare Other | Admitting: Family Medicine

## 2018-11-26 ENCOUNTER — Ambulatory Visit: Payer: Medicare Other | Admitting: Family Medicine

## 2018-11-26 DIAGNOSIS — H903 Sensorineural hearing loss, bilateral: Secondary | ICD-10-CM | POA: Diagnosis not present

## 2018-12-02 DIAGNOSIS — H903 Sensorineural hearing loss, bilateral: Secondary | ICD-10-CM | POA: Diagnosis not present

## 2018-12-25 ENCOUNTER — Other Ambulatory Visit: Payer: Self-pay | Admitting: Family Medicine

## 2019-01-13 ENCOUNTER — Encounter: Payer: Self-pay | Admitting: Family Medicine

## 2019-01-14 MED ORDER — METFORMIN HCL 500 MG PO TABS: 500.0000 mg | ORAL_TABLET | Freq: Two times a day (BID) | ORAL | 0 refills | Status: DC

## 2019-01-14 MED ORDER — CLOPIDOGREL BISULFATE 75 MG PO TABS: 75.0000 mg | ORAL_TABLET | Freq: Every day | ORAL | 0 refills | Status: DC

## 2019-01-15 ENCOUNTER — Other Ambulatory Visit: Payer: Self-pay | Admitting: Family Medicine

## 2019-01-23 ENCOUNTER — Other Ambulatory Visit: Payer: Self-pay

## 2019-01-26 ENCOUNTER — Ambulatory Visit (INDEPENDENT_AMBULATORY_CARE_PROVIDER_SITE_OTHER): Payer: Medicare Other | Admitting: Family Medicine

## 2019-01-26 ENCOUNTER — Encounter: Payer: Self-pay | Admitting: Family Medicine

## 2019-01-26 ENCOUNTER — Other Ambulatory Visit: Payer: Self-pay

## 2019-01-26 VITALS — BP 112/72 | HR 83 | Temp 98.0°F | Resp 16 | Ht 76.0 in | Wt 173.0 lb

## 2019-01-26 DIAGNOSIS — Z1159 Encounter for screening for other viral diseases: Secondary | ICD-10-CM | POA: Diagnosis not present

## 2019-01-26 DIAGNOSIS — Z9621 Cochlear implant status: Secondary | ICD-10-CM

## 2019-01-26 DIAGNOSIS — H9325 Central auditory processing disorder: Secondary | ICD-10-CM | POA: Insufficient documentation

## 2019-01-26 DIAGNOSIS — E1140 Type 2 diabetes mellitus with diabetic neuropathy, unspecified: Secondary | ICD-10-CM

## 2019-01-26 DIAGNOSIS — E782 Mixed hyperlipidemia: Secondary | ICD-10-CM

## 2019-01-26 LAB — BAYER DCA HB A1C WAIVED: HB A1C (BAYER DCA - WAIVED): 5.8 % (ref <7.0)

## 2019-01-26 NOTE — Progress Notes (Signed)
 Subjective:  Patient ID: Franklin [name],  male    DOB: 10/14/1950  Age: 68 y.o.    CC: No chief complaint on file.   HPI Franklin [name] presents for  follow-up of hypertension. Patient has no history of headache chest pain or shortness of breath or recent cough. Patient also denies symptoms of TIA such as numbness weakness lateralizing. Patient denies side effects from medication. States taking it regularly.  Patient also  in for follow-up of elevated cholesterol. Doing well without complaints on current medication. Denies side effects  including myalgia and arthralgia and nausea. Also in today for liver function testing. Currently no chest pain, shortness of breath or other cardiovascular related symptoms noted.  Follow-up of diabetes. Patient does check blood sugar at home. Readings run between 90 and 120 Patient denies symptoms such as excessive hunger or urinary frequency, excessive hunger, nausea No significant hypoglycemic spells noted. Medications reviewed. Pt reports taking them regularly. Pt. denies complication/adverse reaction today.   Recently had cochlear implant. Following with audiology. Has had auditory processing disorder as well hampering improvement. History Franklin [name] has a past medical history of Charcot-Marie-Tooth disease, COPD (chronic obstructive pulmonary disease) (HCC), Coronary artery disease (2009), Coronary atherosclerosis of native coronary artery, Hearing loss, Other and unspecified hyperlipidemia, Peripheral neuropathy, Statin intolerance, and Type 2 diabetes mellitus (HCC).   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   His family history is not on file.He reports that he has been smoking cigarettes. He started smoking about 53 years ago. He has a 40.00 pack-year smoking history. He has never used smokeless tobacco. He reports that he does not drink  alcohol or use drugs.  Current Outpatient Medications on File Prior to Visit  Medication Sig Dispense Refill  . aspirin EC 81 MG tablet Take 81 mg by mouth daily.      . Blood Glucose Monitoring Suppl (ONE TOUCH ULTRA 2) w/Device KIT Test sugars 3 times daily 1 each 0  . clopidogrel (PLAVIX) 75 MG tablet Take 1 tablet (75 mg total) by mouth daily. 90 tablet 0  . gabapentin (NEURONTIN) 100 MG capsule Take 100 mg by mouth daily.     . glucose blood (ONE TOUCH TEST STRIPS) test strip Test 3 times daily   E11.9 100 each 12  . metFORMIN (GLUCOPHAGE) 500 MG tablet Take 1 tablet (500 mg total) by mouth 2 (two) times daily with a meal. 180 tablet 0  . ONE TOUCH LANCETS MISC 1 each by Does not apply route 2 (two) times daily. 100 each 11   No current facility-administered medications on file prior to visit.     ROS Review of Systems  Constitutional: Negative.   HENT: Positive for hearing loss.   Eyes: Negative for visual disturbance.  Respiratory: Negative for cough and shortness of breath.   Cardiovascular: Negative for chest pain and leg swelling.  Gastrointestinal: Negative for abdominal pain, diarrhea, nausea and vomiting.  Genitourinary: Negative for difficulty urinating.  Musculoskeletal: Negative for arthralgias and myalgias.  Skin: Negative for rash.  Neurological: Negative for headaches.  Psychiatric/Behavioral: Negative for sleep disturbance.    Objective:  BP 112/72   Pulse 83   Temp 98 F (36.7 C) (Oral)   Resp 16   Ht 6' 4" (1.93 m)   Wt 173 lb (78.5 kg)   SpO2 98%   BMI 21.06 kg/m   BP Readings from Last 3 Encounters:    01/26/19 112/72  09/30/18 100/87  05/20/18 125/73    Wt Readings from Last 3 Encounters:  01/26/19 173 lb (78.5 kg)  09/30/18 179 lb 7.3 oz (81.4 kg)  05/20/18 180 lb 2 oz (81.7 kg)     Physical Exam Constitutional:      General: He is not in acute distress.    Appearance: He is well-developed.  HENT:     Head: Normocephalic and  atraumatic.     Right Ear: External ear normal.     Left Ear: External ear normal.     Nose: Nose normal.  Eyes:     Conjunctiva/sclera: Conjunctivae normal.     Pupils: Pupils are equal, round, and reactive to light.  Neck:     Musculoskeletal: Normal range of motion and neck supple.  Cardiovascular:     Rate and Rhythm: Normal rate and regular rhythm.     Heart sounds: Normal heart sounds. No murmur.  Pulmonary:     Effort: Pulmonary effort is normal. No respiratory distress.     Breath sounds: Normal breath sounds. No wheezing or rales.  Abdominal:     Palpations: Abdomen is soft.     Tenderness: There is no abdominal tenderness.  Musculoskeletal: Normal range of motion.  Skin:    General: Skin is warm and dry.  Neurological:     Mental Status: He is alert and oriented to person, place, and time.     Deep Tendon Reflexes: Reflexes are normal and symmetric.  Psychiatric:        Behavior: Behavior normal.        Thought Content: Thought content normal.        Judgment: Judgment normal.     Diabetic Foot Exam - Simple   No data filed        Assessment & Plan:   Diagnoses and all orders for this visit:  Type 2 diabetes mellitus with diabetic neuropathy, without long-term current use of insulin (HCC) -     Bayer DCA Hb A1c Waived -     Microalbumin / creatinine urine ratio -     CBC with Differential/Platelet -     Lipid panel  Mixed hyperlipidemia -     CMP14+EGFR -     Lipid panel -     CBC with Differential/Platelet -     Lipid panel  Encounter for hepatitis C screening test for low risk patient -     Hepatitis C antibody  Cochlear implant in place  Auditory processing disorder   I have discontinued [name]'s fish oil-omega-3 fatty acids and Pseudoephedrine-Guaifenesin. I am also having him maintain his aspirin EC, gabapentin, ONE TOUCH LANCETS, glucose blood, ONE TOUCH ULTRA 2, clopidogrel, and metFORMIN.  No orders of the defined types were  placed in this encounter.    Follow-up: Return in about 3 months (around 04/28/2019).  Warren Stacks, M.D. 

## 2019-01-27 LAB — CBC WITH DIFFERENTIAL/PLATELET
Basophils Absolute: 0 10*3/uL (ref 0.0–0.2)
Basos: 1 %
EOS (ABSOLUTE): 0.1 10*3/uL (ref 0.0–0.4)
Eos: 2 %
Hematocrit: 43.5 % (ref 37.5–51.0)
Hemoglobin: 14.8 g/dL (ref 13.0–17.7)
Immature Grans (Abs): 0 10*3/uL (ref 0.0–0.1)
Immature Granulocytes: 0 %
Lymphocytes Absolute: 2.3 10*3/uL (ref 0.7–3.1)
Lymphs: 38 %
MCH: 31 pg (ref 26.6–33.0)
MCHC: 34 g/dL (ref 31.5–35.7)
MCV: 91 fL (ref 79–97)
Monocytes Absolute: 0.5 10*3/uL (ref 0.1–0.9)
Monocytes: 8 %
Neutrophils Absolute: 3.2 10*3/uL (ref 1.4–7.0)
Neutrophils: 51 %
Platelets: 214 10*3/uL (ref 150–450)
RBC: 4.78 x10E6/uL (ref 4.14–5.80)
RDW: 12.6 % (ref 11.6–15.4)
WBC: 6.1 10*3/uL (ref 3.4–10.8)

## 2019-01-27 LAB — LIPID PANEL
Chol/HDL Ratio: 4.6 ratio (ref 0.0–5.0)
Chol/HDL Ratio: 4.6 ratio (ref 0.0–5.0)
Cholesterol, Total: 138 mg/dL (ref 100–199)
Cholesterol, Total: 137 mg/dL (ref 100–199)
HDL: 30 mg/dL — ABNORMAL LOW (ref >39)
HDL: 30 mg/dL — ABNORMAL LOW (ref >39)
LDL Chol Calc (NIH): 58 mg/dL (ref 0–99)
LDL Chol Calc (NIH): 58 mg/dL (ref 0–99)
Triglycerides: 320 mg/dL — ABNORMAL HIGH (ref 0–149)
Triglycerides: 316 mg/dL — ABNORMAL HIGH (ref 0–149)
VLDL Cholesterol Cal: 50 mg/dL — ABNORMAL HIGH (ref 5–40)
VLDL Cholesterol Cal: 49 mg/dL — ABNORMAL HIGH (ref 5–40)

## 2019-01-27 LAB — CMP14+EGFR
ALT: 19 IU/L (ref 0–44)
AST: 14 IU/L (ref 0–40)
Albumin/Globulin Ratio: 2 (ref 1.2–2.2)
Albumin: 4.7 g/dL (ref 3.8–4.8)
Alkaline Phosphatase: 55 IU/L (ref 39–117)
BUN/Creatinine Ratio: 13 (ref 10–24)
BUN: 19 mg/dL (ref 8–27)
Bilirubin Total: 0.4 mg/dL (ref 0.0–1.2)
CO2: 23 mmol/L (ref 20–29)
Calcium: 9.5 mg/dL (ref 8.6–10.2)
Chloride: 100 mmol/L (ref 96–106)
Creatinine, Ser: 1.45 mg/dL — ABNORMAL HIGH (ref 0.76–1.27)
GFR calc Af Amer: 56 mL/min/{1.73_m2} — ABNORMAL LOW (ref >59)
GFR calc non Af Amer: 49 mL/min/{1.73_m2} — ABNORMAL LOW (ref >59)
Globulin, Total: 2.4 g/dL (ref 1.5–4.5)
Glucose: 131 mg/dL — ABNORMAL HIGH (ref 65–99)
Potassium: 4.4 mmol/L (ref 3.5–5.2)
Sodium: 138 mmol/L (ref 134–144)
Total Protein: 7.1 g/dL (ref 6.0–8.5)

## 2019-01-27 LAB — HEPATITIS C ANTIBODY: Hep C Virus Ab: <0.1 s/co ratio (ref 0.0–0.9)

## 2019-01-28 LAB — MICROALBUMIN / CREATININE URINE RATIO
Creatinine, Urine: 186.4 mg/dL
Microalb/Creat Ratio: 55 mg/g creat — ABNORMAL HIGH (ref 0–29)
Microalbumin, Urine: 101.8 ug/mL

## 2019-01-30 ENCOUNTER — Other Ambulatory Visit: Payer: Self-pay | Admitting: *Deleted

## 2019-01-30 ENCOUNTER — Other Ambulatory Visit: Payer: Self-pay | Admitting: Family Medicine

## 2019-01-30 MED ORDER — LISINOPRIL 10 MG PO TABS: 10.0000 mg | ORAL_TABLET | Freq: Every day | ORAL | 3 refills | Status: DC

## 2019-02-24 DIAGNOSIS — Z720 Tobacco use: Secondary | ICD-10-CM | POA: Insufficient documentation

## 2019-02-24 DIAGNOSIS — E785 Hyperlipidemia, unspecified: Secondary | ICD-10-CM | POA: Insufficient documentation

## 2019-02-24 NOTE — Progress Notes (Signed)
Cardiology Office Note   Date:  02/25/2019   ID:  Franklin [name], DOB [birth year], MRN [id number]  PCP:  Claretta Fraise, MD  Cardiologist:   Minus Breeding, MD   No chief complaint on file.     History of Present Illness: Franklin [name] is a 68 y.o. male who presents for follow up of CAD.   He returns for routine follow-up.  He is done well since I last saw him.  He has had a cochlear implant.  Can hear very little.  He is not as active as he used to be.  He is not hunting like he used to.  However, with his usual activities he denies any cardiovascular symptoms.  The patient denies any new symptoms such as chest discomfort, neck or arm discomfort. There has been no new shortness of breath, PND or orthopnea. There have been no reported palpitations, presyncope or syncope.     Past Medical History:  Diagnosis Date  . Charcot-Marie-Tooth disease   . COPD (chronic obstructive pulmonary disease) (HCC)    has smokers cough  . Coronary artery disease 2009   MI-stents x2  . Coronary atherosclerosis of native coronary artery    History of inferior myocardial infarction, treated with   CYPHER stenting of circumflex and right coronary artery, February 2004.Status post non-ST segment myocardial infarction treated with PROMUS drug-eluting stenting of distal right coronary artery and posterior descending (coronary) artery, August 2009.  EF 55-60%. Non-ischemic exercise Myoview; EF 50%, October 2009.    Marland Kitchen Hearing loss   . Other and unspecified hyperlipidemia   . Peripheral neuropathy   . Statin intolerance    Severe myalgias  . Type 2 diabetes mellitus ([place])     Past Surgical History:  Procedure Laterality Date  . APPENDECTOMY    . CARDIAC CATHETERIZATION  2009   stents x2  . COCHLEAR IMPLANT Left 09/30/2018   Procedure: COCHLEAR IMPLANT;  Surgeon: Leta Baptist, MD;  Location: [facility];  Service: ENT;  Laterality: Left;  . HERNIA REPAIR  80's     Current Outpatient  Medications  Medication Sig Dispense Refill  . aspirin EC 81 MG tablet Take 81 mg by mouth daily.      . clopidogrel (PLAVIX) 75 MG tablet Take 1 tablet (75 mg total) by mouth daily. 90 tablet 0  . gabapentin (NEURONTIN) 100 MG capsule Take 100 mg by mouth daily.     . metFORMIN (GLUCOPHAGE) 500 MG tablet Take 1 tablet (500 mg total) by mouth 2 (two) times daily with a meal. 180 tablet 0  . Blood Glucose Monitoring Suppl (ONE TOUCH ULTRA 2) w/Device KIT Test sugars 3 times daily 1 each 0  . glucose blood (ONE TOUCH TEST STRIPS) test strip Test 3 times daily   E11.9 100 each 12  . ONE TOUCH LANCETS MISC 1 each by Does not apply route 2 (two) times daily. 100 each 11   No current facility-administered medications for this visit.     Allergies:   Statins    ROS:  Please see the history of present illness.   Otherwise, review of none.   All other systems are reviewed and negative.    PHYSICAL EXAM: VS:  BP 128/70   Pulse 80   Ht '6\' 4"'$  (1.93 m)   Wt 175 lb (79.4 kg)   BMI 21.30 kg/m  , BMI Body mass index is 21.3 kg/m. GENERAL:  Well appearing NECK:  No jugular venous distention,  waveform within normal limits, carotid upstroke brisk and symmetric, no bruits, no thyromegaly LUNGS:  Clear to auscultation bilaterally CHEST:  Unremarkable HEART:  PMI not displaced or sustained,S1 and S2 within normal limits, no S3, no S4, no clicks, no rubs, no murmurs ABD:  Flat, positive bowel sounds normal in frequency in pitch, no bruits, no rebound, no guarding, no midline pulsatile mass, no hepatomegaly, no splenomegaly EXT:  2 plus pulses throughout, no edema, no cyanosis no clubbing   EKG:  EKG is  ordered today. The ekg ordered today demonstrates sinus rhythm, rate 80, left axis deviation, left anterior fascicular block, intervals within normal limits, no acute ST-T wave changes.   Recent Labs: 01/26/2019: ALT 19; BUN 19; Creatinine, Ser 1.45; Hemoglobin 14.8; Platelets 214; Potassium 4.4;  Sodium 138    Lipid Panel    Component Value Date/Time   CHOL 137 01/26/2019 1627   CHOL 138 01/26/2019 1627   CHOL 159 10/29/2012 0941   TRIG 316 (H) 01/26/2019 1627   TRIG 320 (H) 01/26/2019 1627   TRIG 170 (H) 10/29/2012 0941   HDL 30 (L) 01/26/2019 1627   HDL 30 (L) 01/26/2019 1627   HDL 30 (L) 10/29/2012 0941   CHOLHDL 4.6 01/26/2019 1627   CHOLHDL 4.6 01/26/2019 1627   CHOLHDL 5.7 12/03/2007 0100   VLDL 27 12/03/2007 0100   LDLCALC 58 01/26/2019 1627   LDLCALC 58 01/26/2019 1627   LDLCALC 95 10/29/2012 0941     Wt Readings from Last 3 Encounters:  02/25/19 175 lb (79.4 kg)  01/26/19 173 lb (78.5 kg)  09/30/18 179 lb 7.3 oz (81.4 kg)      Other studies Reviewed: Additional studies/ records that were reviewed today include: Labs Review of the above records demonstrates:  Please see elsewhere in the note.     ASSESSMENT AND PLAN:  CAD:  The patient has no new sypmtoms.  No further cardiovascular testing is indicated.  We will continue with aggressive risk reduction and meds as listed.  DM:    A1C was 5.8 which was down from 6.1.   No change in therapy.   HTN: His blood pressure is at target.  No change in therapy.   DYSLIPIDEMIA:  LDL was 58. .  Continue current meds.  TOBACCO:  He needs to stop smoking but he is only smoking a couple of cigs per month.  We talked about this.     Current medicines are reviewed at length with the patient today.  The patient does not have concerns regarding medicines.  The following changes have been made:  no change  Labs/ tests ordered today include: None  No orders of the defined types were placed in this encounter.    Disposition:   FU with me in one year.     Signed, Minus Breeding, MD  02/25/2019 3:59 PM    [facility]

## 2019-02-25 ENCOUNTER — Other Ambulatory Visit: Payer: Self-pay

## 2019-02-25 ENCOUNTER — Encounter: Payer: Self-pay | Admitting: Cardiology

## 2019-02-25 ENCOUNTER — Ambulatory Visit (INDEPENDENT_AMBULATORY_CARE_PROVIDER_SITE_OTHER): Payer: Medicare Other | Admitting: Cardiology

## 2019-02-25 VITALS — BP 128/70 | HR 80 | Ht 76.0 in | Wt 175.0 lb

## 2019-02-25 DIAGNOSIS — I10 Essential (primary) hypertension: Secondary | ICD-10-CM | POA: Diagnosis not present

## 2019-02-25 DIAGNOSIS — I2510 Atherosclerotic heart disease of native coronary artery without angina pectoris: Secondary | ICD-10-CM | POA: Diagnosis not present

## 2019-02-25 DIAGNOSIS — Z720 Tobacco use: Secondary | ICD-10-CM | POA: Diagnosis not present

## 2019-02-25 DIAGNOSIS — E785 Hyperlipidemia, unspecified: Secondary | ICD-10-CM | POA: Diagnosis not present

## 2019-02-25 NOTE — Patient Instructions (Signed)
Medication Instructions:  The current medical regimen is effective;  continue present plan and medications.  If you need a refill on your cardiac medications before your next appointment, please call your pharmacy.   Follow-Up: Follow up in 1 year with Dr. Hochrein.  You will receive a letter in the mail 2 months before you are due.  Please call us when you receive this letter to schedule your follow up appointment.  Thank you for choosing [facility]!!     

## 2019-03-04 ENCOUNTER — Encounter: Payer: Self-pay | Admitting: Family Medicine

## 2019-03-05 ENCOUNTER — Encounter: Payer: Self-pay | Admitting: Nurse Practitioner

## 2019-03-05 ENCOUNTER — Ambulatory Visit (INDEPENDENT_AMBULATORY_CARE_PROVIDER_SITE_OTHER): Payer: Medicare Other | Admitting: Nurse Practitioner

## 2019-03-05 DIAGNOSIS — H9201 Otalgia, right ear: Secondary | ICD-10-CM

## 2019-03-05 MED ORDER — FLUTICASONE PROPIONATE 50 MCG/ACT NA SUSP: 2.0000 | Freq: Every day | NASAL | 6 refills | Status: DC

## 2019-03-05 NOTE — Progress Notes (Signed)
   Virtual Visit via telephone Note Due to COVID-19 pandemic this visit was conducted virtually. This visit type was conducted due to national recommendations for restrictions regarding the COVID-19 Pandemic (e.g. social distancing, sheltering in place) in an effort to limit this patient's exposure and mitigate transmission in our community. All issues noted in this document were discussed and addressed.  A physical exam was not performed with this format.  I connected with Franklin [name] on 03/05/19 at 10:40 by telephone and verified that I am speaking with the correct person using two identifiers. Franklin [name] is currently located at home and his wife is currently with him during visit. The provider, Mary-Margaret Hassell Done, FNP is located in their office at time of visit.  I discussed the limitations, risks, security and privacy concerns of performing an evaluation and management service by telephone and the availability of in person appointments. I also discussed with the patient that there may be a patient responsible charge related to this service. The patient expressed understanding and agreed to proceed.   History and Present Illness: * infromation obtained from wife because he has trouble hearing on the phone.  Chief Complaint: right ear pain  HPI Had cochlear implantt on left ear several months ago. But now hw has an ear ache on the right side. Pain comes and goes.   Review of Systems  Constitutional: Negative for chills and fever.  HENT: Positive for congestion and ear pain (right).   Respiratory: Negative.   Cardiovascular: Negative.   Neurological: Negative.   Psychiatric/Behavioral: Negative.   All other systems reviewed and are negative.    Observations/Objective: Alert and oriented- answers all questions appropriately No distress    Assessment and Plan: Franklin [name] in today with chief complaint of No chief complaint on file.   1. Right ear pain Force  fluds Continue daily decongestant - fluticasone (FLONASE) 50 MCG/ACT nasal spray; Place 2 sprays into both nostrils daily.  Dispense: 16 g; Refill: 6   Follow Up Instructions: prn    I discussed the assessment and treatment plan with the patient. The patient was provided an opportunity to ask questions and all were answered. The patient agreed with the plan and demonstrated an understanding of the instructions.   The patient was advised to call back or seek an in-person evaluation if the symptoms worsen or if the condition fails to improve as anticipated.  The above assessment and management plan was discussed with the patient. The patient verbalized understanding of and has agreed to the management plan. Patient is aware to call the clinic if symptoms persist or worsen. Patient is aware when to return to the clinic for a follow-up visit. Patient educated on when it is appropriate to go to the emergency department.   Time call ended:  10:52  I provided 12 minutes of non-face-to-face time during this encounter.    Mary-Margaret Hassell Done, FNP

## 2019-03-06 ENCOUNTER — Other Ambulatory Visit: Payer: Self-pay | Admitting: Family Medicine

## 2019-03-06 ENCOUNTER — Telehealth: Payer: Self-pay | Admitting: Nurse Practitioner

## 2019-03-06 MED ORDER — AMOXICILLIN 875 MG PO TABS: 875.0000 mg | ORAL_TABLET | Freq: Two times a day (BID) | ORAL | 0 refills | Status: DC

## 2019-03-06 NOTE — Telephone Encounter (Signed)
Spoke with pt's wife and she says pt's ear pain has gotten worse and she would like an antibiotic sent in. Ok for antibiotic or does pt need to go to Urgent Care?

## 2019-03-06 NOTE — Telephone Encounter (Signed)
Sent in amoxicillin rx 

## 2019-03-06 NOTE — Telephone Encounter (Signed)
Pt aware.

## 2019-05-26 ENCOUNTER — Other Ambulatory Visit: Payer: Self-pay | Admitting: Family Medicine

## 2019-05-27 NOTE — Telephone Encounter (Signed)
Stacks. Pt needs his 3 mos appt (should have been Jan) mail order refill sent to pharmacy

## 2019-06-24 ENCOUNTER — Telehealth: Payer: Self-pay | Admitting: Family Medicine

## 2019-06-24 NOTE — Chronic Care Management (AMB) (Signed)
  Chronic Care Management   Note  06/24/2019 Name: Franklin [name] MRN: [id number] DOB: October 11, 1950  Franklin [name] is a 69 y.o. year old male who is a primary care patient of Stacks, Cletus Gash, MD. I reached out to [name] by phone today in response to a referral sent by Franklin [name] health plan.     Mr. Richert was given information about Chronic Care Management services today including:  1. CCM service includes personalized support from designated clinical staff supervised by his physician, including individualized plan of care and coordination with other care providers 2. 24/7 contact phone numbers for assistance for urgent and routine care needs. 3. Service will only be billed when office clinical staff spend 20 minutes or more in a month to coordinate care. 4. Only one practitioner may furnish and bill the service in a calendar month. 5. The patient may stop CCM services at any time (effective at the end of the month) by phone call to the office staff. 6. The patient will be responsible for cost sharing (co-pay) of up to 20% of the service fee (after annual deductible is met).  Patient agreed to services and verbal consent obtained.   Follow up plan: Telephone appointment with care management team member scheduled for: 08/21/2019  Noreene Larsson, [place], [place], [place] [address] Direct Dial: [phone] [email or web address] Website: [email or web address]

## 2019-06-25 ENCOUNTER — Encounter: Payer: Self-pay | Admitting: Family Medicine

## 2019-06-26 ENCOUNTER — Other Ambulatory Visit: Payer: Self-pay | Admitting: Family Medicine

## 2019-06-26 MED ORDER — SILDENAFIL CITRATE 20 MG PO TABS: ORAL_TABLET | ORAL | 5 refills | Status: DC

## 2019-08-15 ENCOUNTER — Other Ambulatory Visit: Payer: Self-pay | Admitting: Family Medicine

## 2019-08-20 ENCOUNTER — Ambulatory Visit: Payer: Medicare Other | Admitting: Family Medicine

## 2019-08-21 ENCOUNTER — Ambulatory Visit (INDEPENDENT_AMBULATORY_CARE_PROVIDER_SITE_OTHER): Payer: Medicare Other | Admitting: *Deleted

## 2019-08-21 DIAGNOSIS — E1140 Type 2 diabetes mellitus with diabetic neuropathy, unspecified: Secondary | ICD-10-CM | POA: Diagnosis not present

## 2019-08-21 DIAGNOSIS — E782 Mixed hyperlipidemia: Secondary | ICD-10-CM

## 2019-08-21 NOTE — Patient Instructions (Signed)
Visit Information  Goals Addressed            This Visit's Progress   . Chronic Disease Management Needs       CARE PLAN ENTRY (see longtitudinal plan of care for additional care plan information)  Current Barriers:  . Chronic Disease Management support, education, and care coordination needs related to DM, CAD, COPD, HLD, Bilateral deafness, charcot-marie-tooth disease  Clinical Goal(s) related to DM, CAD, COPD, HLD, Bilateral deafness, charcot-marie-tooth disease:  Over the next 90 days, patient will:  . Work with the care management team to address educational, disease management, and care coordination needs  . Call provider office for new or worsened signs and symptoms  . Call care management team with questions or concerns . Verbalize basic understanding of patient centered plan of care established today  Interventions related to DM, CAD, COPD, HLD, Bilateral deafness, charcot-marie-tooth disease:  . Evaluation of current treatment plans and patient's adherence to plan as established by provider . Assessed patient understanding of disease states . Assessed patient's education and care coordination needs . Provided disease specific education to patient  . Collaborated with appropriate clinical care team members regarding patient needs . Provided with RN Care Manager contact information and encouraged to reach out as needed  Patient Self Care Activities related to DM, CAD, COPD, HLD, Bilateral deafness, charcot-marie-tooth disease:  . Patient is unable to independently self-manage chronic health conditions  Initial goal documentation        The patient verbalized understanding of instructions provided today and declined a print copy of patient instruction materials.   Follow-up Plan The care management team will reach out to the patient again over the next 90 days.   Franklin [name], BSN, RN-BC Embedded Chronic Care Manager [facility] / [facility] Care  Management Direct Dial: [phone]

## 2019-08-21 NOTE — Chronic Care Management (AMB) (Signed)
Chronic Care Management   Initial Visit Note  08/21/2019 Name: Franklin [name] MRN: [id number] DOB: 25-Apr-1950  Referred by: Claretta Fraise, MD Reason for referral : Chronic Care Management (Initial Visit)   Franklin [name] is a 69 y.o. year old male who is a primary care patient of Stacks, Cletus Gash, MD. The CCM team was consulted for assistance with chronic disease management and care coordination needs related to DM, CAD, COPD, HLD, Bilateral deafness, charcot-marie-tooth disease  Review of patient status, including review of consultants reports, relevant laboratory and other test results, and collaboration with appropriate care team members and the patient's provider was performed as part of comprehensive patient evaluation and provision of chronic care management services.    Subjective: I spoke with Mr Franklin [name] wife, Franklin [name]" by telephone today. 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 Mr Franklin [name] can independently care for himself. He is active and enjoys hunting and fishing. There are no concerns related to food, transportation, housing, medication or safety.   SDOH (Social Determinants of Health) assessments performed: Yes See Care Plan activities for detailed interventions related to [facility]    Outpatient Encounter Medications as of 08/21/2019  Medication Sig  . aspirin EC 81 MG tablet Take 81 mg by mouth daily.    . Blood Glucose Monitoring Suppl (ONE TOUCH ULTRA 2) w/Device KIT Test sugars 3 times daily  . clopidogrel (PLAVIX) 75 MG tablet TAKE 1 TABLET BY MOUTH  DAILY  . fluticasone (FLONASE) 50 MCG/ACT nasal spray Place 2 sprays into both nostrils daily.  Marland Kitchen gabapentin (NEURONTIN) 100 MG capsule Take 100 mg by mouth daily.   Marland Kitchen glucose blood (ONE TOUCH TEST STRIPS) test strip Test 3 times daily   E11.9  . metFORMIN (GLUCOPHAGE) 500 MG tablet TAKE 1 TABLET BY MOUTH  TWICE DAILY WITH A MEAL  . ONE TOUCH LANCETS MISC  1 each by Does not apply route 2 (two) times daily.  . sildenafil (REVATIO) 20 MG tablet Take 2-5 pills at once, orally, with each sexual encounter  . [DISCONTINUED] amoxicillin (AMOXIL) 875 MG tablet Take 1 tablet (875 mg total) by mouth 2 (two) times daily. 1 po BID   No facility-administered encounter medications on file as of 08/21/2019.    Lab Results  Component Value Date   HGBA1C 5.8 01/26/2019   HGBA1C 6.1 05/20/2018   HGBA1C 6.1 10/02/2017   Lab Results  Component Value Date   MICROALBUR 20 10/13/2014   LDLCALC 58 01/26/2019   LDLCALC 58 01/26/2019   CREATININE 1.45 (H) 01/26/2019   Lab Results  Component Value Date   CHOL 137 01/26/2019   CHOL 138 01/26/2019   HDL 30 (L) 01/26/2019   HDL 30 (L) 01/26/2019   LDLCALC 58 01/26/2019   LDLCALC 58 01/26/2019   TRIG 316 (H) 01/26/2019   TRIG 320 (H) 01/26/2019   CHOLHDL 4.6 01/26/2019   CHOLHDL 4.6 01/26/2019     RN Care Plan           This Visit's Progress   . Chronic Disease Management Needs       CARE PLAN ENTRY (see longtitudinal plan of care for additional care plan information)  Current Barriers:  . Chronic Disease Management support, education, and care coordination needs related to DM, CAD, COPD, HLD, Bilateral deafness, charcot-marie-tooth disease  Clinical Goal(s) related to DM, CAD, COPD, HLD, Bilateral deafness, charcot-marie-tooth disease:  Over the next 90 days, patient will:  . Work with  the care management team to address educational, disease management, and care coordination needs  . Call provider office for new or worsened signs and symptoms  . Call care management team with questions or concerns . Verbalize basic understanding of patient centered plan of care established today  Interventions related to DM, CAD, COPD, HLD, Bilateral deafness, charcot-marie-tooth disease:  . Evaluation of current treatment plans and patient's adherence to plan as established by provider . Assessed patient  understanding of disease states . Assessed patient's education and care coordination needs . Provided disease specific education to patient  . Collaborated with appropriate clinical care team members regarding patient needs . Provided with RN Care Manager contact information and encouraged to reach out as needed  Patient Self Care Activities related to DM, CAD, COPD, HLD, Bilateral deafness, charcot-marie-tooth disease:  . Patient is unable to independently self-manage chronic health conditions  Initial goal documentation          Plan:   The care management team will reach out to the patient again over the next 90 days.   Franklin [name], BSN, RN-BC Embedded Chronic Care Manager [facility] [place] Management Direct Dial: [phone]

## 2019-09-03 ENCOUNTER — Ambulatory Visit (INDEPENDENT_AMBULATORY_CARE_PROVIDER_SITE_OTHER): Payer: Medicare Other | Admitting: *Deleted

## 2019-09-03 DIAGNOSIS — Z0000 Encounter for general adult medical examination without abnormal findings: Secondary | ICD-10-CM | POA: Diagnosis not present

## 2019-09-03 NOTE — Progress Notes (Signed)
MEDICARE ANNUAL WELLNESS VISIT  09/03/2019  Telephone Visit Disclaimer This Medicare AWV was conducted by telephone due to national recommendations for restrictions regarding the COVID-19 Pandemic (e.g. social distancing).  I verified, using two identifiers, that I am speaking with Franklin [name] or their authorized healthcare agent. I discussed the limitations, risks, security, and privacy concerns of performing an evaluation and management service by telephone and the potential availability of an in-person appointment in the future. The patient expressed understanding and agreed to proceed.   Subjective:  Franklin [name] is a 69 y.o. male patient of Stacks, Cletus Gash, MD who had a Medicare Annual Wellness Visit today via telephone. Franklin [name] is retired and lives with his wife Franklin [name]. They have 5 children. He reports that he is socially active and does interact with friends/family regularly. He is minimally physically active and enjoys fishing and hunting.   Patient Care Team: Claretta Fraise, MD as PCP - General (Family Medicine) Minus Breeding, MD as PCP - Cardiology (Cardiology) Ilean China, RN as Registered Nurse  Advanced Directives 09/03/2019 09/30/2018 09/24/2018 09/01/2018  Does Patient Have a Medical Advance Directive? No No No No  Would patient like information on creating a medical advance directive? No - Patient declined No - Patient declined No - Patient declined Yes (MAU/Ambulatory/Procedural Areas - Information given)    Hospital Utilization Over the Past 12 Months: # of hospitalizations or ER visits: 0 # of surgeries: 0  Review of Systems    Patient reports that his overall health is unchanged compared to last year.  History obtained by patient's wife as patient is deaf.   Patient Reported Readings (BP, Pulse, CBG, Weight, etc) none  Pain Assessment Pain : No/denies pain     Current Medications & Allergies (verified) Allergies as of 09/03/2019      Reactions   Statins    REACTION: myalgia      Medication List       Accurate as of Sep 03, 2019  9:37 AM. If you have any questions, ask your nurse or doctor.        aspirin EC 81 MG tablet Take 81 mg by mouth daily.   clopidogrel 75 MG tablet Commonly known as: PLAVIX TAKE 1 TABLET BY MOUTH  DAILY   fluticasone 50 MCG/ACT nasal spray Commonly known as: FLONASE Place 2 sprays into both nostrils daily.   gabapentin 100 MG capsule Commonly known as: NEURONTIN Take 100 mg by mouth daily.   glucose blood test strip Commonly known as: ONE TOUCH TEST STRIPS Test 3 times daily   E11.9   metFORMIN 500 MG tablet Commonly known as: GLUCOPHAGE TAKE 1 TABLET BY MOUTH  TWICE DAILY WITH A MEAL   ONE TOUCH LANCETS Misc 1 each by Does not apply route 2 (two) times daily.   ONE TOUCH ULTRA 2 w/Device Kit Test sugars 3 times daily   sildenafil 20 MG tablet Commonly known as: REVATIO Take 2-5 pills at once, orally, with each sexual encounter       History (reviewed): Past Medical History:  Diagnosis Date  . Charcot-Marie-Tooth disease   . COPD (chronic obstructive pulmonary disease) (HCC)    has smokers cough  . Coronary artery disease 2009   MI-stents x2  . Coronary atherosclerosis of native coronary artery    History of inferior myocardial infarction, treated with   CYPHER stenting of circumflex and right coronary artery, February 2004.Status post non-ST segment myocardial infarction treated with PROMUS drug-eluting stenting of  distal right coronary artery and posterior descending (coronary) artery, August 2009.  EF 55-60%. Non-ischemic exercise Myoview; EF 50%, October 2009.    Marland Kitchen Hearing loss   . Other and unspecified hyperlipidemia   . Peripheral neuropathy   . Statin intolerance    Severe myalgias  . Type 2 diabetes mellitus ([place])    Past Surgical History:  Procedure Laterality Date  . APPENDECTOMY    . CARDIAC CATHETERIZATION  2009   stents x2  . COCHLEAR IMPLANT Left  09/30/2018   Procedure: COCHLEAR IMPLANT;  Surgeon: Leta Baptist, MD;  Location: [facility];  Service: ENT;  Laterality: Left;  . HERNIA REPAIR  80's   Family History  Problem Relation Age of Onset  . Alzheimer's disease Mother   . Charcot-Marie-Tooth disease Sister   . Charcot-Marie-Tooth disease Daughter   . Hearing loss Son   . Stroke Son    Social History   Socioeconomic History  . Marital status: Married    Spouse name: Franklin [name]  . Number of children: 5  . Years of education: Not on file  . Highest education level: 12th grade  Occupational History  . Occupation: Retired  Tobacco Use  . Smoking status: Former Smoker    Packs/day: 1.00    Years: 40.00    Pack years: 40.00    Types: Cigarettes    Start date: 12/05/1965  . Smokeless tobacco: Current User  . Tobacco comment: Did quit for a while but started again.  Not as heavy  Substance and Sexual Activity  . Alcohol use: No    Alcohol/week: 0.0 standard drinks  . Drug use: No  . Sexual activity: Yes    Partners: Male  Other Topics Concern  . Not on file  Social History Narrative  . Not on file   Social Determinants of Health   Financial Resource Strain: Low Risk   . Difficulty of Paying Living Expenses: Not hard at all  Food Insecurity: No Food Insecurity  . Worried About Charity fundraiser in the Last Year: Never true  . Ran Out of Food in the Last Year: Never true  Transportation Needs: No Transportation Needs  . Lack of Transportation (Medical): No  . Lack of Transportation (Non-Medical): No  Physical Activity: Insufficiently Active  . Days of Exercise per Week: 3 days  . Minutes of Exercise per Session: 30 min  Stress: No Stress Concern Present  . Feeling of Stress : Not at all  Social Connections: Somewhat Isolated  . Frequency of Communication with Friends and Family: More than three times a week  . Frequency of Social Gatherings with Friends and Family: More than three times a week  .  Attends Religious Services: Never  . Active Member of Clubs or Organizations: No  . Attends Archivist Meetings: Never  . Marital Status: Married    Activities of Daily Living In your present state of health, do you have any difficulty performing the following activities: 09/03/2019 08/21/2019  Hearing? Y Y  Comment deaf bilateral deafness, has cochlear implant  Vision? N N  Difficulty concentrating or making decisions? Y N  Comment sometimes forgetful -  Walking or climbing stairs? N N  Dressing or bathing? N N  Doing errands, shopping? N N  Preparing Food and eating ? N N  Using the Toilet? N N  In the past six months, have you accidently leaked urine? N N  Do you have problems with loss of bowel control? N  N  Managing your Medications? N N  Managing your Finances? N N  Housekeeping or managing your Housekeeping? N N  Some recent data might be hidden    Patient Education/ Literacy How often do you need to have someone help you when you read instructions, pamphlets, or other written materials from your doctor or pharmacy?: 1 - Never What is the last grade level you completed in school?: 12+  Exercise Current Exercise Habits: The patient does not participate in regular exercise at present, Exercise limited by: respiratory conditions(s);cardiac condition(s)  Diet Patient reports consuming 3 meals a day and 3 snack(s) a day Patient reports that his primary diet is: Diabetic Patient reports that she does have regular access to food.   Depression Screen PHQ 2/9 Scores 09/03/2019 08/21/2019 01/26/2019 09/01/2018 05/20/2018 06/03/2017 01/16/2017  PHQ - 2 Score 0 0 0 0 0 0 0     Fall Risk Fall Risk  09/03/2019 01/26/2019 09/01/2018 05/20/2018 06/03/2017  Falls in the past year? 0 0 0 0 No  Number falls in past yr: - - - - -  Risk for fall due to : - - - - -     Objective:  Franklin [name] seemed alert and oriented and he participated appropriately during our telephone  visit.  Blood Pressure Weight BMI  BP Readings from Last 3 Encounters:  02/25/19 128/70  01/26/19 112/72  09/30/18 100/87   Wt Readings from Last 3 Encounters:  02/25/19 175 lb (79.4 kg)  01/26/19 173 lb (78.5 kg)  09/30/18 179 lb 7.3 oz (81.4 kg)   BMI Readings from Last 1 Encounters:  02/25/19 21.30 kg/m    *Unable to obtain current vital signs, weight, and BMI due to telephone visit type  Hearing/Vision  . Gideon did  seem to have difficulty with hearing/understanding during the telephone conversation ( wife assisted, patient deaf) . Reports that he has not had a formal eye exam by an eye care professional within the past year . Reports that he has had a formal hearing evaluation within the past year *Unable to fully assess hearing and vision during telephone visit type  Cognitive Function: 6CIT Screen 09/01/2018  What Year? 0 points  What month? 0 points  What time? 0 points  Count back from 20 0 points  Months in reverse 0 points  Repeat phrase 0 points  Total Score 0   (Normal:0-7, Significant for Dysfunction: >8)  Normal Cognitive Function Screening: unable to complete over the phone as patient is deaf   Immunization & Health Maintenance Record Immunization History  Administered Date(s) Administered  . Pneumococcal Conjugate-13 09/02/2018  . Tetanus 04/23/2008    Health Maintenance  Topic Date Due  . OPHTHALMOLOGY EXAM  Never done  . FOOT EXAM  10/03/2018  . HEMOGLOBIN A1C  07/27/2019  . PNA vac Low Risk Adult (2 of 2 - PPSV23) 09/02/2019  . COVID-19 Vaccine (1) 09/19/2019 (Originally 12/05/1966)  . COLONOSCOPY  01/26/2020 (Originally 12/06/1999)  . TETANUS/TDAP  01/26/2020 (Originally 04/23/2018)  . URINE MICROALBUMIN  01/26/2020  . Hepatitis C Screening  Completed  . INFLUENZA VACCINE  Discontinued       Assessment  This is a routine wellness examination for Franklin [name].  Health Maintenance: Due or Overdue Health Maintenance Due  Topic Date Due   . OPHTHALMOLOGY EXAM  Never done  . FOOT EXAM  10/03/2018  . HEMOGLOBIN A1C  07/27/2019  . PNA vac Low Risk Adult (2 of 2 - PPSV23) 09/02/2019  Franklin [name] does not need a referral for Community Assistance: Care Management:   no Social Work:    no Prescription Assistance:  no Nutrition/Diabetes Education:  no   Plan:  Personalized Goals Goals Addressed            This Visit's Progress   . Patient Stated       09/03/2019 AWV Goal: Keep All Scheduled Appointments  Over the next year, patient will attend all scheduled appointments with their PCP and any specialists that they see.        Personalized Health Maintenance & Screening Recommendations  Eye exam  Lung Cancer Screening Recommended: yes (Low Dose CT Chest recommended if Age 3-80 years, 30 pack-year currently smoking OR have quit w/in past 15 years) Hepatitis C Screening recommended: no HIV Screening recommended: no  Advanced Directives: Written information was not prepared per patient's request.  Referrals & Orders No orders of the defined types were placed in this encounter.   Follow-up Plan . Follow-up with Claretta Fraise, MD as planned . Schedule Eye Exam .    I have personally reviewed and noted the following in the patient's chart:   . Medical and social history . Use of alcohol, tobacco or illicit drugs  . Current medications and supplements . Functional ability and status . Nutritional status . Physical activity . Advanced directives . List of other physicians . Hospitalizations, surgeries, and ER visits in previous 12 months . Vitals . Screenings to include cognitive, depression, and falls . Referrals and appointments  In addition, I have reviewed and discussed with Franklin [name] certain preventive protocols, quality metrics, and best practice recommendations. A written personalized care plan for preventive services as well as general preventive health recommendations is  available and can be mailed to the patient at his request.      Baldomero Lamy, LPN  12/15/1751

## 2019-09-24 ENCOUNTER — Encounter: Payer: Self-pay | Admitting: Family Medicine

## 2019-09-24 ENCOUNTER — Other Ambulatory Visit: Payer: Self-pay

## 2019-09-24 ENCOUNTER — Ambulatory Visit (INDEPENDENT_AMBULATORY_CARE_PROVIDER_SITE_OTHER): Payer: Medicare Other | Admitting: Family Medicine

## 2019-09-24 VITALS — BP 134/80 | HR 69 | Temp 97.8°F | Ht 76.0 in | Wt 184.0 lb

## 2019-09-24 DIAGNOSIS — E1140 Type 2 diabetes mellitus with diabetic neuropathy, unspecified: Secondary | ICD-10-CM | POA: Diagnosis not present

## 2019-09-24 DIAGNOSIS — E782 Mixed hyperlipidemia: Secondary | ICD-10-CM

## 2019-09-24 LAB — CBC WITH DIFFERENTIAL/PLATELET
Basophils Absolute: 0 10*3/uL (ref 0.0–0.2)
Basos: 1 %
EOS (ABSOLUTE): 0.1 10*3/uL (ref 0.0–0.4)
Eos: 2 %
Hematocrit: 43 % (ref 37.5–51.0)
Hemoglobin: 15 g/dL (ref 13.0–17.7)
Immature Grans (Abs): 0 10*3/uL (ref 0.0–0.1)
Immature Granulocytes: 0 %
Lymphocytes Absolute: 2 10*3/uL (ref 0.7–3.1)
Lymphs: 34 %
MCH: 30.2 pg (ref 26.6–33.0)
MCHC: 34.9 g/dL (ref 31.5–35.7)
MCV: 87 fL (ref 79–97)
Monocytes Absolute: 0.5 10*3/uL (ref 0.1–0.9)
Monocytes: 9 %
Neutrophils Absolute: 3.2 10*3/uL (ref 1.4–7.0)
Neutrophils: 54 %
Platelets: 215 10*3/uL (ref 150–450)
RBC: 4.96 x10E6/uL (ref 4.14–5.80)
RDW: 12 % (ref 11.6–15.4)
WBC: 5.9 10*3/uL (ref 3.4–10.8)

## 2019-09-24 LAB — CMP14+EGFR
ALT: 17 IU/L (ref 0–44)
AST: 14 IU/L (ref 0–40)
Albumin/Globulin Ratio: 1.9 (ref 1.2–2.2)
Albumin: 4.6 g/dL (ref 3.8–4.8)
Alkaline Phosphatase: 58 IU/L (ref 48–121)
BUN/Creatinine Ratio: 15 (ref 10–24)
BUN: 21 mg/dL (ref 8–27)
Bilirubin Total: 0.5 mg/dL (ref 0.0–1.2)
CO2: 23 mmol/L (ref 20–29)
Calcium: 9.8 mg/dL (ref 8.6–10.2)
Chloride: 100 mmol/L (ref 96–106)
Creatinine, Ser: 1.36 mg/dL — ABNORMAL HIGH (ref 0.76–1.27)
GFR calc Af Amer: 61 mL/min/{1.73_m2} (ref >59)
GFR calc non Af Amer: 53 mL/min/{1.73_m2} — ABNORMAL LOW (ref >59)
Globulin, Total: 2.4 g/dL (ref 1.5–4.5)
Glucose: 121 mg/dL — ABNORMAL HIGH (ref 65–99)
Potassium: 4.7 mmol/L (ref 3.5–5.2)
Sodium: 139 mmol/L (ref 134–144)
Total Protein: 7 g/dL (ref 6.0–8.5)

## 2019-09-24 LAB — LIPID PANEL
Chol/HDL Ratio: 5.1 ratio — ABNORMAL HIGH (ref 0.0–5.0)
Cholesterol, Total: 164 mg/dL (ref 100–199)
HDL: 32 mg/dL — ABNORMAL LOW (ref >39)
LDL Chol Calc (NIH): 98 mg/dL (ref 0–99)
Triglycerides: 194 mg/dL — ABNORMAL HIGH (ref 0–149)
VLDL Cholesterol Cal: 34 mg/dL (ref 5–40)

## 2019-09-24 LAB — BAYER DCA HB A1C WAIVED: HB A1C (BAYER DCA - WAIVED): 6 % (ref <7.0)

## 2019-09-24 NOTE — Progress Notes (Signed)
Subjective:  Patient ID: Franklin [name], male    DOB: 1950-10-07  Age: 69 y.o. MRN: [id number]  CC: Follow-up (6 month)   HPI PESACH FRISCH presents for presents forFollow-up of diabetes. Patient checks blood sugar at home.  Patient denies symptoms such as polyuria, polydipsia, excessive hunger, nausea No significant hypoglycemic spells noted. Medications reviewed. Pt reports taking them regularly without complication/adverse reaction being reported today.  Checking feet daily.    Depression screen [facility] 2/9 09/24/2019 09/03/2019 08/21/2019  Decreased Interest 0 0 0  Down, Depressed, Hopeless 0 0 0  PHQ - 2 Score 0 0 0    History Reedy has a past medical history of Charcot-Marie-Tooth disease, COPD (chronic obstructive pulmonary disease) ([place]), Coronary artery disease (2009), Coronary atherosclerosis of native coronary artery, Hearing loss, Other and unspecified hyperlipidemia, Peripheral neuropathy, Statin intolerance, and Type 2 diabetes mellitus ([place]).   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   His family history includes Alzheimer's disease in his mother; Charcot-Marie-Tooth disease in his daughter and sister; Hearing loss in his son; Stroke in his son.He reports that he has quit smoking. His smoking use included cigarettes. He started smoking about 53 years ago. He has a 40.00 pack-year smoking history. He uses smokeless tobacco. He reports that he does not drink alcohol or use drugs.    ROS Review of Systems  Constitutional: Negative for fever.  Respiratory: Negative for shortness of breath.   Cardiovascular: Negative for chest pain.  Musculoskeletal: Negative for arthralgias.  Skin: Negative for rash.    Objective:  BP 134/80   Pulse 69   Temp 97.8 F (36.6 C) (Temporal)   Ht '6\' 4"'$  (1.93 m)   Wt 184 lb (83.5 kg)   BMI 22.40 kg/m   BP Readings from  Last 3 Encounters:  09/24/19 134/80  02/25/19 128/70  01/26/19 112/72    Wt Readings from Last 3 Encounters:  09/24/19 184 lb (83.5 kg)  02/25/19 175 lb (79.4 kg)  01/26/19 173 lb (78.5 kg)     Physical Exam Vitals reviewed.  Constitutional:      Appearance: He is well-developed.  HENT:     Head: Normocephalic and atraumatic.     Right Ear: Tympanic membrane and external ear normal. No decreased hearing noted.     Left Ear: Tympanic membrane and external ear normal. No decreased hearing noted.     Mouth/Throat:     Pharynx: No oropharyngeal exudate or posterior oropharyngeal erythema.  Eyes:     Pupils: Pupils are equal, round, and reactive to light.  Cardiovascular:     Rate and Rhythm: Normal rate and regular rhythm.     Heart sounds: No murmur.  Pulmonary:     Effort: No respiratory distress.     Breath sounds: Normal breath sounds.  Abdominal:     General: Bowel sounds are normal.     Palpations: Abdomen is soft. There is no mass.     Tenderness: There is no abdominal tenderness.  Musculoskeletal:     Cervical back: Normal range of motion and neck supple.       Assessment & Plan:   Kimberley was seen today for follow-up.  Diagnoses and all orders for this visit:  Type 2 diabetes mellitus with diabetic neuropathy, without long-term current use of insulin (HCC) -     CBC with Differential/Platelet -     CMP14+EGFR -  Lipid panel -     Bayer DCA Hb A1c Waived  Mixed hyperlipidemia -     CBC with Differential/Platelet -     CMP14+EGFR -     Lipid panel -     Bayer DCA Hb A1c Waived       I am having [name] maintain his aspirin EC, gabapentin, ONE TOUCH LANCETS, glucose blood, ONE TOUCH ULTRA 2, fluticasone, sildenafil, clopidogrel, and metFORMIN.  Allergies as of 09/24/2019      Reactions   Statins    REACTION: myalgia      Medication List       Accurate as of September 24, 2019 11:59 PM. If you have any questions, ask your nurse or doctor.         aspirin EC 81 MG tablet Take 81 mg by mouth daily.   clopidogrel 75 MG tablet Commonly known as: PLAVIX TAKE 1 TABLET BY MOUTH  DAILY   fluticasone 50 MCG/ACT nasal spray Commonly known as: FLONASE Place 2 sprays into both nostrils daily.   gabapentin 100 MG capsule Commonly known as: NEURONTIN Take 100 mg by mouth daily.   glucose blood test strip Commonly known as: ONE TOUCH TEST STRIPS Test 3 times daily   E11.9   metFORMIN 500 MG tablet Commonly known as: GLUCOPHAGE TAKE 1 TABLET BY MOUTH  TWICE DAILY WITH A MEAL   ONE TOUCH LANCETS Misc 1 each by Does not apply route 2 (two) times daily.   ONE TOUCH ULTRA 2 w/Device Kit Test sugars 3 times daily   sildenafil 20 MG tablet Commonly known as: REVATIO Take 2-5 pills at once, orally, with each sexual encounter        Follow-up: Return in about 3 months (around 12/25/2019).  Claretta Fraise, M.D.

## 2019-09-25 NOTE — Progress Notes (Signed)
Hello Maksim,  Your lab result is normal and/or stable.Some minor variations that are not significant are commonly marked abnormal, but do not represent any medical problem for you.  Best regards, Marbeth Smedley, M.D.

## 2019-09-27 ENCOUNTER — Encounter: Payer: Self-pay | Admitting: Family Medicine

## 2019-10-16 DIAGNOSIS — H903 Sensorineural hearing loss, bilateral: Secondary | ICD-10-CM | POA: Diagnosis not present

## 2019-10-23 DIAGNOSIS — H903 Sensorineural hearing loss, bilateral: Secondary | ICD-10-CM | POA: Diagnosis not present

## 2019-10-30 ENCOUNTER — Other Ambulatory Visit: Payer: Self-pay | Admitting: Family Medicine

## 2019-10-30 DIAGNOSIS — H903 Sensorineural hearing loss, bilateral: Secondary | ICD-10-CM | POA: Diagnosis not present

## 2019-11-03 DIAGNOSIS — H903 Sensorineural hearing loss, bilateral: Secondary | ICD-10-CM | POA: Diagnosis not present

## 2019-11-10 DIAGNOSIS — H903 Sensorineural hearing loss, bilateral: Secondary | ICD-10-CM | POA: Diagnosis not present

## 2019-11-11 ENCOUNTER — Ambulatory Visit: Payer: Medicare Other | Admitting: *Deleted

## 2019-11-11 DIAGNOSIS — Z9621 Cochlear implant status: Secondary | ICD-10-CM

## 2019-11-11 DIAGNOSIS — E1140 Type 2 diabetes mellitus with diabetic neuropathy, unspecified: Secondary | ICD-10-CM

## 2019-11-11 DIAGNOSIS — E782 Mixed hyperlipidemia: Secondary | ICD-10-CM

## 2019-11-11 NOTE — Chronic Care Management (AMB) (Signed)
  Chronic Care Management   Follow Up Note   11/11/2019 Name: Franklin [name] MRN: [id number] DOB: Dec 11, 1950  Referred by: Franklin Fraise, MD Reason for referral : Chronic Care Management (RN follow up)   Franklin [name] is a 69 y.o. year old male who is a primary care patient of Stacks, Franklin Gash, MD. The CCM team was consulted for assistance with chronic disease management and care coordination needs.    Review of patient status, including review of consultants reports, relevant laboratory and other test results, and collaboration with appropriate care team members and the patient's provider was performed as part of comprehensive patient evaluation and provision of chronic care management services.    I spoke with Franklin [name] wife, Franklin [name], by telephone today regarding management of his chronic medical conditions. He is working with speech therapy and cochlear implant team. He does not have any CCM needs at this time and they feel that they are managing his chronic medical conditions well. They were appreciate of the call, but would like to be removed from the program. They will reach out in the future if needed.   SDOH (Social Determinants of Health) assessments performed: Yes See Care Plan activities for detailed interventions related to [facility])     Outpatient Encounter Medications as of 11/11/2019  Medication Sig  . aspirin EC 81 MG tablet Take 81 mg by mouth daily.    . Blood Glucose Monitoring Suppl (ONE TOUCH ULTRA 2) w/Device KIT Test sugars 3 times daily  . clopidogrel (PLAVIX) 75 MG tablet TAKE 1 TABLET BY MOUTH  DAILY  . fluticasone (FLONASE) 50 MCG/ACT nasal spray Place 2 sprays into both nostrils daily. (Patient not taking: Reported on 09/24/2019)  . gabapentin (NEURONTIN) 100 MG capsule Take 100 mg by mouth daily.   Marland Kitchen glucose blood (ONE TOUCH TEST STRIPS) test strip Test 3 times daily   E11.9  . metFORMIN (GLUCOPHAGE) 500 MG tablet TAKE 1 TABLET BY MOUTH  TWICE DAILY WITH A  MEAL  . ONE TOUCH LANCETS MISC 1 each by Does not apply route 2 (two) times daily.  . sildenafil (REVATIO) 20 MG tablet Take 2-5 pills at once, orally, with each sexual encounter   No facility-administered encounter medications on file as of 11/11/2019.     Lab Results  Component Value Date   HGBA1C 6.0 09/24/2019   HGBA1C 5.8 01/26/2019   HGBA1C 6.1 05/20/2018   Lab Results  Component Value Date   MICROALBUR 20 10/13/2014   LDLCALC 98 09/24/2019   CREATININE 1.36 (H) 09/24/2019     Plan:   CCM enrollment status changed to "previously enrolled" as per patient request on 11/11/19 to discontinue enrollment. Case closed to case management services in primary care home.    [name], BSN, RN-BC Embedded Chronic Care Manager [facility] Direct Dial: [phone]

## 2019-11-17 DIAGNOSIS — H903 Sensorineural hearing loss, bilateral: Secondary | ICD-10-CM | POA: Diagnosis not present

## 2019-11-24 DIAGNOSIS — H903 Sensorineural hearing loss, bilateral: Secondary | ICD-10-CM | POA: Diagnosis not present

## 2019-12-01 DIAGNOSIS — H903 Sensorineural hearing loss, bilateral: Secondary | ICD-10-CM | POA: Diagnosis not present

## 2019-12-08 DIAGNOSIS — H903 Sensorineural hearing loss, bilateral: Secondary | ICD-10-CM | POA: Diagnosis not present

## 2019-12-22 DIAGNOSIS — H903 Sensorineural hearing loss, bilateral: Secondary | ICD-10-CM | POA: Diagnosis not present

## 2019-12-29 DIAGNOSIS — H903 Sensorineural hearing loss, bilateral: Secondary | ICD-10-CM | POA: Diagnosis not present

## 2020-01-05 DIAGNOSIS — H903 Sensorineural hearing loss, bilateral: Secondary | ICD-10-CM | POA: Diagnosis not present

## 2020-01-14 DIAGNOSIS — H903 Sensorineural hearing loss, bilateral: Secondary | ICD-10-CM | POA: Diagnosis not present

## 2020-01-19 DIAGNOSIS — H903 Sensorineural hearing loss, bilateral: Secondary | ICD-10-CM | POA: Diagnosis not present

## 2020-01-26 DIAGNOSIS — H903 Sensorineural hearing loss, bilateral: Secondary | ICD-10-CM | POA: Diagnosis not present

## 2020-02-03 DIAGNOSIS — H903 Sensorineural hearing loss, bilateral: Secondary | ICD-10-CM | POA: Diagnosis not present

## 2020-02-17 DIAGNOSIS — H903 Sensorineural hearing loss, bilateral: Secondary | ICD-10-CM | POA: Diagnosis not present

## 2020-02-24 DIAGNOSIS — H903 Sensorineural hearing loss, bilateral: Secondary | ICD-10-CM | POA: Diagnosis not present

## 2020-03-03 DIAGNOSIS — H903 Sensorineural hearing loss, bilateral: Secondary | ICD-10-CM | POA: Diagnosis not present

## 2020-03-04 DIAGNOSIS — H903 Sensorineural hearing loss, bilateral: Secondary | ICD-10-CM | POA: Diagnosis not present

## 2020-03-09 DIAGNOSIS — H903 Sensorineural hearing loss, bilateral: Secondary | ICD-10-CM | POA: Diagnosis not present

## 2020-03-15 DIAGNOSIS — H903 Sensorineural hearing loss, bilateral: Secondary | ICD-10-CM | POA: Diagnosis not present

## 2020-03-24 ENCOUNTER — Ambulatory Visit: Payer: Medicare Other | Admitting: Family Medicine

## 2020-03-24 DIAGNOSIS — H903 Sensorineural hearing loss, bilateral: Secondary | ICD-10-CM | POA: Diagnosis not present

## 2020-03-29 ENCOUNTER — Encounter: Payer: Self-pay | Admitting: Family Medicine

## 2020-03-29 ENCOUNTER — Ambulatory Visit (INDEPENDENT_AMBULATORY_CARE_PROVIDER_SITE_OTHER): Payer: Medicare Other | Admitting: Family Medicine

## 2020-03-29 ENCOUNTER — Other Ambulatory Visit: Payer: Self-pay

## 2020-03-29 VITALS — BP 129/73 | HR 80 | Temp 97.4°F | Resp 20 | Ht 76.0 in | Wt 182.4 lb

## 2020-03-29 DIAGNOSIS — E782 Mixed hyperlipidemia: Secondary | ICD-10-CM

## 2020-03-29 DIAGNOSIS — E1140 Type 2 diabetes mellitus with diabetic neuropathy, unspecified: Secondary | ICD-10-CM

## 2020-03-29 LAB — LIPID PANEL

## 2020-03-29 LAB — BAYER DCA HB A1C WAIVED: HB A1C (BAYER DCA - WAIVED): 6.1 % (ref <7.0)

## 2020-03-29 NOTE — Progress Notes (Signed)
Subjective:  Patient ID: Franklin [name], male    DOB: February 28, 1951  Age: 69 y.o. MRN: [id number]  CC: Medical Management of Chronic Issues   HPI Franklin [name] presents forFollow-up of diabetes. Patient not checking blood sugar at home.  Patient denies symptoms such as polyuria, polydipsia, excessive hunger, nausea No significant hypoglycemic spells noted. Medications reviewed. Pt reports taking them regularly without complication/adverse reaction being reported today.    History Franklin [name] has a past medical history of Charcot-Marie-Tooth disease, COPD (chronic obstructive pulmonary disease) ([place]), Coronary artery disease (2009), Coronary atherosclerosis of native coronary artery, Hearing loss, Other and unspecified hyperlipidemia, Peripheral neuropathy, Statin intolerance, and Type 2 diabetes mellitus ([place]).   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   His family history includes Alzheimer's disease in his mother; Charcot-Marie-Tooth disease in his daughter and sister; Hearing loss in his son; Stroke in his son.He reports that he has quit smoking. His smoking use included cigarettes. He started smoking about 54 years ago. He has a 40.00 pack-year smoking history. He uses smokeless tobacco. He reports that he does not drink alcohol and does not use drugs.  Current Outpatient Medications on File Prior to Visit  Medication Sig Dispense Refill  . aspirin EC 81 MG tablet Take 81 mg by mouth daily.      . Blood Glucose Monitoring Suppl (ONE TOUCH ULTRA 2) w/Device KIT Test sugars 3 times daily 1 each 0  . clopidogrel (PLAVIX) 75 MG tablet TAKE 1 TABLET BY MOUTH  DAILY 90 tablet 3  . glucose blood (ONE TOUCH TEST STRIPS) test strip Test 3 times daily   E11.9 100 each 12  . metFORMIN (GLUCOPHAGE) 500 MG tablet TAKE 1 TABLET BY MOUTH  TWICE DAILY WITH A MEAL 180 tablet 3  . ONE TOUCH  LANCETS MISC 1 each by Does not apply route 2 (two) times daily. 100 each 11  . sildenafil (REVATIO) 20 MG tablet Take 2-5 pills at once, orally, with each sexual encounter 50 tablet 5   No current facility-administered medications on file prior to visit.    ROS Review of Systems  Constitutional: Negative for fever.  HENT: Positive for hearing loss.   Respiratory: Negative for shortness of breath.   Cardiovascular: Negative for chest pain.  Musculoskeletal: Negative for arthralgias.  Skin: Negative for rash.    Objective:  BP 129/73   Pulse 80   Temp (!) 97.4 F (36.3 C) (Temporal)   Resp 20   Ht $R'6\' 4"'ee$  (1.93 m)   Wt 182 lb 6 oz (82.7 kg)   SpO2 99%   BMI 22.20 kg/m   BP Readings from Last 3 Encounters:  03/29/20 129/73  09/24/19 134/80  02/25/19 128/70    Wt Readings from Last 3 Encounters:  03/29/20 182 lb 6 oz (82.7 kg)  09/24/19 184 lb (83.5 kg)  02/25/19 175 lb (79.4 kg)     Physical Exam Vitals reviewed.  Constitutional:      Appearance: He is well-developed.  HENT:     Head: Normocephalic and atraumatic.     Right Ear: Tympanic membrane and external ear normal. No decreased hearing noted.     Left Ear: Tympanic membrane and external ear normal. No decreased hearing noted.     Mouth/Throat:     Pharynx: No oropharyngeal exudate or posterior oropharyngeal erythema.  Eyes:     Pupils: Pupils are equal, round, and reactive to  light.  Cardiovascular:     Rate and Rhythm: Normal rate and regular rhythm.     Heart sounds: No murmur heard.   Pulmonary:     Effort: No respiratory distress.     Breath sounds: Normal breath sounds.  Abdominal:     General: Bowel sounds are normal.     Palpations: Abdomen is soft. There is no mass.     Tenderness: There is no abdominal tenderness.  Musculoskeletal:     Cervical back: Normal range of motion and neck supple.       Assessment & Plan:   Franklin [name] was seen today for medical management of chronic  issues.  Diagnoses and all orders for this visit:  Type 2 diabetes mellitus with diabetic neuropathy, without long-term current use of insulin (HCC) -     Microalbumin / creatinine urine ratio -     Bayer DCA Hb A1c Waived -     CBC with Differential/Platelet -     CMP14+EGFR -     Lipid panel  Mixed hyperlipidemia -     Microalbumin / creatinine urine ratio -     Bayer DCA Hb A1c Waived -     CBC with Differential/Platelet -     CMP14+EGFR -     Lipid panel      I have discontinued Franklin [name]'s gabapentin and fluticasone. I am also having him maintain his aspirin EC, ONE TOUCH LANCETS, glucose blood, ONE TOUCH ULTRA 2, sildenafil, metFORMIN, and clopidogrel.  No orders of the defined types were placed in this encounter.    Follow-up: Return in about 3 months (around 06/27/2020) for diabetes.  Claretta Fraise, M.D.

## 2020-03-30 LAB — CMP14+EGFR
ALT: 18 IU/L (ref 0–44)
AST: 17 IU/L (ref 0–40)
Albumin/Globulin Ratio: 2 (ref 1.2–2.2)
Albumin: 5 g/dL — ABNORMAL HIGH (ref 3.8–4.8)
Alkaline Phosphatase: 58 IU/L (ref 44–121)
BUN/Creatinine Ratio: 15 (ref 10–24)
BUN: 22 mg/dL (ref 8–27)
Bilirubin Total: 0.5 mg/dL (ref 0.0–1.2)
CO2: 23 mmol/L (ref 20–29)
Calcium: 9.9 mg/dL (ref 8.6–10.2)
Chloride: 98 mmol/L (ref 96–106)
Creatinine, Ser: 1.42 mg/dL — ABNORMAL HIGH (ref 0.76–1.27)
GFR calc Af Amer: 57 mL/min/{1.73_m2} — ABNORMAL LOW (ref >59)
GFR calc non Af Amer: 50 mL/min/{1.73_m2} — ABNORMAL LOW (ref >59)
Globulin, Total: 2.5 g/dL (ref 1.5–4.5)
Glucose: 134 mg/dL — ABNORMAL HIGH (ref 65–99)
Potassium: 4.9 mmol/L (ref 3.5–5.2)
Sodium: 138 mmol/L (ref 134–144)
Total Protein: 7.5 g/dL (ref 6.0–8.5)

## 2020-03-30 LAB — CBC WITH DIFFERENTIAL/PLATELET
Basophils Absolute: 0 10*3/uL (ref 0.0–0.2)
Basos: 1 %
EOS (ABSOLUTE): 0.1 10*3/uL (ref 0.0–0.4)
Eos: 2 %
Hematocrit: 46.2 % (ref 37.5–51.0)
Hemoglobin: 15.6 g/dL (ref 13.0–17.7)
Immature Grans (Abs): 0 10*3/uL (ref 0.0–0.1)
Immature Granulocytes: 0 %
Lymphocytes Absolute: 2.2 10*3/uL (ref 0.7–3.1)
Lymphs: 34 %
MCH: 30.1 pg (ref 26.6–33.0)
MCHC: 33.8 g/dL (ref 31.5–35.7)
MCV: 89 fL (ref 79–97)
Monocytes Absolute: 0.6 10*3/uL (ref 0.1–0.9)
Monocytes: 9 %
Neutrophils Absolute: 3.4 10*3/uL (ref 1.4–7.0)
Neutrophils: 54 %
Platelets: 231 10*3/uL (ref 150–450)
RBC: 5.18 x10E6/uL (ref 4.14–5.80)
RDW: 12.3 % (ref 11.6–15.4)
WBC: 6.3 10*3/uL (ref 3.4–10.8)

## 2020-03-30 LAB — LIPID PANEL
Chol/HDL Ratio: 4.9 ratio (ref 0.0–5.0)
Cholesterol, Total: 170 mg/dL (ref 100–199)
HDL: 35 mg/dL — ABNORMAL LOW (ref >39)
LDL Chol Calc (NIH): 99 mg/dL (ref 0–99)
Triglycerides: 208 mg/dL — ABNORMAL HIGH (ref 0–149)
VLDL Cholesterol Cal: 36 mg/dL (ref 5–40)

## 2020-04-01 ENCOUNTER — Ambulatory Visit: Payer: Medicare Other | Admitting: Family Medicine

## 2020-04-01 DIAGNOSIS — H903 Sensorineural hearing loss, bilateral: Secondary | ICD-10-CM | POA: Diagnosis not present

## 2020-04-06 DIAGNOSIS — H903 Sensorineural hearing loss, bilateral: Secondary | ICD-10-CM | POA: Diagnosis not present

## 2020-04-06 NOTE — Progress Notes (Signed)
Hello Demyan,  Your lab result is normal and/or stable.Some minor variations that are not significant are commonly marked abnormal, but do not represent any medical problem for you.  Best regards, Shakeya Kerkman, M.D.

## 2020-04-20 DIAGNOSIS — H903 Sensorineural hearing loss, bilateral: Secondary | ICD-10-CM | POA: Diagnosis not present

## 2020-04-25 DIAGNOSIS — I10 Essential (primary) hypertension: Secondary | ICD-10-CM | POA: Insufficient documentation

## 2020-04-25 NOTE — Progress Notes (Deleted)
Cardiology Office Note   Date:  04/25/2020   ID:  Franklin [name], DOB 1950-06-24, MRN [id number]  PCP:  Claretta Fraise, MD  Cardiologist:   Minus Breeding, MD   No chief complaint on file.     History of Present Illness: Franklin [name] is a 70 y.o. male who presents for follow up of CAD.   ***   *** He returns for routine follow-up.  He is done well since I last saw him.  He has had a cochlear implant.  Can hear very little.  He is not as active as he used to be.  He is not hunting like he used to.  However, with his usual activities he denies any cardiovascular symptoms.  The patient denies any new symptoms such as chest discomfort, neck or arm discomfort. There has been no new shortness of breath, PND or orthopnea. There have been no reported palpitations, presyncope or syncope.     Past Medical History:  Diagnosis Date  . Charcot-Marie-Tooth disease   . COPD (chronic obstructive pulmonary disease) (HCC)    has smokers cough  . Coronary artery disease 2009   MI-stents x2  . Coronary atherosclerosis of native coronary artery    History of inferior myocardial infarction, treated with   CYPHER stenting of circumflex and right coronary artery, February 2004.Status post non-ST segment myocardial infarction treated with PROMUS drug-eluting stenting of distal right coronary artery and posterior descending (coronary) artery, August 2009.  EF 55-60%. Non-ischemic exercise Myoview; EF 50%, October 2009.    Marland Kitchen Hearing loss   . Other and unspecified hyperlipidemia   . Peripheral neuropathy   . Statin intolerance    Severe myalgias  . Type 2 diabetes mellitus ([place])     Past Surgical History:  Procedure Laterality Date  . APPENDECTOMY    . CARDIAC CATHETERIZATION  2009   stents x2  . COCHLEAR IMPLANT Left 09/30/2018   Procedure: COCHLEAR IMPLANT;  Surgeon: Leta Baptist, MD;  Location: [facility];  Service: ENT;  Laterality: Left;  . HERNIA REPAIR  80's     Current  Outpatient Medications  Medication Sig Dispense Refill  . aspirin EC 81 MG tablet Take 81 mg by mouth daily.      . Blood Glucose Monitoring Suppl (ONE TOUCH ULTRA 2) w/Device KIT Test sugars 3 times daily 1 each 0  . clopidogrel (PLAVIX) 75 MG tablet TAKE 1 TABLET BY MOUTH  DAILY 90 tablet 3  . glucose blood (ONE TOUCH TEST STRIPS) test strip Test 3 times daily   E11.9 100 each 12  . metFORMIN (GLUCOPHAGE) 500 MG tablet TAKE 1 TABLET BY MOUTH  TWICE DAILY WITH A MEAL 180 tablet 3  . ONE TOUCH LANCETS MISC 1 each by Does not apply route 2 (two) times daily. 100 each 11  . sildenafil (REVATIO) 20 MG tablet Take 2-5 pills at once, orally, with each sexual encounter 50 tablet 5   No current facility-administered medications for this visit.    Allergies:   Statins    ROS:  Please see the history of present illness.   Otherwise, review of ***.   All other systems are reviewed and negative.    PHYSICAL EXAM: VS:  There were no vitals taken for this visit. , BMI There is no height or weight on file to calculate BMI. GENERAL:  Well appearing NECK:  No jugular venous distention, waveform within normal limits, carotid upstroke brisk and symmetric, no bruits, no  thyromegaly LUNGS:  Clear to auscultation bilaterally CHEST:  Unremarkable HEART:  PMI not displaced or sustained,S1 and S2 within normal limits, no S3, no S4, no clicks, no rubs, *** murmurs ABD:  Flat, positive bowel sounds normal in frequency in pitch, no bruits, no rebound, no guarding, no midline pulsatile mass, no hepatomegaly, no splenomegaly EXT:  2 plus pulses throughout, no edema, no cyanosis no clubbing    ***GENERAL:  Well appearing NECK:  No jugular venous distention, waveform within normal limits, carotid upstroke brisk and symmetric, no bruits, no thyromegaly LUNGS:  Clear to auscultation bilaterally CHEST:  Unremarkable HEART:  PMI not displaced or sustained,S1 and S2 within normal limits, no S3, no S4, no clicks, no  rubs, no murmurs ABD:  Flat, positive bowel sounds normal in frequency in pitch, no bruits, no rebound, no guarding, no midline pulsatile mass, no hepatomegaly, no splenomegaly EXT:  2 plus pulses throughout, no edema, no cyanosis no clubbing   EKG:  EKG is *** ordered today. The ekg ordered today demonstrates sinus rhythm, rate ***, left axis deviation, left anterior fascicular block, intervals within normal limits, no acute ST-T wave changes.   Recent Labs: 03/29/2020: ALT 18; BUN 22; Creatinine, Ser 1.42; Hemoglobin 15.6; Platelets 231; Potassium 4.9; Sodium 138    Lipid Panel    Component Value Date/Time   CHOL 170 03/29/2020 0912   CHOL 159 10/29/2012 0941   TRIG 208 (H) 03/29/2020 0912   TRIG 170 (H) 10/29/2012 0941   HDL 35 (L) 03/29/2020 0912   HDL 30 (L) 10/29/2012 0941   CHOLHDL 4.9 03/29/2020 0912   CHOLHDL 5.7 12/03/2007 0100   VLDL 27 12/03/2007 0100   LDLCALC 99 03/29/2020 0912   LDLCALC 95 10/29/2012 0941     Wt Readings from Last 3 Encounters:  03/29/20 182 lb 6 oz (82.7 kg)  09/24/19 184 lb (83.5 kg)  02/25/19 175 lb (79.4 kg)      Other studies Reviewed: Additional studies/ records that were reviewed today include: *** Review of the above records demonstrates:  Please see elsewhere in the note.     ASSESSMENT AND PLAN:  CAD:  *** The patient has no new sypmtoms.  No further cardiovascular testing is indicated.  We will continue with aggressive risk reduction and meds as listed.  DM:    A1C was *** 5.8 which was down from 6.1.   No change in therapy.   HTN: His blood pressure is *** at target.  No change in therapy.   DYSLIPIDEMIA:  LDL was *** 58. .  Continue current meds.  TOBACCO:  *** He needs to stop smoking but he is only smoking a couple of cigs per month.  We talked about this.     Current medicines are reviewed at length with the patient today.  The patient does not have concerns regarding medicines.  The following changes have been  made:  no change  Labs/ tests ordered today include: None  No orders of the defined types were placed in this encounter.    Disposition:   FU with me in one year.     Signed, Minus Breeding, MD  04/25/2020 8:04 AM    [facility]

## 2020-04-27 ENCOUNTER — Ambulatory Visit: Payer: Medicare Other | Admitting: Cardiology

## 2020-04-27 DIAGNOSIS — E118 Type 2 diabetes mellitus with unspecified complications: Secondary | ICD-10-CM

## 2020-04-27 DIAGNOSIS — E785 Hyperlipidemia, unspecified: Secondary | ICD-10-CM

## 2020-04-27 DIAGNOSIS — I2510 Atherosclerotic heart disease of native coronary artery without angina pectoris: Secondary | ICD-10-CM

## 2020-04-27 DIAGNOSIS — Z720 Tobacco use: Secondary | ICD-10-CM

## 2020-04-27 DIAGNOSIS — I10 Essential (primary) hypertension: Secondary | ICD-10-CM

## 2020-04-28 DIAGNOSIS — H903 Sensorineural hearing loss, bilateral: Secondary | ICD-10-CM | POA: Diagnosis not present

## 2020-05-04 DIAGNOSIS — H903 Sensorineural hearing loss, bilateral: Secondary | ICD-10-CM | POA: Diagnosis not present

## 2020-05-11 DIAGNOSIS — H903 Sensorineural hearing loss, bilateral: Secondary | ICD-10-CM | POA: Diagnosis not present

## 2020-05-16 ENCOUNTER — Encounter: Payer: Self-pay | Admitting: Family Medicine

## 2020-05-16 DIAGNOSIS — Z1211 Encounter for screening for malignant neoplasm of colon: Secondary | ICD-10-CM

## 2020-05-17 NOTE — Telephone Encounter (Signed)
I ordered a cologuard kit for him. WS

## 2020-05-18 DIAGNOSIS — H903 Sensorineural hearing loss, bilateral: Secondary | ICD-10-CM | POA: Diagnosis not present

## 2020-05-25 DIAGNOSIS — H903 Sensorineural hearing loss, bilateral: Secondary | ICD-10-CM | POA: Diagnosis not present

## 2020-06-01 DIAGNOSIS — H903 Sensorineural hearing loss, bilateral: Secondary | ICD-10-CM | POA: Diagnosis not present

## 2020-06-07 NOTE — Progress Notes (Deleted)
Cardiology Office Note   Date:  06/07/2020   ID:  Franklin [name], DOB 05/01/1950, MRN [id number]  PCP:  Franklin Fraise, MD  Cardiologist:   Minus Breeding, MD   No chief complaint on file.     History of Present Illness: Franklin [name] is a 70 y.o. male who presents for follow up of CAD.   He returns for routine follow-up.  ***  *** He is done well since I last saw him.  He has had a cochlear implant.  Can hear very little.  He is not as active as he used to be.  He is not hunting like he used to.  However, with his usual activities he denies any cardiovascular symptoms.  The patient denies any new symptoms such as chest discomfort, neck or arm discomfort. There has been no new shortness of breath, PND or orthopnea. There have been no reported palpitations, presyncope or syncope.     Past Medical History:  Diagnosis Date  . Charcot-Marie-Tooth disease   . COPD (chronic obstructive pulmonary disease) (HCC)    has smokers cough  . Coronary artery disease 2009   MI-stents x2  . Coronary atherosclerosis of native coronary artery    History of inferior myocardial infarction, treated with   CYPHER stenting of circumflex and right coronary artery, February 2004.Status post non-ST segment myocardial infarction treated with PROMUS drug-eluting stenting of distal right coronary artery and posterior descending (coronary) artery, August 2009.  EF 55-60%. Non-ischemic exercise Myoview; EF 50%, October 2009.    Marland Kitchen Hearing loss   . Other and unspecified hyperlipidemia   . Peripheral neuropathy   . Statin intolerance    Severe myalgias  . Type 2 diabetes mellitus ([place])     Past Surgical History:  Procedure Laterality Date  . APPENDECTOMY    . CARDIAC CATHETERIZATION  2009   stents x2  . COCHLEAR IMPLANT Left 09/30/2018   Procedure: COCHLEAR IMPLANT;  Surgeon: Franklin Baptist, MD;  Location: [place];  Service: ENT;  Laterality: Left;  . HERNIA REPAIR  80's     Current  Outpatient Medications  Medication Sig Dispense Refill  . aspirin EC 81 MG tablet Take 81 mg by mouth daily.      . Blood Glucose Monitoring Suppl (ONE TOUCH ULTRA 2) w/Device KIT Test sugars 3 times daily 1 each 0  . clopidogrel (PLAVIX) 75 MG tablet TAKE 1 TABLET BY MOUTH  DAILY 90 tablet 3  . glucose blood (ONE TOUCH TEST STRIPS) test strip Test 3 times daily   E11.9 100 each 12  . metFORMIN (GLUCOPHAGE) 500 MG tablet TAKE 1 TABLET BY MOUTH  TWICE DAILY WITH A MEAL 180 tablet 3  . ONE TOUCH LANCETS MISC 1 each by Does not apply route 2 (two) times daily. 100 each 11  . sildenafil (REVATIO) 20 MG tablet Take 2-5 pills at once, orally, with each sexual encounter 50 tablet 5   No current facility-administered medications for this visit.    Allergies:   Statins    ROS:  Please see the history of present illness.   Otherwise, review of ***.   All other systems are reviewed and negative.    PHYSICAL EXAM: VS:  There were no vitals taken for this visit. , BMI There is no height or weight on file to calculate BMI. GENERAL:  Well appearing NECK:  No jugular venous distention, waveform within normal limits, carotid upstroke brisk and symmetric, no bruits, no thyromegaly  LUNGS:  Clear to auscultation bilaterally CHEST:  Unremarkable HEART:  PMI not displaced or sustained,S1 and S2 within normal limits, no S3, no S4, no clicks, no rubs, *** murmurs ABD:  Flat, positive bowel sounds normal in frequency in pitch, no bruits, no rebound, no guarding, no midline pulsatile mass, no hepatomegaly, no splenomegaly EXT:  2 plus pulses throughout, no edema, no cyanosis no clubbing    ***GENERAL:  Well appearing NECK:  No jugular venous distention, waveform within normal limits, carotid upstroke brisk and symmetric, no bruits, no thyromegaly LUNGS:  Clear to auscultation bilaterally CHEST:  Unremarkable HEART:  PMI not displaced or sustained,S1 and S2 within normal limits, no S3, no S4, no clicks, no  rubs, no murmurs ABD:  Flat, positive bowel sounds normal in frequency in pitch, no bruits, no rebound, no guarding, no midline pulsatile mass, no hepatomegaly, no splenomegaly EXT:  2 plus pulses throughout, no edema, no cyanosis no clubbing   EKG:  EKG is***  ordered today. The ekg ordered today demonstrates sinus rhythm, rate ***, left axis deviation, left anterior fascicular block, intervals within normal limits, no acute ST-T wave changes.   Recent Labs: 03/29/2020: ALT 18; BUN 22; Creatinine, Ser 1.42; Hemoglobin 15.6; Platelets 231; Potassium 4.9; Sodium 138    Lipid Panel    Component Value Date/Time   CHOL 170 03/29/2020 0912   CHOL 159 10/29/2012 0941   TRIG 208 (H) 03/29/2020 0912   TRIG 170 (H) 10/29/2012 0941   HDL 35 (L) 03/29/2020 0912   HDL 30 (L) 10/29/2012 0941   CHOLHDL 4.9 03/29/2020 0912   CHOLHDL 5.7 12/03/2007 0100   VLDL 27 12/03/2007 0100   LDLCALC 99 03/29/2020 0912   LDLCALC 95 10/29/2012 0941     Wt Readings from Last 3 Encounters:  03/29/20 182 lb 6 oz (82.7 kg)  09/24/19 184 lb (83.5 kg)  02/25/19 175 lb (79.4 kg)      Other studies Reviewed: Additional studies/ records that were reviewed today include: *** Review of the above records demonstrates:  Please see elsewhere in the note.     ASSESSMENT AND PLAN:  CAD:   *** The patient has no new sypmtoms.  No further cardiovascular testing is indicated.  We will continue with aggressive risk reduction and meds as listed.  DM:    A1C was *** 5.8 which was down from 6.1.   No change in therapy.   HTN:   ***  His blood pressure is at target.  No change in therapy.   DYSLIPIDEMIA:  LDL was *** 58. .  Continue current meds.  TOBACCO:   ***  He needs to stop smoking but he is only smoking a couple of cigs per month.  We talked about this.     Current medicines are reviewed at length with the patient today.  The patient does not have concerns regarding medicines.  The following changes have  been made:  no change  Labs/ tests ordered today include: None  No orders of the defined types were placed in this encounter.    Disposition:   FU with me in one year.     Signed, Minus Breeding, MD  06/07/2020 8:19 PM    [place]

## 2020-06-08 ENCOUNTER — Ambulatory Visit: Payer: Medicare Other

## 2020-06-08 ENCOUNTER — Ambulatory Visit: Payer: Medicare Other | Admitting: Cardiology

## 2020-06-08 ENCOUNTER — Ambulatory Visit (INDEPENDENT_AMBULATORY_CARE_PROVIDER_SITE_OTHER): Payer: Medicare Other | Admitting: Family Medicine

## 2020-06-08 ENCOUNTER — Encounter: Payer: Self-pay | Admitting: Family Medicine

## 2020-06-08 DIAGNOSIS — I10 Essential (primary) hypertension: Secondary | ICD-10-CM

## 2020-06-08 DIAGNOSIS — J029 Acute pharyngitis, unspecified: Secondary | ICD-10-CM

## 2020-06-08 DIAGNOSIS — J069 Acute upper respiratory infection, unspecified: Secondary | ICD-10-CM

## 2020-06-08 DIAGNOSIS — I2510 Atherosclerotic heart disease of native coronary artery without angina pectoris: Secondary | ICD-10-CM

## 2020-06-08 DIAGNOSIS — E118 Type 2 diabetes mellitus with unspecified complications: Secondary | ICD-10-CM

## 2020-06-08 NOTE — Progress Notes (Signed)
   Virtual Visit via telephone Note Due to COVID-19 pandemic this visit was conducted virtually. This visit type was conducted due to national recommendations for restrictions regarding the COVID-19 Pandemic (e.g. social distancing, sheltering in place) in an effort to limit this patient's exposure and mitigate transmission in our community. All issues noted in this document were discussed and addressed.  A physical exam was not performed with this format.  I connected with Franklin [name] on 06/08/20 at 1131 by telephone and verified that I am speaking with the correct person using two identifiers. Franklin [name] is currently located at home and his wife is currently with him during the visit. The provider, Gabriel Earing, FNP is located in their office at time of visit.  I discussed the limitations, risks, security and privacy concerns of performing an evaluation and management service by telephone and the availability of in person appointments. I also discussed with the patient that there may be a patient responsible charge related to this service. The patient expressed understanding and agreed to proceed.  CC: sore throat  History and Present Illness:  HPI  History is provided by Franklin [name]'s wife as Franklin [name] is deaf. Trigg reports a sore throat x2 days. His throat is a little red but his wife did not see any exudate. He also reports congestion, cough, and fatigue x1 day. Denies fever, body aches, chills, fatigue, nausea, vomiting, or diarrhea. He has tried OTC allergy medication for his symptoms without relief. He had a negative home Covid test this morning.    ROS As per HPI.   Observations/Objective: Alert and oriented per wife's report.   Assessment and Plan: Franklin [name] was seen today for sore throat.  Diagnoses and all orders for this visit:  Sore throat With cough and congestion. No fever. Discussed that symptoms are most likely viral. Symptomatic care at home. Return to office for new or  worsening symptoms, or if symptoms persist.   URI with cough and congestion Negative home Covid test. Declined flu and PCR covid testing today. Discussed symptomatic care at home. Return to office for new or worsening symptoms, or if symptoms persist.    Follow Up Instructions: As needed.     I discussed the assessment and treatment plan with the patient. The patient was provided an opportunity to ask questions and all were answered. The patient agreed with the plan and demonstrated an understanding of the instructions.   The patient was advised to call back or seek an in-person evaluation if the symptoms worsen or if the condition fails to improve as anticipated.  The above assessment and management plan was discussed with the patient. The patient verbalized understanding of and has agreed to the management plan. Patient is aware to call the clinic if symptoms persist or worsen. Patient is aware when to return to the clinic for a follow-up visit. Patient educated on when it is appropriate to go to the emergency department.   Time call ended:  1138  I provided 7 minutes of phone time during this encounter.   Gabriel Earing, FNP

## 2020-06-29 DIAGNOSIS — H903 Sensorineural hearing loss, bilateral: Secondary | ICD-10-CM | POA: Diagnosis not present

## 2020-07-06 DIAGNOSIS — H903 Sensorineural hearing loss, bilateral: Secondary | ICD-10-CM | POA: Diagnosis not present

## 2020-07-13 DIAGNOSIS — H903 Sensorineural hearing loss, bilateral: Secondary | ICD-10-CM | POA: Diagnosis not present

## 2020-07-28 DIAGNOSIS — H903 Sensorineural hearing loss, bilateral: Secondary | ICD-10-CM | POA: Diagnosis not present

## 2020-08-03 DIAGNOSIS — H903 Sensorineural hearing loss, bilateral: Secondary | ICD-10-CM | POA: Diagnosis not present

## 2020-08-10 DIAGNOSIS — H903 Sensorineural hearing loss, bilateral: Secondary | ICD-10-CM | POA: Diagnosis not present

## 2020-08-16 NOTE — Progress Notes (Signed)
Cardiology Office Note   Date:  08/17/2020   ID:  Franklin [name], DOB Feb 07, 1951, MRN [id number]  PCP:  Claretta Fraise, MD  Cardiologist:   Minus Breeding, MD   Chief Complaint  Patient presents with  . Coronary Artery Disease      History of Present Illness: Franklin [name] is a 70 y.o. male who presents for follow up of CAD.   He returns for routine follow-up.  Since I last saw him he has done well.  He stays active.  He denies any cardiovascular symptoms. The patient denies any new symptoms such as chest discomfort, neck or arm discomfort. There has been no new shortness of breath, PND or orthopnea. There have been no reported palpitations, presyncope or syncope.    Past Medical History:  Diagnosis Date  . Charcot-Marie-Tooth disease   . COPD (chronic obstructive pulmonary disease) (HCC)    has smokers cough  . Coronary artery disease 2009   MI-stents x2  . Coronary atherosclerosis of native coronary artery    History of inferior myocardial infarction, treated with   CYPHER stenting of circumflex and right coronary artery, February 2004.Status post non-ST segment myocardial infarction treated with PROMUS drug-eluting stenting of distal right coronary artery and posterior descending (coronary) artery, August 2009.  EF 55-60%. Non-ischemic exercise Myoview; EF 50%, October 2009.    Marland Kitchen Hearing loss   . Other and unspecified hyperlipidemia   . Peripheral neuropathy   . Statin intolerance    Severe myalgias  . Type 2 diabetes mellitus ([place])     Past Surgical History:  Procedure Laterality Date  . APPENDECTOMY    . CARDIAC CATHETERIZATION  2009   stents x2  . COCHLEAR IMPLANT Left 09/30/2018   Procedure: COCHLEAR IMPLANT;  Surgeon: Leta Baptist, MD;  Location: [place];  Service: ENT;  Laterality: Left;  . HERNIA REPAIR  80's     Current Outpatient Medications  Medication Sig Dispense Refill  . aspirin EC 81 MG tablet Take 81 mg by mouth daily.    .  clopidogrel (PLAVIX) 75 MG tablet TAKE 1 TABLET BY MOUTH  DAILY 90 tablet 3  . metFORMIN (GLUCOPHAGE) 500 MG tablet TAKE 1 TABLET BY MOUTH  TWICE DAILY WITH A MEAL 180 tablet 3  . sildenafil (REVATIO) 20 MG tablet Take 2-5 pills at once, orally, with each sexual encounter 50 tablet 5  . Blood Glucose Monitoring Suppl (ONE TOUCH ULTRA 2) w/Device KIT Test sugars 3 times daily 1 each 0  . glucose blood (ONE TOUCH TEST STRIPS) test strip Test 3 times daily   E11.9 100 each 12  . ONE TOUCH LANCETS MISC 1 each by Does not apply route 2 (two) times daily. 100 each 11   No current facility-administered medications for this visit.    Allergies:   Statins    ROS:  Please see the history of present illness.   Otherwise, review of none.   All other systems are reviewed and negative.    PHYSICAL EXAM: VS:  BP 120/78   Pulse 78   Ht $R'6\' 4"'Oq$  (1.93 m)   Wt 180 lb (81.6 kg)   BMI 21.91 kg/m  , BMI Body mass index is 21.91 kg/m. GENERAL:  Well appearing NECK:  No jugular venous distention, waveform within normal limits, carotid upstroke brisk and symmetric, no bruits, no thyromegaly LUNGS:  Clear to auscultation bilaterally CHEST:  Unremarkable HEART:  PMI not displaced or sustained,S1 and S2 within normal  limits, no S3, no S4, no clicks, no rubs, no murmurs ABD:  Flat, positive bowel sounds normal in frequency in pitch, no bruits, no rebound, no guarding, no midline pulsatile mass, no hepatomegaly, no splenomegaly EXT:  2 plus pulses throughout, no edema, no cyanosis no clubbing     EKG:  EKG is  ordered today. The ekg ordered today demonstrates sinus rhythm, rate 78, left axis deviation, left anterior fascicular block, intervals within normal limits, no acute ST-T wave changes.   Recent Labs: 03/29/2020: ALT 18; BUN 22; Creatinine, Ser 1.42; Hemoglobin 15.6; Platelets 231; Potassium 4.9; Sodium 138    Lipid Panel    Component Value Date/Time   CHOL 170 03/29/2020 0912   CHOL 159  10/29/2012 0941   TRIG 208 (H) 03/29/2020 0912   TRIG 170 (H) 10/29/2012 0941   HDL 35 (L) 03/29/2020 0912   HDL 30 (L) 10/29/2012 0941   CHOLHDL 4.9 03/29/2020 0912   CHOLHDL 5.7 12/03/2007 0100   VLDL 27 12/03/2007 0100   LDLCALC 99 03/29/2020 0912   LDLCALC 95 10/29/2012 0941     Wt Readings from Last 3 Encounters:  08/17/20 180 lb (81.6 kg)  03/29/20 182 lb 6 oz (82.7 kg)  09/24/19 184 lb (83.5 kg)      Other studies Reviewed: Additional studies/ records that were reviewed today include: Labs Review of the above records demonstrates:  Please see elsewhere in the note.     ASSESSMENT AND PLAN:  CAD:   The patient has no new sypmtoms.  No further cardiovascular testing is indicated.  We will continue with aggressive risk reduction and meds as listed.  DM:    A1C was 6.1.  No change in therapy.   HTN: His blood pressure is at target.  No change in therapy   DYSLIPIDEMIA:  LDL was 99.  He has not tolerated statins.  We talked about more of a plant-based diet.    TOBACCO:   He has not wanted to quit smoking completely.  He smokes infrequently.     Current medicines are reviewed at length with the patient today.  The patient does not have concerns regarding medicines.  The following changes have been made:  no change  Labs/ tests ordered today include: None  Orders Placed This Encounter  Procedures  . EKG 12-Lead     Disposition:   FU with me in one year.     Signed, Minus Breeding, MD  08/17/2020 1:13 PM    [place]

## 2020-08-17 ENCOUNTER — Encounter: Payer: Self-pay | Admitting: Cardiology

## 2020-08-17 ENCOUNTER — Ambulatory Visit: Payer: Medicare Other | Admitting: Cardiology

## 2020-08-17 ENCOUNTER — Other Ambulatory Visit: Payer: Self-pay

## 2020-08-17 VITALS — BP 120/78 | HR 78 | Ht 76.0 in | Wt 180.0 lb

## 2020-08-17 DIAGNOSIS — E118 Type 2 diabetes mellitus with unspecified complications: Secondary | ICD-10-CM

## 2020-08-17 DIAGNOSIS — Z720 Tobacco use: Secondary | ICD-10-CM | POA: Diagnosis not present

## 2020-08-17 DIAGNOSIS — E785 Hyperlipidemia, unspecified: Secondary | ICD-10-CM | POA: Diagnosis not present

## 2020-08-17 DIAGNOSIS — I2510 Atherosclerotic heart disease of native coronary artery without angina pectoris: Secondary | ICD-10-CM

## 2020-08-17 NOTE — Patient Instructions (Signed)
Medication Instructions:  The current medical regimen is effective;  continue present plan and medications.  *If you need a refill on your cardiac medications before your next appointment, please call your pharmacy*  Follow-Up: At [facility], you and your health needs are our priority.  As part of our continuing mission to provide you with exceptional heart care, we have created designated Provider Care Teams.  These Care Teams include your primary Cardiologist (physician) and Advanced Practice Providers (APPs -  Physician Assistants and Nurse Practitioners) who all work together to provide you with the care you need, when you need it.  We recommend signing up for the patient portal called "MyChart".  Sign up information is provided on this After Visit Summary.  MyChart is used to connect with patients for Virtual Visits (Telemedicine).  Patients are able to view lab/test results, encounter notes, upcoming appointments, etc.  Non-urgent messages can be sent to your provider as well.   To learn more about what you can do with MyChart, go to [email or web address].    Your next appointment:   12 month(s)  The format for your next appointment:   In Person  Provider:   James Hochrein, MD   Thank you for choosing [facility]!!     

## 2020-08-23 DIAGNOSIS — H903 Sensorineural hearing loss, bilateral: Secondary | ICD-10-CM | POA: Diagnosis not present

## 2020-08-24 DIAGNOSIS — H903 Sensorineural hearing loss, bilateral: Secondary | ICD-10-CM | POA: Diagnosis not present

## 2020-08-31 DIAGNOSIS — H903 Sensorineural hearing loss, bilateral: Secondary | ICD-10-CM | POA: Diagnosis not present

## 2020-09-07 DIAGNOSIS — H903 Sensorineural hearing loss, bilateral: Secondary | ICD-10-CM | POA: Diagnosis not present

## 2020-09-14 DIAGNOSIS — H903 Sensorineural hearing loss, bilateral: Secondary | ICD-10-CM | POA: Diagnosis not present

## 2020-09-27 ENCOUNTER — Other Ambulatory Visit: Payer: Self-pay

## 2020-09-27 ENCOUNTER — Ambulatory Visit (INDEPENDENT_AMBULATORY_CARE_PROVIDER_SITE_OTHER): Payer: Medicare Other | Admitting: Family Medicine

## 2020-09-27 ENCOUNTER — Encounter: Payer: Self-pay | Admitting: Family Medicine

## 2020-09-27 VITALS — BP 127/76 | HR 80 | Temp 98.1°F | Ht 76.0 in

## 2020-09-27 DIAGNOSIS — Z9621 Cochlear implant status: Secondary | ICD-10-CM

## 2020-09-27 DIAGNOSIS — E1140 Type 2 diabetes mellitus with diabetic neuropathy, unspecified: Secondary | ICD-10-CM

## 2020-09-27 DIAGNOSIS — I10 Essential (primary) hypertension: Secondary | ICD-10-CM

## 2020-09-27 DIAGNOSIS — R351 Nocturia: Secondary | ICD-10-CM

## 2020-09-27 DIAGNOSIS — N401 Enlarged prostate with lower urinary tract symptoms: Secondary | ICD-10-CM | POA: Diagnosis not present

## 2020-09-27 DIAGNOSIS — Z125 Encounter for screening for malignant neoplasm of prostate: Secondary | ICD-10-CM

## 2020-09-27 LAB — BAYER DCA HB A1C WAIVED: HB A1C (BAYER DCA - WAIVED): 5.8 % (ref <7.0)

## 2020-09-27 MED ORDER — SILDENAFIL CITRATE 20 MG PO TABS: ORAL_TABLET | ORAL | 5 refills | Status: DC

## 2020-09-27 MED ORDER — CLOPIDOGREL BISULFATE 75 MG PO TABS: 1.0000 | ORAL_TABLET | Freq: Every day | ORAL | 3 refills | Status: DC

## 2020-09-27 MED ORDER — METFORMIN HCL 500 MG PO TABS: 1.0000 | ORAL_TABLET | Freq: Two times a day (BID) | ORAL | 3 refills | Status: DC

## 2020-09-27 NOTE — Progress Notes (Signed)
Subjective:  Patient ID: Franklin [name],  male    DOB: Jun 22, 1950  Age: 70 y.o.    CC: Medical Management of Chronic Issues   HPI Franklin [name] presents for  follow-up of hypertension. Patient has no history of headache chest pain or shortness of breath or recent cough. Patient also denies symptoms of TIA such as numbness weakness lateralizing. Patient denies side effects from medication. States taking it regularly.  Patient also  in for follow-up of elevated cholesterol. Doing well without complaints on current medication. Denies side effects  including myalgia and arthralgia and nausea. Also in today for liver function testing. Currently no chest pain, shortness of breath or other cardiovascular related symptoms noted.  Follow-up of diabetes. Patient does check blood sugar at home occasionally. Readings run between 120 and 170-180 Patient denies symptoms such as excessive hunger or urinary frequency, excessive hunger, nausea No significant hypoglycemic spells noted. Medications reviewed. Pt reports taking them regularly. Pt. denies complication/adverse reaction today.    History Franklin [name] has a past medical history of Charcot-Marie-Tooth disease, COPD (chronic obstructive pulmonary disease) ([place]), Coronary artery disease (2009), Coronary atherosclerosis of native coronary artery, Hearing loss, Other and unspecified hyperlipidemia, Peripheral neuropathy, Statin intolerance, and Type 2 diabetes mellitus ([place]).   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   His family history includes Alzheimer's disease in his mother; Charcot-Marie-Tooth disease in his daughter and sister; Hearing loss in his son; Stroke in his son.He reports that he has quit smoking. His smoking use included cigarettes. He started smoking about 54 years ago. He has a 40.00 pack-year smoking history. He uses  smokeless tobacco. He reports that he does not drink alcohol and does not use drugs.  Current Outpatient Medications on File Prior to Visit  Medication Sig Dispense Refill  . aspirin EC 81 MG tablet Take 81 mg by mouth daily.    . Blood Glucose Monitoring Suppl (ONE TOUCH ULTRA 2) w/Device KIT Test sugars 3 times daily 1 each 0  . glucose blood (ONE TOUCH TEST STRIPS) test strip Test 3 times daily   E11.9 100 each 12  . ONE TOUCH LANCETS MISC 1 each by Does not apply route 2 (two) times daily. 100 each 11   No current facility-administered medications on file prior to visit.    ROS Review of Systems  Constitutional: Negative.   HENT: Positive for hearing loss (has cochlear implant).   Eyes: Negative for visual disturbance.  Respiratory: Negative for cough and shortness of breath.   Cardiovascular: Negative for chest pain and leg swelling.  Gastrointestinal: Negative for abdominal pain, diarrhea, nausea and vomiting.  Genitourinary: Negative for difficulty urinating.  Musculoskeletal: Negative for arthralgias and myalgias.  Skin: Negative for rash.  Neurological: Negative for headaches.  Psychiatric/Behavioral: Negative for sleep disturbance.    Objective:  BP 127/76   Pulse 80   Temp 98.1 F (36.7 C)   Ht _0  (1.93 m)   SpO2 97%   BMI 21.91 kg/m   BP Readings from Last 3 Encounters:  09/27/20 127/76  08/17/20 120/78  03/29/20 129/73    Wt Readings from Last 3 Encounters:  08/17/20 180 lb (81.6 kg)  03/29/20 182 lb 6 oz (82.7 kg)  09/24/19 184 lb (83.5 kg)     Physical Exam Vitals reviewed.  Constitutional:      Appearance: He is well-developed.  HENT:     Head: Normocephalic  and atraumatic.     Right Ear: Tympanic membrane and external ear normal. No decreased hearing noted.     Left Ear: Tympanic membrane and external ear normal. No decreased hearing noted.     Mouth/Throat:     Pharynx: No oropharyngeal exudate or posterior oropharyngeal erythema.   Eyes:     Pupils: Pupils are equal, round, and reactive to light.  Cardiovascular:     Rate and Rhythm: Normal rate and regular rhythm.     Heart sounds: No murmur heard.   Pulmonary:     Effort: No respiratory distress.     Breath sounds: Normal breath sounds.  Abdominal:     General: Bowel sounds are normal.     Palpations: Abdomen is soft. There is no mass.     Tenderness: There is no abdominal tenderness.  Musculoskeletal:     Cervical back: Normal range of motion and neck supple.  Neurological:     Mental Status: He is alert.     Diabetic Foot Exam - Simple   Simple Foot Form Diabetic Foot exam was performed with the following findings: Yes 09/27/2020  9:00 AM  Visual Inspection No deformities, no ulcerations, no other skin breakdown bilaterally: Yes Sensation Testing Intact to touch and monofilament testing bilaterally: Yes Pulse Check Posterior Tibialis and Dorsalis pulse intact bilaterally: Yes Comments       Assessment & Plan:   Franklin [name] was seen today for medical management of chronic issues.  Diagnoses and all orders for this visit:  BPH associated with nocturia -     PSA, total and free  Cochlear implant in place -     CBC with Differential/Platelet -     CMP14+EGFR -     Lipid panel -     Bayer DCA Hb A1c Waived  Essential hypertension -     CBC with Differential/Platelet -     CMP14+EGFR -     Lipid panel -     Bayer DCA Hb A1c Waived  Type 2 diabetes mellitus with diabetic neuropathy, without long-term current use of insulin (HCC) -     CBC with Differential/Platelet -     CMP14+EGFR -     Lipid panel -     Bayer DCA Hb A1c Waived  Screening for prostate cancer -     PSA, total and free  Other orders -     clopidogrel (PLAVIX) 75 MG tablet; Take 1 tablet (75 mg total) by mouth daily. -     metFORMIN (GLUCOPHAGE) 500 MG tablet; Take 1 tablet (500 mg total) by mouth 2 (two) times daily with a meal. -     sildenafil (REVATIO) 20 MG tablet;  Take 2-5 pills at once, orally, with each sexual encounter   I have changed Franklin [name]'s clopidogrel and metFORMIN. I am also having him maintain his aspirin EC, ONE TOUCH LANCETS, glucose blood, ONE TOUCH ULTRA 2, and sildenafil.  Meds ordered this encounter  Medications  . clopidogrel (PLAVIX) 75 MG tablet    Sig: Take 1 tablet (75 mg total) by mouth daily.    Dispense:  90 tablet    Refill:  3    Requesting 1 year supply  . metFORMIN (GLUCOPHAGE) 500 MG tablet    Sig: Take 1 tablet (500 mg total) by mouth 2 (two) times daily with a meal.    Dispense:  180 tablet    Refill:  3    Requesting 1 year supply  . sildenafil (REVATIO)  20 MG tablet    Sig: Take 2-5 pills at once, orally, with each sexual encounter    Dispense:  50 tablet    Refill:  5     Follow-up: Return in about 3 months (around 12/28/2020).  Claretta Fraise, M.D.

## 2020-09-28 ENCOUNTER — Encounter: Payer: Self-pay | Admitting: Family Medicine

## 2020-09-28 LAB — CBC WITH DIFFERENTIAL/PLATELET
Basophils Absolute: 0 10*3/uL (ref 0.0–0.2)
Basos: 0 %
EOS (ABSOLUTE): 0.1 10*3/uL (ref 0.0–0.4)
Eos: 1 %
Hematocrit: 45.1 % (ref 37.5–51.0)
Hemoglobin: 15.4 g/dL (ref 13.0–17.7)
Immature Grans (Abs): 0 10*3/uL (ref 0.0–0.1)
Immature Granulocytes: 0 %
Lymphocytes Absolute: 1.8 10*3/uL (ref 0.7–3.1)
Lymphs: 27 %
MCH: 30.6 pg (ref 26.6–33.0)
MCHC: 34.1 g/dL (ref 31.5–35.7)
MCV: 90 fL (ref 79–97)
Monocytes Absolute: 0.7 10*3/uL (ref 0.1–0.9)
Monocytes: 10 %
Neutrophils Absolute: 4.2 10*3/uL (ref 1.4–7.0)
Neutrophils: 62 %
Platelets: 230 10*3/uL (ref 150–450)
RBC: 5.03 x10E6/uL (ref 4.14–5.80)
RDW: 12.3 % (ref 11.6–15.4)
WBC: 6.8 10*3/uL (ref 3.4–10.8)

## 2020-09-28 LAB — CMP14+EGFR
ALT: 26 IU/L (ref 0–44)
AST: 21 IU/L (ref 0–40)
Albumin/Globulin Ratio: 2.3 — ABNORMAL HIGH (ref 1.2–2.2)
Albumin: 5.2 g/dL — ABNORMAL HIGH (ref 3.8–4.8)
Alkaline Phosphatase: 58 IU/L (ref 44–121)
BUN/Creatinine Ratio: 16 (ref 10–24)
BUN: 20 mg/dL (ref 8–27)
Bilirubin Total: 0.7 mg/dL (ref 0.0–1.2)
CO2: 23 mmol/L (ref 20–29)
Calcium: 10.3 mg/dL — ABNORMAL HIGH (ref 8.6–10.2)
Chloride: 98 mmol/L (ref 96–106)
Creatinine, Ser: 1.26 mg/dL (ref 0.76–1.27)
Globulin, Total: 2.3 g/dL (ref 1.5–4.5)
Glucose: 125 mg/dL — ABNORMAL HIGH (ref 65–99)
Potassium: 4.4 mmol/L (ref 3.5–5.2)
Sodium: 137 mmol/L (ref 134–144)
Total Protein: 7.5 g/dL (ref 6.0–8.5)
eGFR: 61 mL/min/{1.73_m2} (ref >59)

## 2020-09-28 LAB — PSA, TOTAL AND FREE
PSA, Free Pct: 43.3 %
PSA, Free: 0.52 ng/mL
Prostate Specific Ag, Serum: 1.2 ng/mL (ref 0.0–4.0)

## 2020-09-28 LAB — LIPID PANEL
Chol/HDL Ratio: 4.4 ratio (ref 0.0–5.0)
Cholesterol, Total: 155 mg/dL (ref 100–199)
HDL: 35 mg/dL — ABNORMAL LOW (ref >39)
LDL Chol Calc (NIH): 91 mg/dL (ref 0–99)
Triglycerides: 168 mg/dL — ABNORMAL HIGH (ref 0–149)
VLDL Cholesterol Cal: 29 mg/dL (ref 5–40)

## 2020-09-28 NOTE — Progress Notes (Signed)
Hello Devere,  Your lab result is normal and/or stable.Some minor variations that are not significant are commonly marked abnormal, but do not represent any medical problem for you.  Best regards, Nazariah [place], M.D.

## 2020-11-04 IMAGING — CT CT TEMPORAL BONES WITHOUT CONTRAST
2 series · 14 of 40 positions shown, 17 images · non-contrast
Comparison: None.

CLINICAL DATA: Hereditary deafness. Tinnitus.

EXAM:
CT TEMPORAL BONES WITHOUT CONTRAST
TECHNIQUE: Axial and coronal plane CT imaging of the petrous temporal bones was
performed with thin-collimation image reconstruction. No intravenous
contrast was administered. Multiplanar CT image reconstructions were
also generated.

[Series 3: ax soft · axial · 0.41mm/px · z∈[+5,+93]mm · 11 of 52 slices shown, 14 images]
[im 4/52  brain]
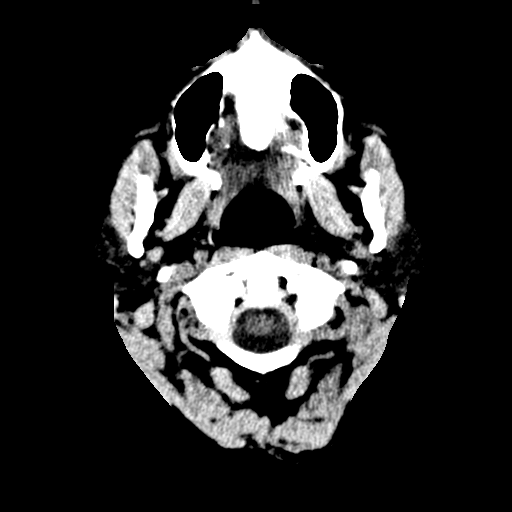
[im 4/52  bone]
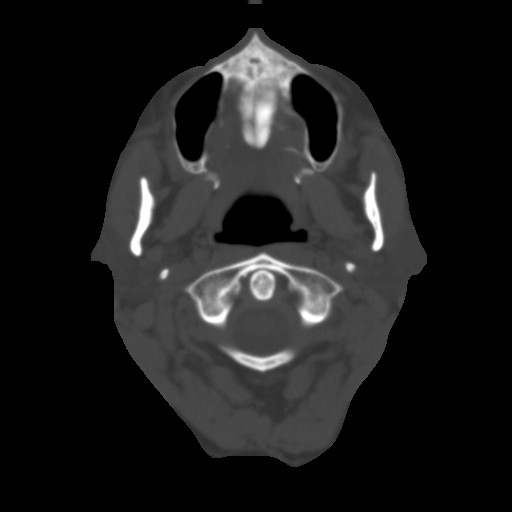
[im 8/52  bone]
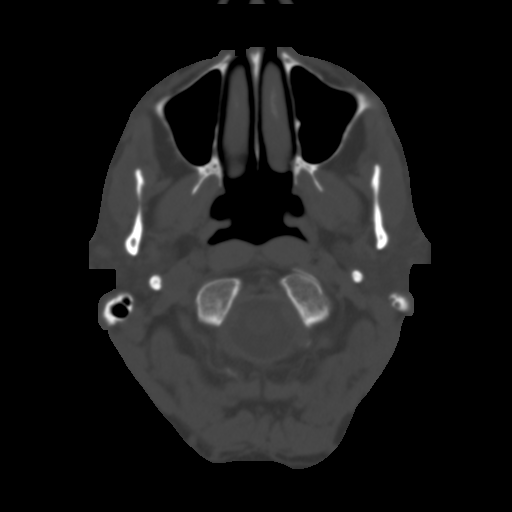
[im 13/52  bone]
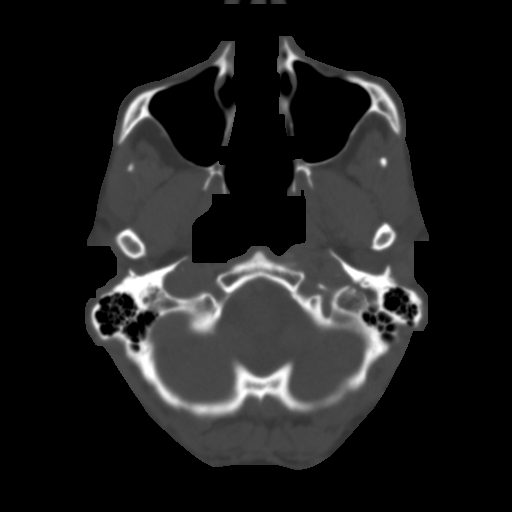
[im 16/52  bone]
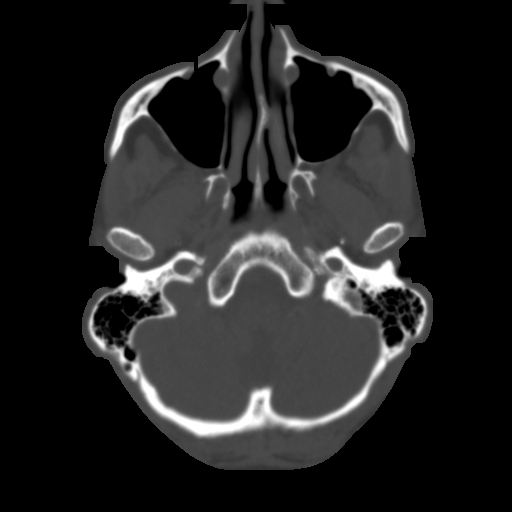
[im 22/52  brain]
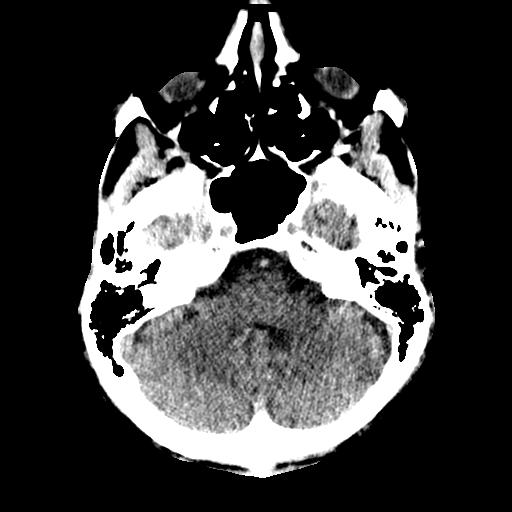
[im 22/52  bone]
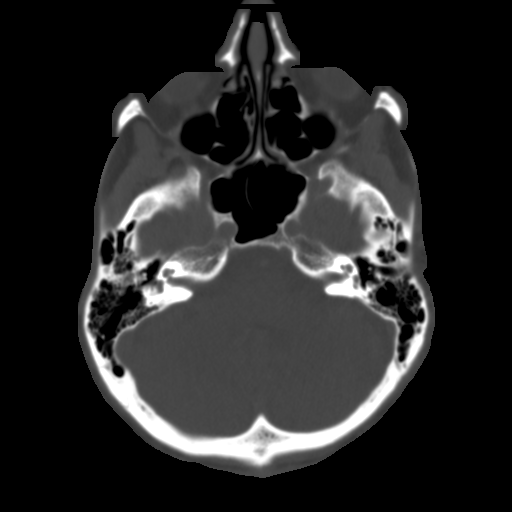
[im 27/52  bone]
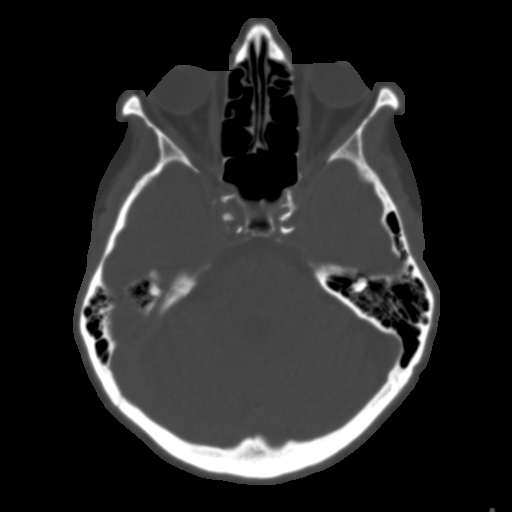
[im 30/52  bone]
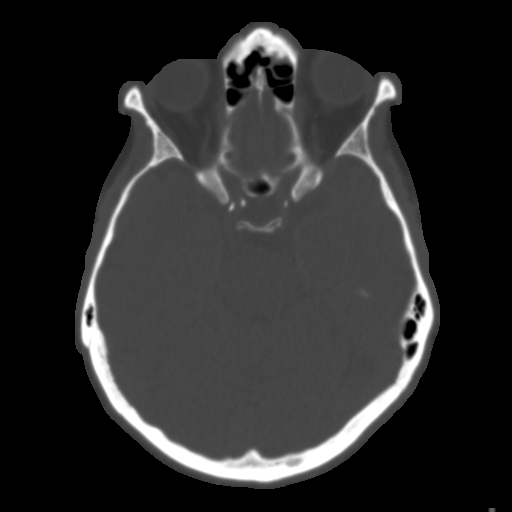
[im 36/52  bone]
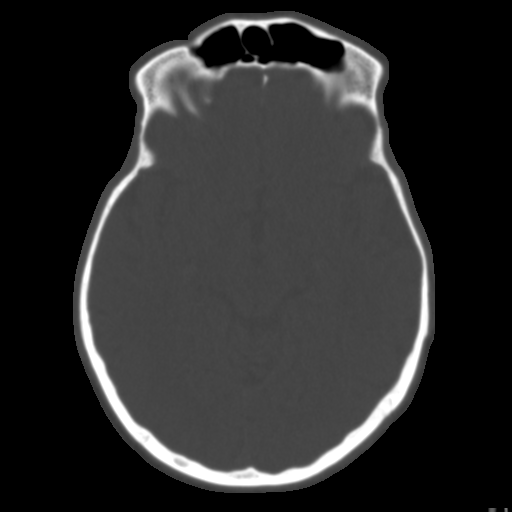
[im 39/52  brain]
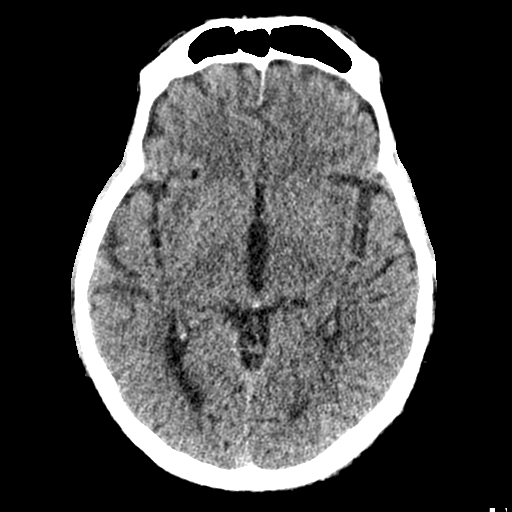
[im 39/52  bone]
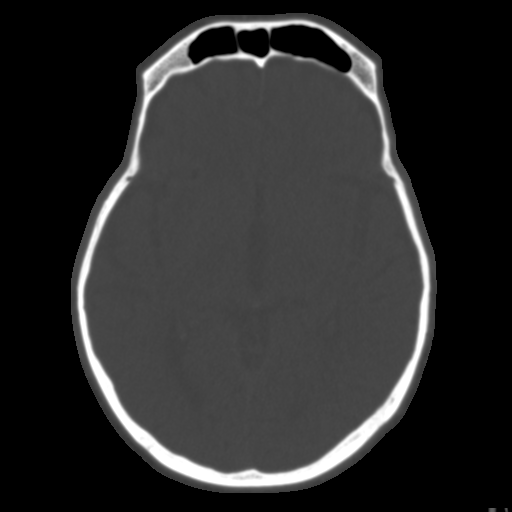
[im 44/52  bone]
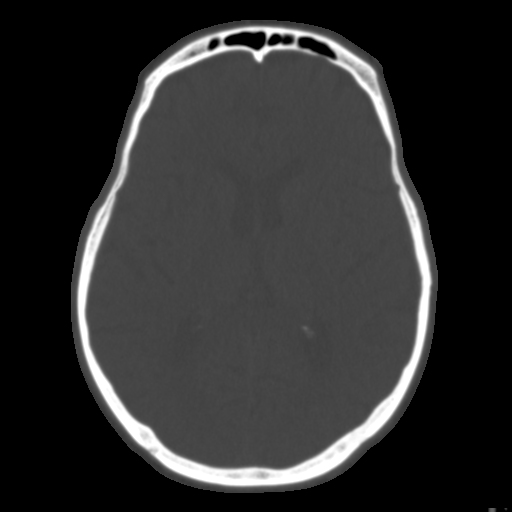
[im 48/52  bone]
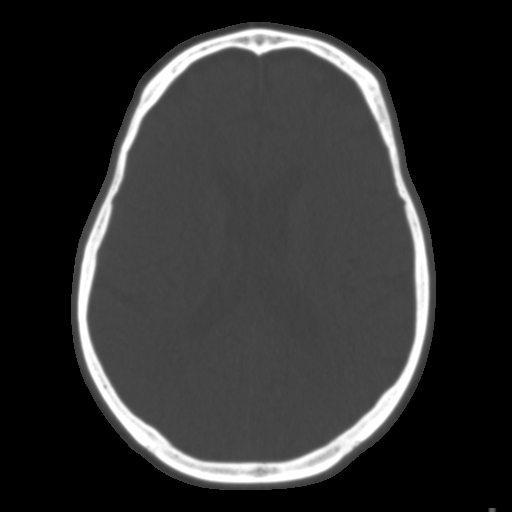

[Series 9: coronal st · coronal · 0.21mm/px · 3 of 102 slices shown]
[im 34/102  bone]
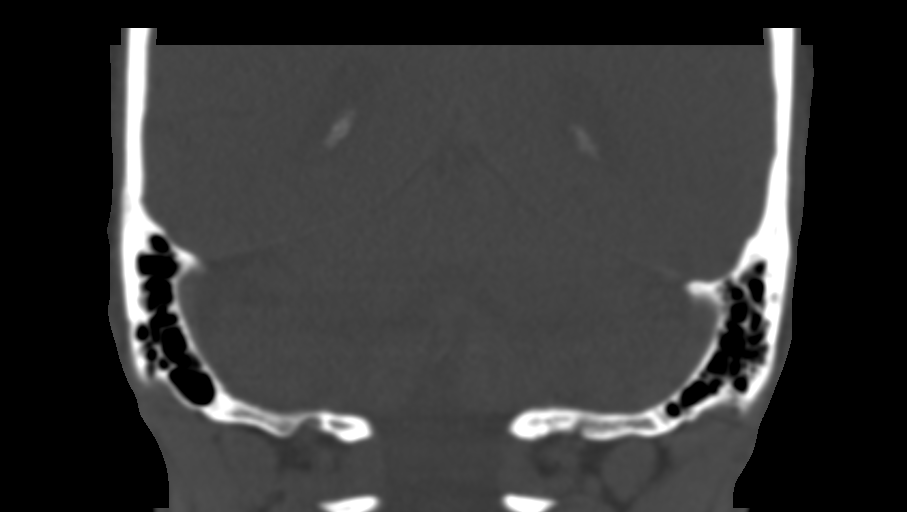
[im 45/102  bone]
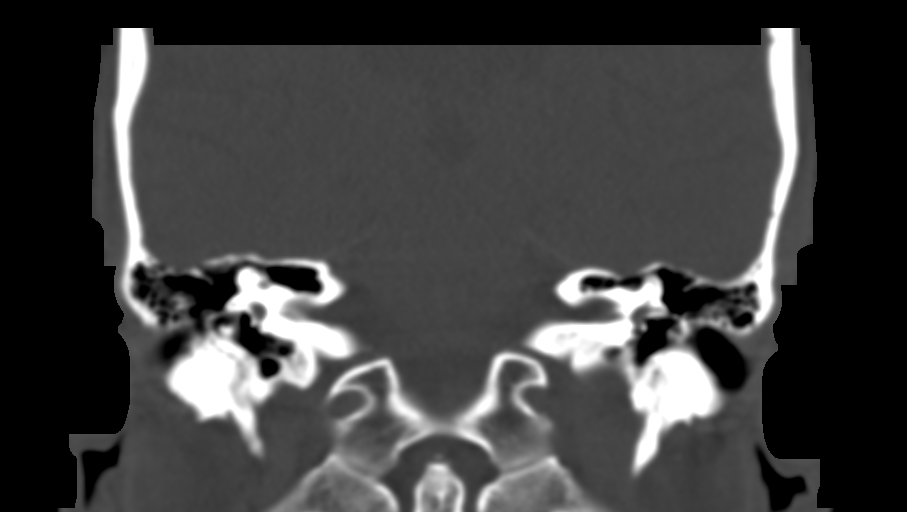
[im 57/102  bone]
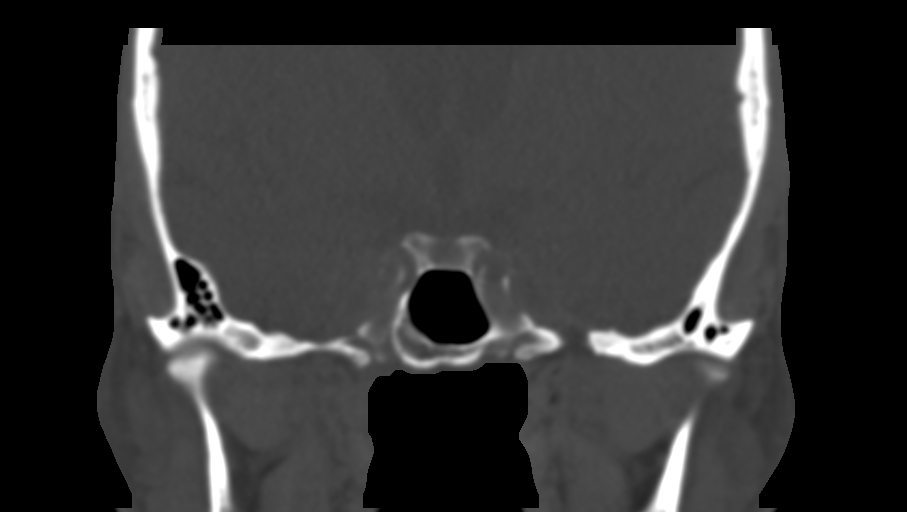

[14 of 40 positions shown; findings below may reference images not displayed]

FINDINGS: RIGHT:

--Pinna and external auditory canal: Normal.

--Ossicular chain: Normal. No erosion or dislocation.

--Tympanic membrane: Normal.

--Middle ear: Normal.

--Epitympanum: The Prussak space is clear. The scutum is sharp.
Tegmen tympani is intact.

--Cochlea, vestibule, vestibular aqueduct and semicircular canals:
Normal. No evidence of canal dehiscence or otospongiosis.

--Internal auditory canal: Normal. No widening of the porus
acusticus.

--Facial nerve: No focal abnormality along the course of the facial
nerve.

--Cerebellopontine angle: Normal.

--Petrous Apex: Normal.

--Mastoids: Normal.

--Carotid canal: Normal position.

LEFT:

--Pinna and external auditory canal: Normal.

--Ossicular chain: Normal. No erosion or dislocation.

--Tympanic membrane: Normal.

--Middle ear: Normal.

--Epitympanum: The Prussak space is clear. The scutum is sharp.
Tegmen tympani is intact.

--Cochlea, vestibule, vestibular aqueduct and semicircular canals:
Normal. No evidence of canal dehiscence or otospongiosis.

--Internal auditory canal: Normal. No widening of the porus
acusticus.

--Facial nerve: No focal abnormality along the course of the facial
nerve.

--Cerebellopontine angle: Normal.

--Petrous Apex: Normal.

--Mastoids: Normal.

--Carotid canal: Normal position.

OTHER:

--Visualized intracranial: Normal.

--Visualized paranasal sinuses: Normal.

--Nasopharynx: Clear.

--Temporomandibular joints: Normal.

--Visualized extracranial soft tissues: Normal.
IMPRESSION: Normal CT of the temporal bones.

## 2021-01-31 IMAGING — RF SELLA TURCICA
1 series · 1 of 1 positions shown · non-contrast
Comparison: None.

CLINICAL DATA: LEFT ear cochlear implant.

EXAM:
SELLA TURCICA; DG C-ARM 61-120 MIN

[Series 1: run · 1 of 1 slices shown]
[im 1/1]
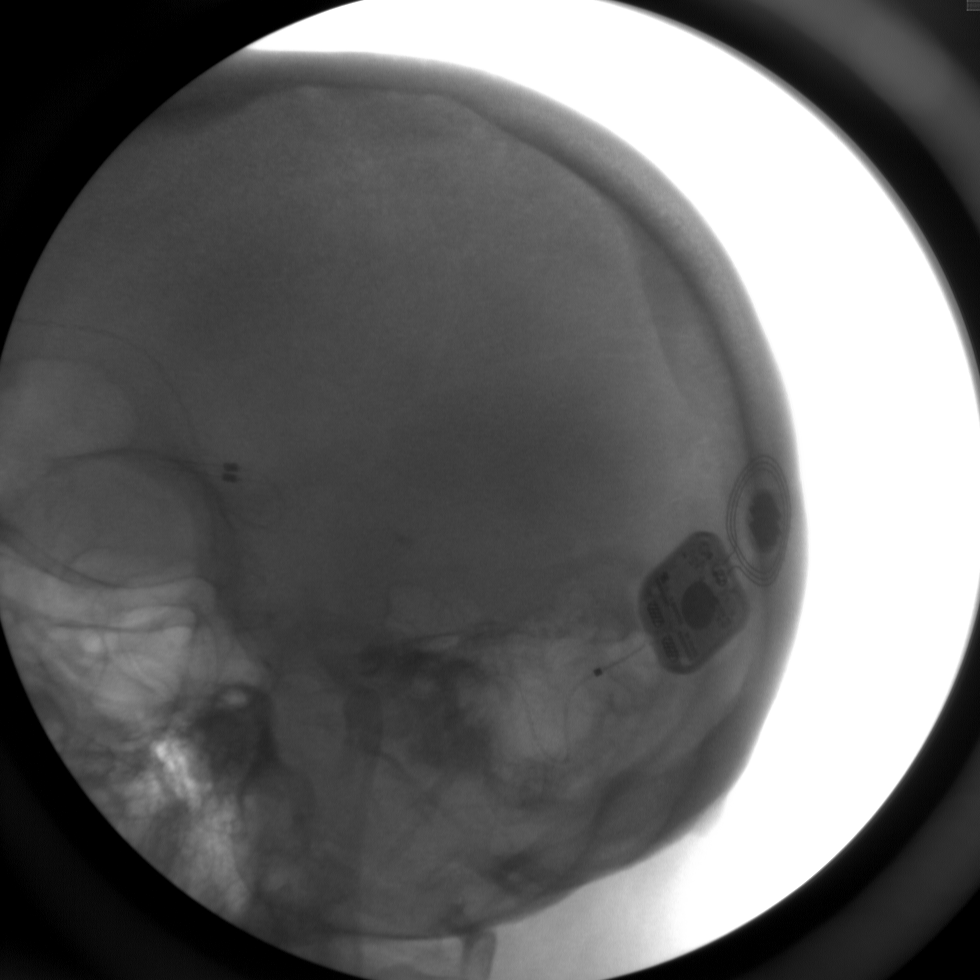

[1 of 1 positions shown; findings below may reference images not displayed]

FINDINGS: C-arm radiograph demonstrates cochlear implant device overlying the
LEFT temporal bone.
IMPRESSION: As above.

## 2021-03-28 ENCOUNTER — Encounter: Payer: Self-pay | Admitting: Family Medicine

## 2021-03-28 ENCOUNTER — Telehealth: Payer: Self-pay | Admitting: Family Medicine

## 2021-03-28 NOTE — Telephone Encounter (Signed)
Would like [facility], she can get it cheaper there than at [facility]. Search for pharmacy by phone # pt gave [phone], name of pharmacy came up with [facility]. Pt's last OV & RF 09/27/20 Next Ov 04/19/21

## 2021-03-29 ENCOUNTER — Other Ambulatory Visit: Payer: Self-pay | Admitting: Family Medicine

## 2021-03-29 MED ORDER — SILDENAFIL CITRATE 20 MG PO TABS: ORAL_TABLET | ORAL | 5 refills | Status: DC

## 2021-03-29 NOTE — Telephone Encounter (Signed)
Please let the patient know that I sent their prescription to their pharmacy. Thanks, WS 

## 2021-04-19 ENCOUNTER — Ambulatory Visit: Payer: Medicare Other | Admitting: Family Medicine

## 2021-04-27 ENCOUNTER — Encounter: Payer: Self-pay | Admitting: Family Medicine

## 2021-04-27 ENCOUNTER — Ambulatory Visit (INDEPENDENT_AMBULATORY_CARE_PROVIDER_SITE_OTHER): Payer: Medicare Other | Admitting: Family Medicine

## 2021-04-27 VITALS — BP 109/69 | HR 78 | Temp 97.8°F | Ht 76.0 in | Wt 186.0 lb

## 2021-04-27 DIAGNOSIS — I10 Essential (primary) hypertension: Secondary | ICD-10-CM | POA: Diagnosis not present

## 2021-04-27 DIAGNOSIS — E1140 Type 2 diabetes mellitus with diabetic neuropathy, unspecified: Secondary | ICD-10-CM

## 2021-04-27 DIAGNOSIS — E782 Mixed hyperlipidemia: Secondary | ICD-10-CM | POA: Diagnosis not present

## 2021-04-27 LAB — CMP14+EGFR
ALT: 17 IU/L (ref 0–44)
AST: 17 IU/L (ref 0–40)
Albumin/Globulin Ratio: 1.8 (ref 1.2–2.2)
Albumin: 4.8 g/dL — ABNORMAL HIGH (ref 3.7–4.7)
Alkaline Phosphatase: 60 IU/L (ref 44–121)
BUN/Creatinine Ratio: 17 (ref 10–24)
BUN: 21 mg/dL (ref 8–27)
Bilirubin Total: 0.5 mg/dL (ref 0.0–1.2)
CO2: 24 mmol/L (ref 20–29)
Calcium: 10.1 mg/dL (ref 8.6–10.2)
Chloride: 100 mmol/L (ref 96–106)
Creatinine, Ser: 1.25 mg/dL (ref 0.76–1.27)
Globulin, Total: 2.7 g/dL (ref 1.5–4.5)
Glucose: 113 mg/dL — ABNORMAL HIGH (ref 70–99)
Potassium: 5.4 mmol/L — ABNORMAL HIGH (ref 3.5–5.2)
Sodium: 140 mmol/L (ref 134–144)
Total Protein: 7.5 g/dL (ref 6.0–8.5)
eGFR: 62 mL/min/{1.73_m2} (ref >59)

## 2021-04-27 LAB — CBC WITH DIFFERENTIAL/PLATELET
Basophils Absolute: 0 10*3/uL (ref 0.0–0.2)
Basos: 1 %
EOS (ABSOLUTE): 0.1 10*3/uL (ref 0.0–0.4)
Eos: 1 %
Hematocrit: 44.5 % (ref 37.5–51.0)
Hemoglobin: 15.4 g/dL (ref 13.0–17.7)
Immature Grans (Abs): 0 10*3/uL (ref 0.0–0.1)
Immature Granulocytes: 0 %
Lymphocytes Absolute: 2 10*3/uL (ref 0.7–3.1)
Lymphs: 33 %
MCH: 30.7 pg (ref 26.6–33.0)
MCHC: 34.6 g/dL (ref 31.5–35.7)
MCV: 89 fL (ref 79–97)
Monocytes Absolute: 0.5 10*3/uL (ref 0.1–0.9)
Monocytes: 9 %
Neutrophils Absolute: 3.4 10*3/uL (ref 1.4–7.0)
Neutrophils: 56 %
Platelets: 252 10*3/uL (ref 150–450)
RBC: 5.01 x10E6/uL (ref 4.14–5.80)
RDW: 12.2 % (ref 11.6–15.4)
WBC: 6 10*3/uL (ref 3.4–10.8)

## 2021-04-27 LAB — LIPID PANEL
Chol/HDL Ratio: 4.4 ratio (ref 0.0–5.0)
Cholesterol, Total: 160 mg/dL (ref 100–199)
HDL: 36 mg/dL — ABNORMAL LOW (ref >39)
LDL Chol Calc (NIH): 99 mg/dL (ref 0–99)
Triglycerides: 140 mg/dL (ref 0–149)
VLDL Cholesterol Cal: 25 mg/dL (ref 5–40)

## 2021-04-27 LAB — BAYER DCA HB A1C WAIVED: HB A1C (BAYER DCA - WAIVED): 6.1 % — ABNORMAL HIGH (ref 4.8–5.6)

## 2021-04-27 NOTE — Progress Notes (Signed)
Subjective:  Patient ID: Franklin [name],  male    DOB: 1951/04/15  Age: 71 y.o.    CC: Medical Management of Chronic Issues, Hyperlipidemia, Diabetes, and Hypertension   HPI Franklin [name] presents for  follow-up of hypertension. Patient has no history of headache chest pain or shortness of breath or recent cough. Patient also denies symptoms of TIA such as numbness weakness lateralizing. Patient denies side effects from medication. States taking it regularly.  Patient also  in for follow-up of elevated cholesterol. Doing well without complaints on current medication. Denies side effects  including myalgia and arthralgia and nausea. Also in today for liver function testing. Currently no chest pain, shortness of breath or other cardiovascular related symptoms noted.  Follow-up of diabetes. Patient does check blood sugar at home occasionally. Readings run between 120-130.  Patient denies symptoms such as excessive hunger or urinary frequency, excessive hunger, nausea No significant hypoglycemic spells noted. Medications reviewed. Pt reports taking them regularly. Pt. denies complication/adverse reaction today.    History Franklin [name] has a past medical history of Charcot-Marie-Tooth disease, COPD (chronic obstructive pulmonary disease) ([place]), Coronary artery disease (2009), Coronary atherosclerosis of native coronary artery, Hearing loss, Other and unspecified hyperlipidemia, Peripheral neuropathy, Statin intolerance, and Type 2 diabetes mellitus ([place]).   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   His family history includes Alzheimer's disease in his mother; Charcot-Marie-Tooth disease in his daughter and sister; Hearing loss in his son; Stroke in his son.He reports that he has quit smoking. His smoking use included cigarettes. He started smoking about 55 years ago. He has a 40.00  pack-year smoking history. He uses smokeless tobacco. He reports that he does not drink alcohol and does not use drugs.  Current Outpatient Medications on File Prior to Visit  Medication Sig Dispense Refill   aspirin EC 81 MG tablet Take 81 mg by mouth daily.     Blood Glucose Monitoring Suppl (ONE TOUCH ULTRA 2) w/Device KIT Test sugars 3 times daily 1 each 0   clopidogrel (PLAVIX) 75 MG tablet Take 1 tablet (75 mg total) by mouth daily. 90 tablet 3   glucose blood (ONE TOUCH TEST STRIPS) test strip Test 3 times daily   E11.9 100 each 12   metFORMIN (GLUCOPHAGE) 500 MG tablet Take 1 tablet (500 mg total) by mouth 2 (two) times daily with a meal. 180 tablet 3   ONE TOUCH LANCETS MISC 1 each by Does not apply route 2 (two) times daily. 100 each 11   sildenafil (REVATIO) 20 MG tablet Take 2-5 pills at once, orally, with each sexual encounter 50 tablet 5   No current facility-administered medications on file prior to visit.    ROS Review of Systems  Constitutional:  Negative for fever.  Respiratory:  Negative for shortness of breath.   Cardiovascular:  Negative for chest pain.  Musculoskeletal:  Negative for arthralgias.  Skin:  Negative for rash.   Objective:  BP 109/69    Pulse 78    Temp 97.8 F (36.6 C) (Temporal)    Ht _0  (1.93 m)    Wt 186 lb (84.4 kg)    BMI 22.64 kg/m   BP Readings from Last 3 Encounters:  04/27/21 109/69  09/27/20 127/76  08/17/20 120/78    Wt Readings from Last 3 Encounters:  04/27/21 186 lb (84.4 kg)  08/17/20 180 lb (81.6 kg)  03/29/20 182 lb  6 oz (82.7 kg)     Physical Exam Vitals reviewed.  Constitutional:      General: He is not in acute distress.    Appearance: He is well-developed.  HENT:     Head: Normocephalic and atraumatic.     Right Ear: External ear normal.     Left Ear: External ear normal.     Nose: Nose normal.     Mouth/Throat:     Pharynx: No oropharyngeal exudate or posterior oropharyngeal erythema.  Eyes:      Conjunctiva/sclera: Conjunctivae normal.     Pupils: Pupils are equal, round, and reactive to light.  Cardiovascular:     Rate and Rhythm: Normal rate and regular rhythm.     Heart sounds: Normal heart sounds. No murmur heard. Pulmonary:     Effort: Pulmonary effort is normal. No respiratory distress.     Breath sounds: Normal breath sounds. No wheezing or rales.  Abdominal:     Palpations: Abdomen is soft.     Tenderness: There is no abdominal tenderness.  Musculoskeletal:        General: Normal range of motion.     Cervical back: Normal range of motion and neck supple.  Skin:    General: Skin is warm and dry.  Neurological:     Mental Status: He is alert and oriented to person, place, and time.     Deep Tendon Reflexes: Reflexes are normal and symmetric.  Psychiatric:        Behavior: Behavior normal.        Thought Content: Thought content normal.        Judgment: Judgment normal.    Diabetic Foot Exam - Simple   No data filed       Assessment & Plan:   Franklin [name] was seen today for medical management of chronic issues, hyperlipidemia, diabetes and hypertension.  Diagnoses and all orders for this visit:  Type 2 diabetes mellitus with diabetic neuropathy, without long-term current use of insulin (HCC) -     CBC with Differential/Platelet -     CMP14+EGFR -     Lipid panel -     Bayer DCA Hb A1c Waived -     Microalbumin / creatinine urine ratio  Essential hypertension -     CBC with Differential/Platelet -     CMP14+EGFR -     Microalbumin / creatinine urine ratio  Mixed hyperlipidemia -     CBC with Differential/Platelet -     CMP14+EGFR -     Lipid panel   I am having Franklin [name] maintain his aspirin EC, ONE TOUCH LANCETS, glucose blood, ONE TOUCH ULTRA 2, clopidogrel, metFORMIN, and sildenafil.  No orders of the defined types were placed in this encounter.    Follow-up: Return in about 6 months (around 10/25/2021).  Claretta Fraise, M.D.

## 2021-04-30 NOTE — Progress Notes (Signed)
Hello Almon,  Your lab result is normal and/or stable.Some minor variations that are not significant are commonly marked abnormal, but do not represent any medical problem for you.  Best regards, Tashena Ibach, M.D.

## 2021-05-03 ENCOUNTER — Ambulatory Visit (INDEPENDENT_AMBULATORY_CARE_PROVIDER_SITE_OTHER): Payer: Medicare Other

## 2021-05-03 VITALS — Ht 76.0 in | Wt 186.0 lb

## 2021-05-03 DIAGNOSIS — Z0000 Encounter for general adult medical examination without abnormal findings: Secondary | ICD-10-CM

## 2021-05-03 NOTE — Patient Instructions (Signed)
[name] , Thank you for taking time to come for your Medicare Wellness Visit. I appreciate your ongoing commitment to your health goals. Please review the following plan we discussed and let me know if I can assist you in the future.   Screening recommendations/referrals: Colonoscopy: Complete Cologuard.  Recommended yearly ophthalmology/optometry visit for glaucoma screening and checkup Recommended yearly dental visit for hygiene and checkup  Vaccinations: Influenza vaccine: Declined. Pneumococcal vaccine: Done 09/02/2018. Second dose due. Tdap vaccine: Done 04/23/2008 Repeat in 10 years  Shingles vaccine: Declined.   Covid-19: Declined.  Advanced directives: Please bring a copy of your health care power of attorney and living will to the office to be added to your chart at your convenience.   Conditions/risks identified: If you wish to quit smoking, help is available. For free tobacco cessation program offerings call the [facility] at [phone] or Live Well Line at [phone]([phone]. You may also visit [email or web address] or email [email or web address] for more information on other programs.   You may also call 1-800-QUIT-NOW ((251)123-63771-602-478-9260) or visit www.[place].chSmokeFree.gov or www.[facility]ome[facility].org for additional resources on smoking cessation.    Next appointment: Follow up in one year for your annual wellness visit. 2024.  Preventive Care 765 Years and Older, Male  Preventive care refers to lifestyle choices and visits with your health care provider that can promote health and wellness. What does preventive care include? A yearly physical exam. This is also called an annual well check. Dental exams once or twice a year. Routine eye exams. Ask your health care provider how often you should have your eyes checked. Personal lifestyle choices, including: Daily care of your teeth and gums. Regular physical activity. Eating a healthy diet. Avoiding tobacco and drug  use. Limiting alcohol use. Practicing safe sex. Taking low doses of aspirin every day. Taking vitamin and mineral supplements as recommended by your health care provider. What happens during an annual well check? The services and screenings done by your health care provider during your annual well check will depend on your age, overall health, lifestyle risk factors, and family history of disease. Counseling  Your health care provider may ask you questions about your: Alcohol use. Tobacco use. Drug use. Emotional well-being. Home and relationship well-being. Sexual activity. Eating habits. History of falls. Memory and ability to understand (cognition). Work and work [employer]. Screening  You may have the following tests or measurements: Height, weight, and BMI. Blood pressure. Lipid and cholesterol levels. These may be checked every 5 years, or more frequently if you are over 72 years old. Skin check. Lung cancer screening. You may have this screening every year starting at age 72 if you have a 30-pack-year history of smoking and currently smoke or have quit within the past 15 years. Fecal occult blood test (FOBT) of the stool. You may have this test every year starting at age 72. Flexible sigmoidoscopy or colonoscopy. You may have a sigmoidoscopy every 5 years or a colonoscopy every 10 years starting at age 72. Prostate cancer screening. Recommendations will vary depending on your family history and other risks. Hepatitis C blood test. Hepatitis B blood test. Sexually transmitted disease (STD) testing. Diabetes screening. This is done by checking your blood sugar (glucose) after you have not eaten for a while (fasting). You may have this done every 1-3 years. Abdominal aortic aneurysm (AAA) screening. You may need this if you are a current or former smoker. Osteoporosis. You may be screened starting at age 72 if you  are at high risk. Talk with your health care provider about  your test results, treatment options, and if necessary, the need for more tests. Vaccines  Your health care provider may recommend certain vaccines, such as: Influenza vaccine. This is recommended every year. Tetanus, diphtheria, and acellular pertussis (Tdap, Td) vaccine. You may need a Td booster every 10 years. Zoster vaccine. You may need this after age 22. Pneumococcal 13-valent conjugate (PCV13) vaccine. One dose is recommended after age 66. Pneumococcal polysaccharide (PPSV23) vaccine. One dose is recommended after age 27. Talk to your health care provider about which screenings and vaccines you need and how often you need them. This information is not intended to replace advice given to you by your health care provider. Make sure you discuss any questions you have with your health care provider. Document Released: 05/06/2015 Document Revised: 12/28/2015 Document Reviewed: 02/08/2015 Elsevier Interactive Patient Education  2017 [employer].  Fall Prevention in the Home Falls can cause injuries. They can happen to people of all ages. There are many things you can do to make your home safe and to help prevent falls. What can I do on the outside of my home? Regularly fix the edges of walkways and driveways and fix any cracks. Remove anything that might make you trip as you walk through a door, such as a raised step or threshold. Trim any bushes or trees on the path to your home. Use bright outdoor lighting. Clear any walking paths of anything that might make someone trip, such as rocks or tools. Regularly check to see if handrails are loose or broken. Make sure that both sides of any steps have handrails. Any raised decks and porches should have guardrails on the edges. Have any leaves, snow, or ice cleared regularly. Use sand or salt on walking paths during winter. Clean up any spills in your garage right away. This includes oil or grease spills. What can I do in the bathroom? Use  night lights. Install grab bars by the toilet and in the tub and shower. Do not use towel bars as grab bars. Use non-skid mats or decals in the tub or shower. If you need to sit down in the shower, use a plastic, non-slip stool. Keep the floor dry. Clean up any water that spills on the floor as soon as it happens. Remove soap buildup in the tub or shower regularly. Attach bath mats securely with double-sided non-slip rug tape. Do not have throw rugs and other things on the floor that can make you trip. What can I do in the bedroom? Use night lights. Make sure that you have a light by your bed that is easy to reach. Do not use any sheets or blankets that are too big for your bed. They should not hang down onto the floor. Have a firm chair that has side arms. You can use this for support while you get dressed. Do not have throw rugs and other things on the floor that can make you trip. What can I do in the kitchen? Clean up any spills right away. Avoid walking on wet floors. Keep items that you use a lot in easy-to-reach places. If you need to reach something above you, use a strong step stool that has a grab bar. Keep electrical cords out of the way. Do not use floor polish or wax that makes floors slippery. If you must use wax, use non-skid floor wax. Do not have throw rugs and other things on the  floor that can make you trip. What can I do with my stairs? Do not leave any items on the stairs. Make sure that there are handrails on both sides of the stairs and use them. Fix handrails that are broken or loose. Make sure that handrails are as long as the stairways. Check any carpeting to make sure that it is firmly attached to the stairs. Fix any carpet that is loose or worn. Avoid having throw rugs at the top or bottom of the stairs. If you do have throw rugs, attach them to the floor with carpet tape. Make sure that you have a light switch at the top of the stairs and the bottom of the  stairs. If you do not have them, ask someone to add them for you. What else can I do to help prevent falls? Wear shoes that: Do not have high heels. Have rubber bottoms. Are comfortable and fit you well. Are closed at the toe. Do not wear sandals. If you use a stepladder: Make sure that it is fully opened. Do not climb a closed stepladder. Make sure that both sides of the stepladder are locked into place. Ask someone to hold it for you, if possible. Clearly mark and make sure that you can see: Any grab bars or handrails. First and last steps. Where the edge of each step is. Use tools that help you move around (mobility aids) if they are needed. These include: Canes. Walkers. Scooters. Crutches. Turn on the lights when you go into a dark area. Replace any light bulbs as soon as they burn out. Set up your furniture so you have a clear path. Avoid moving your furniture around. If any of your floors are uneven, fix them. If there are any pets around you, be aware of where they are. Review your medicines with your doctor. Some medicines can make you feel dizzy. This can increase your chance of falling. Ask your doctor what other things that you can do to help prevent falls. This information is not intended to replace advice given to you by your health care provider. Make sure you discuss any questions you have with your health care provider. Document Released: 02/03/2009 Document Revised: 09/15/2015 Document Reviewed: 05/14/2014 Elsevier Interactive Patient Education  2017 [employer].

## 2021-05-03 NOTE — Progress Notes (Signed)
Subjective:   Franklin [name] is a 71 y.o. male who presents for Medicare Annual/Subsequent preventive examination. Virtual Visit via Telephone Note  I connected with  Franklin [name] on 05/03/21 at  9:45 AM EST by telephone and verified that I am speaking with the correct person using two identifiers.  Location: Patient: HOME Provider: [facility] Persons participating in the virtual visit: patient/Nurse Health Advisor   I discussed the limitations, risks, security and privacy concerns of performing an evaluation and management service by telephone and the availability of in person appointments. The patient expressed understanding and agreed to proceed.  Interactive audio and video telecommunications were attempted between this nurse and patient, however failed, due to patient having technical difficulties OR patient did not have access to video capability.  We continued and completed visit with audio only.  Some vital signs may be absent or patient reported.   Franklin Driver, LPN  Review of Systems     Cardiac Risk Factors include: advanced age (>59mn, >>2women);diabetes mellitus;hypertension;dyslipidemia;male gender;smoking/ tobacco exposure  PHONE VISIT. [facility]    Objective:    Today's Vitals   05/03/21 0944  Weight: 186 lb (84.4 kg)  Height: _0  (1.93 m)   Body mass index is 22.64 kg/m.  Advanced Directives 05/03/2021 09/03/2019 09/30/2018 09/24/2018 09/01/2018  Does Patient Have a Medical Advance Directive? Yes No No No No  Type of [place] will - - - -  Copy of [place]? No - copy requested - - - -  Would patient like information on creating a medical advance directive? No - Patient declined No - Patient declined No - Patient declined No - Patient declined Yes (MAU/Ambulatory/Procedural Areas - Information given)    Current Medications (verified) Outpatient Encounter  Medications as of 05/03/2021  Medication Sig   aspirin EC 81 MG tablet Take 81 mg by mouth daily.   Blood Glucose Monitoring Suppl (ONE TOUCH ULTRA 2) w/Device KIT Test sugars 3 times daily   clopidogrel (PLAVIX) 75 MG tablet Take 1 tablet (75 mg total) by mouth daily.   glucose blood (ONE TOUCH TEST STRIPS) test strip Test 3 times daily   E11.9   metFORMIN (GLUCOPHAGE) 500 MG tablet Take 1 tablet (500 mg total) by mouth 2 (two) times daily with a meal.   ONE TOUCH LANCETS MISC 1 each by Does not apply route 2 (two) times daily.   sildenafil (REVATIO) 20 MG tablet Take 2-5 pills at once, orally, with each sexual encounter   No facility-administered encounter medications on file as of 05/03/2021.    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  EF 55-60%. Non-ischemic exercise Myoview; EF 50%, October 2009.     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   Procedure: COCHLEAR IMPLANT;  Surgeon: Leta Baptist, MD;  Location: [place];  Service: ENT;  Laterality: Left;   HERNIA REPAIR  25's   Family History  Problem Relation Age of Onset   Alzheimer's disease Mother     Charcot-Marie-Tooth disease Sister    Charcot-Marie-Tooth disease Daughter    Hearing loss Son    Stroke Son    Social History   Socioeconomic History   Marital status: Married    Spouse name: [name]   Number of children: 5   Years of education: Not on file   Highest education level: 12th grade  Occupational History   Occupation: Retired  Tobacco Use   Smoking status: Former    Packs/day: 1.00    Years: 40.00    Pack years: 40.00    Types: Cigarettes    Start date: 12/05/1965   Smokeless tobacco: Current   Tobacco comments:    Did quit for a while but started again.  Not as heavy  Vaping Use   Vaping Use: Every day   Substances: Nicotine  Substance and Sexual Activity   Alcohol use: No    Alcohol/week: 0.0 standard drinks   Drug use: No   Sexual activity: Yes    Partners: Male  Other Topics Concern   Not on file  Social History Narrative   Married x 52 years in 2022.   5 children that all live within 30 minutes of patient.   7 grandchildren.   Social Determinants of Health   Financial Resource Strain: Low Risk    Difficulty of Paying Living Expenses: Not hard at all  Food Insecurity: No Food Insecurity   Worried About Charity fundraiser in the Last Year: Never true   [place] in the Last Year: Never true  Transportation Needs: No Transportation Needs   Lack of Transportation (Medical): No   Lack of Transportation (Non-Medical): No  Physical Activity: Sufficiently Active   Days of Exercise per Week: 5 days   Minutes of Exercise per Session: 30 min  Stress: No Stress Concern Present   Feeling of Stress : Not at all  Social Connections: Moderately Isolated   Frequency of Communication with Friends and Family: More than three times a week   Frequency of Social Gatherings with Friends and Family: More than three times a week   Attends Religious Services: Never   Marine scientist or Organizations: No   Attends Archivist Meetings: Never    Marital Status: Married    Tobacco Counseling Ready to quit: Not Answered Counseling given: Not Answered Tobacco comments: Did quit for a while but started again.  Not as heavy   Clinical Intake:  Pre-visit preparation completed: Yes  Pain : No/denies pain     BMI - recorded: 22.64 Nutritional Status: BMI of 19-24  Normal Nutritional Risks: None Diabetes: Yes  How often do you need to have someone help you when you read instructions, pamphlets, or other written materials from your doctor or pharmacy?: 1 - Never  Diabetic?Nutrition Risk Assessment:  Has the patient had any N/V/D within the last 2 months?  No  Does the patient have any non-healing wounds?  No  Has the patient had any unintentional weight loss or weight gain?  No   Diabetes:  Is the  patient diabetic?  Yes  If diabetic, was a CBG obtained today?  No  Did the patient bring in their glucometer from home?  No  How often do you monitor your CBG's? Irregularly.   Financial Strains and Diabetes Management:  Are you having any financial strains with the device, your supplies or your medication? No .  Does the patient want to be seen by Chronic Care Management for management of their diabetes?  No  Would the patient like to be referred to a Nutritionist or for Diabetic Management?  No   Diabetic Exams:  Diabetic Eye Exam: Completed 03/2021. Pt has been advised about the importance in completing this exam.   Diabetic Foot Exam: Completed 09/27/2020. Pt has been advised about the importance in completing this exam.   Interpreter Needed?: No  Information entered by :: MJ Deyna Carbon, LPN   Activities of Daily Living In your present state of health, do you have any difficulty performing the following activities: 05/03/2021  Hearing? Y  Comment Pt has genetic form of hearing loss.  Vision? N  Difficulty concentrating or making decisions? Y  Comment Memory issues at times  Walking or climbing stairs? N   Dressing or bathing? N  Doing errands, shopping? N  Preparing Food and eating ? N  Using the Toilet? N  In the past six months, have you accidently leaked urine? N  Do you have problems with loss of bowel control? N  Managing your Medications? N  Managing your Finances? N  Housekeeping or managing your Housekeeping? N  Some recent data might be hidden    Patient Care Team: Claretta Fraise, MD as PCP - General (Family Medicine) Minus Breeding, MD as PCP - Cardiology (Cardiology)  Indicate any recent Medical Services you may have received from other than [facility] providers in the past year (date may be approximate).     Assessment:   This is a routine wellness examination for [place].  Hearing/Vision screen Hearing Screening - Comments:: Hard of hearing. Cochlear implant, CMT-genetic hearing loss. Vision Screening - Comments:: No glasses. 2022. Eye exam completed at home per pt's wife, [name]. Diabetic retinopathy screen.   Dietary issues and exercise activities discussed: Current Exercise Habits: Home exercise routine, Type of exercise: walking, Time (Minutes): 30, Frequency (Times/Week): 5, Weekly Exercise (Minutes/Week): 150, Intensity: Mild, Exercise limited by: cardiac condition(s);neurologic condition(s)   Goals Addressed             This Visit's Progress    Quit Smoking   Not on track    05/03/21-Currently vapes.       Depression Screen PHQ 2/9 Scores 05/03/2021 04/27/2021 09/27/2020 03/29/2020 09/24/2019 09/03/2019 08/21/2019  PHQ - 2 Score 0 0 0 0 0 0 0  PHQ- 9 Score - 0 - - - - -    Fall Risk Fall Risk  05/03/2021 04/27/2021 09/27/2020 03/29/2020 09/24/2019  Falls in the past year? 0 0 0 0 0  Number falls in past yr: 0 - - - 0  Injury with Fall? 0 - - - 0  Risk for fall due to : - - - - No Fall Risks  Follow up - - - Falls evaluation completed Falls evaluation completed    FALL RISK PREVENTION PERTAINING TO THE HOME:  Any stairs in or around the home? No  If so, are there  any without handrails? No  Home free of loose throw rugs in walkways, pet beds, electrical cords, etc? Yes  Adequate lighting in your home to  reduce risk of falls? Yes   ASSISTIVE DEVICES UTILIZED TO PREVENT FALLS:  Life alert? Yes  Use of a cane, walker or w/c? No  Grab bars in the bathroom? Yes  Shower chair or bench in shower? Yes  Elevated toilet seat or a handicapped toilet? Yes   TIMED UP AND GO:  Was the test performed? No .  Phone visit.  Cognitive Function: 6CIT unable to be completed due to patient's hearing loss. Per pt's wife, [name], pt does not have any issues with cognitive function.     6CIT Screen 09/01/2018  What Year? 0 points  What month? 0 points  What time? 0 points  Count back from 20 0 points  Months in reverse 0 points  Repeat phrase 0 points  Total Score 0    Immunizations Immunization History  Administered Date(s) Administered   Pneumococcal Conjugate-13 09/02/2018   Tetanus 04/23/2008    TDAP status: Due, Education has been provided regarding the importance of this vaccine. Advised may receive this vaccine at local pharmacy or Health Dept. Aware to provide a copy of the vaccination record if obtained from local pharmacy or Health Dept. Verbalized acceptance and understanding.  Flu Vaccine status: Declined, Education has been provided regarding the importance of this vaccine but patient still declined. Advised may receive this vaccine at local pharmacy or Health Dept. Aware to provide a copy of the vaccination record if obtained from local pharmacy or Health Dept. Verbalized acceptance and understanding.  Pneumococcal vaccine status: Due, Education has been provided regarding the importance of this vaccine. Advised may receive this vaccine at local pharmacy or Health Dept. Aware to provide a copy of the vaccination record if obtained from local pharmacy or Health Dept. Verbalized acceptance and understanding.  Covid-19 vaccine status: Declined,  Education has been provided regarding the importance of this vaccine but patient still declined. Advised may receive this vaccine at local pharmacy or Health Dept.or vaccine clinic. Aware to provide a copy of the vaccination record if obtained from local pharmacy or Health Dept. Verbalized acceptance and understanding.  Qualifies for Shingles Vaccine? Yes   Zostavax completed  Pt declined.   Shingrix Completed?: No.    Education has been provided regarding the importance of this vaccine. Patient has been advised to call insurance company to determine out of pocket expense if they have not yet received this vaccine. Advised may also receive vaccine at local pharmacy or Health Dept. Verbalized acceptance and understanding.  Screening Tests Health Maintenance  Topic Date Due   OPHTHALMOLOGY EXAM  Never done   COLONOSCOPY (Pts 45-40yr Insurance coverage will need to be confirmed)  Never done   URINE MICROALBUMIN  01/26/2020   INFLUENZA VACCINE  07/21/2021 (Originally 11/21/2020)   Pneumonia Vaccine 72 Years old (2 - PPSV23 if available, else PCV20) 04/27/2022 (Originally 09/02/2019)   TETANUS/TDAP  04/27/2022 (Originally 04/23/2018)   COVID-19 Vaccine (1) 05/13/2022 (Originally 06/07/1951)   Zoster Vaccines- Shingrix (1 of 2) 07/27/2022 (Originally 12/06/1999)   FOOT EXAM  09/27/2021   HEMOGLOBIN A1C  10/25/2021   Hepatitis C Screening  Completed   HPV VACCINES  Aged Out    Health Maintenance  Health Maintenance Due  Topic Date Due   OPHTHALMOLOGY EXAM  Never done   COLONOSCOPY (Pts 45-425yrInsurance coverage will need to be confirmed)  Never done   URINE MICROALBUMIN  01/26/2020    Colorectal cancer screening: Referral to GI placed Pt has Cologuard kit, has not sent in yet. Pt aware the  office will call re: appt.  Lung Cancer Screening: (Low Dose CT Chest recommended if Age 48-80 years, 30 pack-year currently smoking OR have quit w/in 15years.) does qualify.   Lung Cancer Screening  Referral: Pt declined.  Additional Screening:  Hepatitis C Screening: does qualify; Completed 01/26/2019  Vision Screening: Recommended annual ophthalmology exams for early detection of glaucoma and other disorders of the eye. Is the patient up to date with their annual eye exam?  Yes  Who is the provider or what is the name of the office in which the patient attends annual eye exams? Retinal screening done at home per pt's wife. If pt is not established with a provider, would they like to be referred to a provider to establish care? No .   Dental Screening: Recommended annual dental exams for proper oral hygiene  Community Resource Referral / Chronic Care Management: CRR required this visit?  No   CCM required this visit?  No      Plan:     I have personally reviewed and noted the following in the patients chart:   Medical and social history Use of alcohol, tobacco or illicit drugs  Current medications and supplements including opioid prescriptions. Patient is not currently taking opioid prescriptions. Functional ability and status Nutritional status Physical activity Advanced directives List of other physicians Hospitalizations, surgeries, and ER visits in previous 12 months Vitals Screenings to include cognitive, depression, and falls Referrals and appointments  In addition, I have reviewed and discussed with patient certain preventive protocols, quality metrics, and best practice recommendations. A written personalized care plan for preventive services as well as general preventive health recommendations were provided to patient.     Franklin Driver, LPN   8/59/2924   Nurse Notes:Visit completed with pt's wife, [name], due to patient's hearing loss. Pt has Cologuard kit but has not completed yet. Eye exam, Diabetic retinopathy screen, done at home per pt's wife and she states she will bring the paperwork to the office for pt's chart. Declined all vaccines per pt's  wife. 6CIT not performed due to pt's hearing loss.

## 2021-09-01 ENCOUNTER — Other Ambulatory Visit: Payer: Self-pay | Admitting: Family Medicine

## 2021-09-04 NOTE — Telephone Encounter (Signed)
Stacks NTBS in July for 6 mos ckup. RF sent to pharmacy ?

## 2021-09-04 NOTE — Telephone Encounter (Signed)
Pt made appt for 10/26/2021 with PCP ?

## 2021-09-27 ENCOUNTER — Ambulatory Visit: Payer: Medicare Other | Admitting: Cardiology

## 2021-10-26 ENCOUNTER — Ambulatory Visit: Payer: Medicare Other | Admitting: Family Medicine

## 2021-11-06 ENCOUNTER — Other Ambulatory Visit: Payer: Self-pay | Admitting: Family Medicine

## 2021-12-13 ENCOUNTER — Encounter: Payer: Self-pay | Admitting: Family Medicine

## 2021-12-13 ENCOUNTER — Ambulatory Visit (INDEPENDENT_AMBULATORY_CARE_PROVIDER_SITE_OTHER): Payer: Medicare Other | Admitting: Family Medicine

## 2021-12-13 VITALS — BP 134/77 | HR 73 | Temp 97.8°F | Ht 76.0 in | Wt 182.2 lb

## 2021-12-13 DIAGNOSIS — I10 Essential (primary) hypertension: Secondary | ICD-10-CM

## 2021-12-13 DIAGNOSIS — E1140 Type 2 diabetes mellitus with diabetic neuropathy, unspecified: Secondary | ICD-10-CM | POA: Diagnosis not present

## 2021-12-13 DIAGNOSIS — E782 Mixed hyperlipidemia: Secondary | ICD-10-CM

## 2021-12-13 LAB — BAYER DCA HB A1C WAIVED: HB A1C (BAYER DCA - WAIVED): 6.4 % — ABNORMAL HIGH (ref 4.8–5.6)

## 2021-12-13 NOTE — Progress Notes (Signed)
Subjective:  Patient ID: Franklin [name],  male    DOB: Jan 08, 1951  Age: 71 y.o.    CC: Medical Management of Chronic Issues   HPI Franklin [name] presents for  follow-up of hypertension. Patient has no history of headache chest pain or shortness of breath or recent cough. Patient also denies symptoms of TIA such as numbness weakness lateralizing. Patient denies side effects from medication. States taking it regularly.  Patient also  in for follow-up of elevated cholesterol. Doing well without complaints on current medication. Denies side effects  including myalgia and arthralgia and nausea. Also in today for liver function testing. Currently no chest pain, shortness of breath or other cardiovascular related symptoms noted.  Follow-up of diabetes. Patient does not check blood sugar at home.Patient denies symptoms such as excessive hunger or urinary frequency, excessive hunger, nausea No significant hypoglycemic spells noted. Medications reviewed. Pt reports taking them regularly. Pt. denies complication/adverse reaction today.    History Franklin [name] has a past medical history of Charcot-Marie-Tooth disease, COPD (chronic obstructive pulmonary disease) ([place]), Coronary artery disease (2009), Coronary atherosclerosis of native coronary artery, Hearing loss, Other and unspecified hyperlipidemia, Peripheral neuropathy, Statin intolerance, and Type 2 diabetes mellitus ([place]).   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   His family history includes Alzheimer's disease in his mother; Charcot-Marie-Tooth disease in his daughter and sister; Hearing loss in his son; Stroke in his son.He reports that he has quit smoking. His smoking use included cigarettes. He started smoking about 56 years ago. He has a 40.00 pack-year smoking history. He uses smokeless tobacco. He reports that he does not drink  alcohol and does not use drugs.  Current Outpatient Medications on File Prior to Visit  Medication Sig Dispense Refill   aspirin EC 81 MG tablet Take 81 mg by mouth daily.     Blood Glucose Monitoring Suppl (ONE TOUCH ULTRA 2) w/Device KIT Test sugars 3 times daily 1 each 0   clopidogrel (PLAVIX) 75 MG tablet TAKE 1 TABLET BY MOUTH DAILY 90 tablet 3   glucose blood (ONE TOUCH TEST STRIPS) test strip Test 3 times daily   E11.9 100 each 12   metFORMIN (GLUCOPHAGE) 500 MG tablet TAKE 1 TABLET BY MOUTH  TWICE DAILY WITH MEALS 180 tablet 0   ONE TOUCH LANCETS MISC 1 each by Does not apply route 2 (two) times daily. 100 each 11   sildenafil (REVATIO) 20 MG tablet Take 2-5 pills at once, orally, with each sexual encounter 50 tablet 5   No current facility-administered medications on file prior to visit.    ROS Review of Systems  Constitutional:  Negative for fever.  Respiratory:  Negative for shortness of breath.   Cardiovascular:  Negative for chest pain.  Musculoskeletal:  Negative for arthralgias.  Skin:  Negative for rash.    Objective:  BP 134/77   Pulse 73   Temp 97.8 F (36.6 C)   Ht $R'6\' 4"'pL$  (1.93 m)   Wt 182 lb 3.2 oz (82.6 kg)   SpO2 100%   BMI 22.18 kg/m   BP Readings from Last 3 Encounters:  12/13/21 134/77  04/27/21 109/69  09/27/20 127/76    Wt Readings from Last 3 Encounters:  12/13/21 182 lb 3.2 oz (82.6 kg)  05/03/21 186 lb (84.4 kg)  04/27/21 186 lb (84.4 kg)     Physical Exam Vitals reviewed.  Constitutional:  Appearance: He is well-developed.  HENT:     Head: Normocephalic and atraumatic.     Right Ear: External ear normal.     Left Ear: External ear normal.     Mouth/Throat:     Pharynx: No oropharyngeal exudate or posterior oropharyngeal erythema.  Eyes:     Pupils: Pupils are equal, round, and reactive to light.  Cardiovascular:     Rate and Rhythm: Normal rate and regular rhythm.     Heart sounds: No murmur heard. Pulmonary:      Effort: No respiratory distress.     Breath sounds: Normal breath sounds.  Musculoskeletal:     Cervical back: Normal range of motion and neck supple.  Neurological:     Mental Status: He is alert and oriented to person, place, and time.     Diabetic Foot Exam - Simple   No data filed     Lab Results  Component Value Date   HGBA1C 6.4 (H) 12/13/2021   HGBA1C 6.1 (H) 04/27/2021   HGBA1C 5.8 09/27/2020    Assessment & Plan:   Franklin [name] was seen today for medical management of chronic issues.  Diagnoses and all orders for this visit:  Type 2 diabetes mellitus with diabetic neuropathy, without long-term current use of insulin (HCC) -     Bayer DCA Hb A1c Waived -     Microalbumin / creatinine urine ratio  Essential hypertension -     CBC with Differential/Platelet -     CMP14+EGFR  Mixed hyperlipidemia -     Lipid panel   I am having Franklin [name] maintain his aspirin EC, ONE TOUCH LANCETS, glucose blood, ONE TOUCH ULTRA 2, sildenafil, metFORMIN, and clopidogrel.  No orders of the defined types were placed in this encounter.    Follow-up: Return in about 6 months (around 06/15/2022).  Claretta Fraise, M.D.

## 2021-12-14 LAB — CBC WITH DIFFERENTIAL/PLATELET
Basophils Absolute: 0 10*3/uL (ref 0.0–0.2)
Basos: 0 %
EOS (ABSOLUTE): 0.1 10*3/uL (ref 0.0–0.4)
Eos: 1 %
Hematocrit: 46.2 % (ref 37.5–51.0)
Hemoglobin: 15.4 g/dL (ref 13.0–17.7)
Immature Grans (Abs): 0 10*3/uL (ref 0.0–0.1)
Immature Granulocytes: 0 %
Lymphocytes Absolute: 1.8 10*3/uL (ref 0.7–3.1)
Lymphs: 31 %
MCH: 30.7 pg (ref 26.6–33.0)
MCHC: 33.3 g/dL (ref 31.5–35.7)
MCV: 92 fL (ref 79–97)
Monocytes Absolute: 0.5 10*3/uL (ref 0.1–0.9)
Monocytes: 10 %
Neutrophils Absolute: 3.2 10*3/uL (ref 1.4–7.0)
Neutrophils: 58 %
Platelets: 207 10*3/uL (ref 150–450)
RBC: 5.01 x10E6/uL (ref 4.14–5.80)
RDW: 12.3 % (ref 11.6–15.4)
WBC: 5.6 10*3/uL (ref 3.4–10.8)

## 2021-12-14 LAB — CMP14+EGFR
ALT: 45 IU/L — ABNORMAL HIGH (ref 0–44)
AST: 30 IU/L (ref 0–40)
Albumin/Globulin Ratio: 2.3 — ABNORMAL HIGH (ref 1.2–2.2)
Albumin: 5 g/dL — ABNORMAL HIGH (ref 3.8–4.8)
Alkaline Phosphatase: 46 IU/L (ref 44–121)
BUN/Creatinine Ratio: 15 (ref 10–24)
BUN: 20 mg/dL (ref 8–27)
Bilirubin Total: 0.7 mg/dL (ref 0.0–1.2)
CO2: 23 mmol/L (ref 20–29)
Calcium: 10.1 mg/dL (ref 8.6–10.2)
Chloride: 99 mmol/L (ref 96–106)
Creatinine, Ser: 1.32 mg/dL — ABNORMAL HIGH (ref 0.76–1.27)
Globulin, Total: 2.2 g/dL (ref 1.5–4.5)
Glucose: 125 mg/dL — ABNORMAL HIGH (ref 70–99)
Potassium: 4.9 mmol/L (ref 3.5–5.2)
Sodium: 140 mmol/L (ref 134–144)
Total Protein: 7.2 g/dL (ref 6.0–8.5)
eGFR: 57 mL/min/{1.73_m2} — ABNORMAL LOW (ref >59)

## 2021-12-14 LAB — LIPID PANEL
Chol/HDL Ratio: 4.7 ratio (ref 0.0–5.0)
Cholesterol, Total: 164 mg/dL (ref 100–199)
HDL: 35 mg/dL — ABNORMAL LOW (ref >39)
LDL Chol Calc (NIH): 100 mg/dL — ABNORMAL HIGH (ref 0–99)
Triglycerides: 168 mg/dL — ABNORMAL HIGH (ref 0–149)
VLDL Cholesterol Cal: 29 mg/dL (ref 5–40)

## 2021-12-18 NOTE — Progress Notes (Signed)
Hello Benji,  Your lab result is normal and/or stable.Some minor variations that are not significant are commonly marked abnormal, but do not represent any medical problem for you.  Best regards, Oluwatomisin Deman, M.D.

## 2021-12-19 NOTE — Progress Notes (Unsigned)
Cardiology Office Note   Date:  12/20/2021   ID:  Franklin [name] 02/25/1951, MRN [id number]  PCP:  Claretta Fraise, MD  Cardiologist:   Minus Breeding, MD   Chief Complaint  Patient presents with   Coronary Artery Disease      History of Present Illness: Franklin [name] is a 71 y.o. male who presents for follow up of CAD.   He returns for routine follow-up.  Since I last saw him he has done well.  He stays active. The patient denies any new symptoms such as chest discomfort, neck or arm discomfort. There has been no new shortness of breath, PND or orthopnea. There have been no reported palpitations, presyncope or syncope.    Past Medical History:  Diagnosis Date   Charcot-Marie-Tooth disease    COPD (chronic obstructive pulmonary disease) ([place])    has smokers cough   Coronary artery disease 2009   MI-stents x2   Coronary atherosclerosis of native coronary artery    History of inferior myocardial infarction, treated with   CYPHER stenting of circumflex and right coronary artery, February 2004.Status post non-ST segment myocardial infarction treated with PROMUS drug-eluting stenting of distal right coronary artery and posterior descending (coronary) artery, August 2009.  EF 55-60%. Non-ischemic exercise Myoview; EF 50%, October 2009.     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     clopidogrel (PLAVIX) 75 MG tablet TAKE 1 TABLET BY MOUTH  DAILY 90 tablet 3   metFORMIN (GLUCOPHAGE) 500 MG tablet TAKE 1 TABLET BY MOUTH  TWICE DAILY WITH MEALS 180 tablet 0   sildenafil (REVATIO) 20 MG tablet Take 2-5 pills at once, orally, with each sexual encounter 50 tablet 5   Blood Glucose Monitoring Suppl (ONE TOUCH ULTRA 2) w/Device KIT Test sugars 3 times daily 1 each 0   glucose blood (ONE TOUCH TEST STRIPS) test strip Test 3 times daily   E11.9 100 each 12   ONE TOUCH LANCETS MISC 1 each by Does not apply route 2 (two) times daily. 100 each 11   No current facility-administered medications for this visit.    Allergies:   Statins    ROS:  Please see the history of present illness.   Otherwise, review of none.   All other systems are reviewed and negative.    PHYSICAL EXAM: VS:  BP 120/70   Pulse 77   Ht $R'6\' 4"'Rz$  (1.93 m)   Wt 183 lb (83 kg)   BMI 22.28 kg/m  , BMI Body mass index is 22.28 kg/m. GENERAL:  Well appearing NECK:  No jugular venous distention, waveform within normal limits, carotid upstroke brisk and symmetric, no bruits, no thyromegaly LUNGS:  Clear to auscultation bilaterally CHEST:  Unremarkable HEART:  PMI not displaced or sustained,S1 and S2 within normal limits, no S3, no S4, no  clicks, no rubs, no murmurs ABD:  Flat, positive bowel sounds normal in frequency in pitch, no bruits, no rebound, no guarding, no midline pulsatile mass, no hepatomegaly, no splenomegaly EXT:  2 plus pulses throughout, no edema, no cyanosis no clubbing   EKG:  EKG is  ordered today. The ekg ordered today demonstrates sinus rhythm, rate 77, left axis deviation, left anterior fascicular block, intervals within normal limits, no acute ST-T wave changes.   Recent Labs: 12/13/2021: ALT 45; BUN 20; Creatinine, Ser 1.32; Hemoglobin 15.4; Platelets 207; Potassium 4.9; Sodium 140    Lipid Panel    Component Value Date/Time   CHOL 164 12/13/2021 1001   CHOL 159 10/29/2012 0941   TRIG 168 (H) 12/13/2021 1001   TRIG 170 (H) 10/29/2012  0941   HDL 35 (L) 12/13/2021 1001   HDL 30 (L) 10/29/2012 0941   CHOLHDL 4.7 12/13/2021 1001   CHOLHDL 5.7 12/03/2007 0100   VLDL 27 12/03/2007 0100   LDLCALC 100 (H) 12/13/2021 1001   LDLCALC 95 10/29/2012 0941     Wt Readings from Last 3 Encounters:  12/20/21 183 lb (83 kg)  12/13/21 182 lb 3.2 oz (82.6 kg)  05/03/21 186 lb (84.4 kg)      Other studies Reviewed: Additional studies/ records that were reviewed today include: Labs Review of the above records demonstrates:  Please see elsewhere in the note.     ASSESSMENT AND PLAN:   CAD:   The patient has no new sypmtoms.  No further cardiovascular testing is indicated.  We will continue with aggressive risk reduction and meds as listed.  I did give him a prescription for SLNTG.  He is not using Viagra.   DM:    A1C was 6.4.  No change in therapy.   HTN: His blood pressure is at target.  No change in therapy.   DYSLIPIDEMIA:  LDL was 100.  He has not tolerated statins.  I am going to send him to our [facility] to consider PCSK9.   TOBACCO:   He says he stopped smoking 2 years ago but in actuality he still smokes 1 cigarette once in a great while.  I have told him I prefer complete abstinence.   Current medicines are reviewed at length with the patient today.  The patient does not have concerns regarding medicines.  The following changes have been made:    Labs/ tests ordered today include:    Orders Placed This Encounter  Procedures   EKG 12-Lead     Disposition:   FU with me in one year.     Signed, Minus Breeding, MD  12/20/2021 12:58 PM    [facility]

## 2021-12-20 ENCOUNTER — Ambulatory Visit: Payer: Medicare Other | Admitting: Cardiology

## 2021-12-20 ENCOUNTER — Encounter: Payer: Self-pay | Admitting: Cardiology

## 2021-12-20 VITALS — BP 120/70 | HR 77 | Ht 76.0 in | Wt 183.0 lb

## 2021-12-20 DIAGNOSIS — Z789 Other specified health status: Secondary | ICD-10-CM | POA: Diagnosis not present

## 2021-12-20 DIAGNOSIS — Z720 Tobacco use: Secondary | ICD-10-CM | POA: Diagnosis not present

## 2021-12-20 DIAGNOSIS — I2510 Atherosclerotic heart disease of native coronary artery without angina pectoris: Secondary | ICD-10-CM

## 2021-12-20 DIAGNOSIS — E785 Hyperlipidemia, unspecified: Secondary | ICD-10-CM

## 2021-12-20 DIAGNOSIS — E118 Type 2 diabetes mellitus with unspecified complications: Secondary | ICD-10-CM | POA: Diagnosis not present

## 2021-12-20 DIAGNOSIS — I10 Essential (primary) hypertension: Secondary | ICD-10-CM

## 2021-12-20 DIAGNOSIS — G720 Drug-induced myopathy: Secondary | ICD-10-CM

## 2021-12-20 DIAGNOSIS — T466X5A Adverse effect of antihyperlipidemic and antiarteriosclerotic drugs, initial encounter: Secondary | ICD-10-CM

## 2021-12-20 MED ORDER — NITROGLYCERIN 0.4 MG SL SUBL: 0.4000 mg | SUBLINGUAL_TABLET | SUBLINGUAL | 4 refills | Status: DC | PRN

## 2021-12-20 NOTE — Addendum Note (Signed)
Addended by: Sharin Grave on: 12/20/2021 01:03 PM   Modules accepted: Orders

## 2021-12-20 NOTE — Patient Instructions (Addendum)
Medication Instructions:  The current medical regimen is effective;  continue present plan and medications.  *If you need a refill on your cardiac medications before your next appointment, please call your pharmacy*  You have been referred to our [facility] and will be contacted to be schedule.  Follow-Up: At [facility], you and your health needs are our priority.  As part of our continuing mission to provide you with exceptional heart care, we have created designated Provider Care Teams.  These Care Teams include your primary Cardiologist (physician) and Advanced Practice Providers (APPs -  Physician Assistants and Nurse Practitioners) who all work together to provide you with the care you need, when you need it.  We recommend signing up for the patient portal called "MyChart".  Sign up information is provided on this After Visit Summary.  MyChart is used to connect with patients for Virtual Visits (Telemedicine).  Patients are able to view lab/test results, encounter notes, upcoming appointments, etc.  Non-urgent messages can be sent to your provider as well.   To learn more about what you can do with MyChart, go to [email or web address].    Your next appointment:   1 year(s)  The format for your next appointment:   In Person  Provider:   Rollene Rotunda, MD      Important Information About Sugar

## 2021-12-20 NOTE — Addendum Note (Signed)
Addended by: Sharin Grave on: 12/20/2021 03:38 PM   Modules accepted: Orders

## 2022-01-23 ENCOUNTER — Ambulatory Visit: Payer: Medicare Other

## 2022-03-27 NOTE — Progress Notes (Signed)
[facility] Quality Team Note  Name: Franklin [name] Date of Birth: [birth year] MRN: [id number] Date: 03/27/2022  [facility] Quality Team has reviewed this patient's chart, please see recommendations below:  [facility] Quality Other; (KED GAP- KIDNEY HEALTH EVALUATION. PATIENT NEEDS URINE MICROALBUMIN/CREATININE RATIO TEST COMPLETED BY END OF YEAR FOR GAP CLOSURE)

## 2022-04-06 ENCOUNTER — Other Ambulatory Visit: Payer: Self-pay | Admitting: Family Medicine

## 2022-04-06 DIAGNOSIS — E1140 Type 2 diabetes mellitus with diabetic neuropathy, unspecified: Secondary | ICD-10-CM

## 2022-04-09 ENCOUNTER — Other Ambulatory Visit: Payer: Medicare Other

## 2022-04-09 DIAGNOSIS — E1140 Type 2 diabetes mellitus with diabetic neuropathy, unspecified: Secondary | ICD-10-CM | POA: Diagnosis not present

## 2022-04-10 LAB — MICROALBUMIN / CREATININE URINE RATIO
Creatinine, Urine: 116.6 mg/dL
Microalb/Creat Ratio: 111 mg/g creat — ABNORMAL HIGH (ref 0–29)
Microalbumin, Urine: 129.4 ug/mL

## 2022-04-18 DIAGNOSIS — G720 Drug-induced myopathy: Secondary | ICD-10-CM | POA: Insufficient documentation

## 2022-07-05 ENCOUNTER — Telehealth: Payer: Self-pay | Admitting: Family Medicine

## 2022-07-05 NOTE — Telephone Encounter (Signed)
  Prescription Request  07/05/2022  Is this a "Controlled Substance" medicine? sildenafil (REVATIO) 20 MG tablet   Have you seen your PCP in the last 2 weeks? no  If YES, route message to pool  -  If NO, patient needs to be scheduled for appointment.  What is the name of the medication or equipment? sildenafil (REVATIO) 20 MG tablet   Have you contacted your pharmacy to request a refill? no   Which pharmacy would you like this sent to? [facility] mail order   Patient notified that their request is being sent to the clinical staff for review and that they should receive a response within 2 business days.

## 2022-07-06 ENCOUNTER — Encounter: Payer: Self-pay | Admitting: Family Medicine

## 2022-07-06 NOTE — Telephone Encounter (Signed)
Stacks patient Last office visit 12/13/21 Last refill 03/29/21, #50, 5 refills

## 2022-07-08 NOTE — Telephone Encounter (Signed)
I can't refill since he is overdue for follow up

## 2022-07-09 ENCOUNTER — Other Ambulatory Visit: Payer: Self-pay | Admitting: Family Medicine

## 2022-07-09 NOTE — Telephone Encounter (Signed)
PATIENT MADE AWARE VIA MYCHART AND SCHEDULED AN APPOINTMENT

## 2022-07-09 NOTE — Telephone Encounter (Signed)
  Prescription Request  07/09/2022  Is this a "Controlled Substance" medicine? no  Have you seen your PCP in the last 2 weeks? Made appt for 4/3  If YES, route message to pool  -  If NO, patient needs to be scheduled for appointment.  What is the name of the medication or equipment?  sildenafil (REVATIO) 20 MG tablet   Have you contacted your pharmacy to request a refill? yes   Which pharmacy would you like this sent to? [facility] in [place]   Patient notified that their request is being sent to the clinical staff for review and that they should receive a response within 2 business days.

## 2022-07-25 ENCOUNTER — Encounter: Payer: Self-pay | Admitting: Family Medicine

## 2022-07-25 ENCOUNTER — Ambulatory Visit (INDEPENDENT_AMBULATORY_CARE_PROVIDER_SITE_OTHER): Payer: Medicare Other | Admitting: Family Medicine

## 2022-07-25 VITALS — BP 129/74 | HR 76 | Temp 98.3°F | Ht 76.0 in | Wt 184.6 lb

## 2022-07-25 DIAGNOSIS — I25118 Atherosclerotic heart disease of native coronary artery with other forms of angina pectoris: Secondary | ICD-10-CM

## 2022-07-25 DIAGNOSIS — E1140 Type 2 diabetes mellitus with diabetic neuropathy, unspecified: Secondary | ICD-10-CM

## 2022-07-25 DIAGNOSIS — E782 Mixed hyperlipidemia: Secondary | ICD-10-CM | POA: Diagnosis not present

## 2022-07-25 DIAGNOSIS — Z7984 Long term (current) use of oral hypoglycemic drugs: Secondary | ICD-10-CM

## 2022-07-25 DIAGNOSIS — I10 Essential (primary) hypertension: Secondary | ICD-10-CM | POA: Diagnosis not present

## 2022-07-25 DIAGNOSIS — N529 Male erectile dysfunction, unspecified: Secondary | ICD-10-CM

## 2022-07-25 LAB — BAYER DCA HB A1C WAIVED: HB A1C (BAYER DCA - WAIVED): 6.3 % — ABNORMAL HIGH (ref 4.8–5.6)

## 2022-07-25 MED ORDER — SILDENAFIL CITRATE 20 MG PO TABS: ORAL_TABLET | ORAL | 5 refills | Status: DC

## 2022-07-25 MED ORDER — CLOPIDOGREL BISULFATE 75 MG PO TABS: 75.0000 mg | ORAL_TABLET | Freq: Every day | ORAL | 3 refills | Status: DC

## 2022-07-25 MED ORDER — NITROGLYCERIN 0.4 MG SL SUBL
0.4000 mg | SUBLINGUAL_TABLET | SUBLINGUAL | 3 refills | Status: AC | PRN
Start: 2022-07-25 — End: ?

## 2022-07-25 MED ORDER — METFORMIN HCL 500 MG PO TABS: 500.0000 mg | ORAL_TABLET | Freq: Two times a day (BID) | ORAL | 3 refills | Status: DC

## 2022-07-25 NOTE — Progress Notes (Signed)
Subjective:  Patient ID: Franklin [name], male    DOB: Sep 07, 1950  Age: 72 y.o. MRN: [id number]  CC: Medical Management of Chronic Issues   HPI NASIIR SCALA presents forFollow-up of diabetes. Patient denies symptoms such as polyuria, polydipsia, excessive hunger, nausea No significant hypoglycemic spells noted. Medications reviewed. Pt reports taking them regularly without complication/adverse reaction being reported today.   Continues to have E.D. sildenafil helps.  CAD - stable. No chest pain. Knows not to take sildenafil or NTG within 24 hours after the other.  History Barri has a past medical history of Charcot-Marie-Tooth disease, COPD (chronic obstructive pulmonary disease), Coronary artery disease (2009), Coronary atherosclerosis of native coronary artery, Hearing loss, Other and unspecified hyperlipidemia, Peripheral neuropathy, Statin intolerance, and Type 2 diabetes mellitus.   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   His family history includes Alzheimer's disease in his mother; Charcot-Marie-Tooth disease in his daughter and sister; Hearing loss in his son; Stroke in his son.He reports that he has quit smoking. His smoking use included cigarettes. He started smoking about 56 years ago. He has a 40.00 pack-year smoking history. He uses smokeless tobacco. He reports that he does not drink alcohol and does not use drugs.  Current Outpatient Medications on File Prior to Visit  Medication Sig Dispense Refill   aspirin EC 81 MG tablet Take 81 mg by mouth daily.     Blood Glucose Monitoring Suppl (ONE TOUCH ULTRA 2) w/Device KIT Test sugars 3 times daily 1 each 0   glucose blood (ONE TOUCH TEST STRIPS) test strip Test 3 times daily   E11.9 100 each 12   ONE TOUCH LANCETS MISC 1 each by Does not apply route 2 (two) times daily. 100 each 11   No current  facility-administered medications on file prior to visit.    ROS Review of Systems  Constitutional:  Negative for fever.  Respiratory:  Negative for shortness of breath.   Cardiovascular:  Negative for chest pain.  Musculoskeletal:  Negative for arthralgias.  Skin:  Negative for rash.    Objective:  BP 129/74   Pulse 76   Temp 98.3 F (36.8 C)   Ht 6\' 4"  (1.93 m)   Wt 184 lb 9.6 oz (83.7 kg)   SpO2 97%   BMI 22.47 kg/m   BP Readings from Last 3 Encounters:  07/25/22 129/74  12/20/21 120/70  12/13/21 134/77    Wt Readings from Last 3 Encounters:  07/25/22 184 lb 9.6 oz (83.7 kg)  12/20/21 183 lb (83 kg)  12/13/21 182 lb 3.2 oz (82.6 kg)     Physical Exam Vitals reviewed.  Constitutional:      Appearance: He is well-developed.  HENT:     Head: Normocephalic and atraumatic.     Right Ear: External ear normal.     Left Ear: External ear normal.     Mouth/Throat:     Pharynx: No oropharyngeal exudate or posterior oropharyngeal erythema.  Eyes:     Pupils: Pupils are equal, round, and reactive to light.  Cardiovascular:     Rate and Rhythm: Normal rate and regular rhythm.     Heart sounds: No murmur heard. Pulmonary:     Effort: No respiratory distress.     Breath sounds: Normal breath sounds.  Musculoskeletal:     Cervical back: Normal range of motion and neck supple.  Neurological:     Mental  Status: He is alert and oriented to person, place, and time.       Assessment & Plan:   Seena was seen today for medical management of chronic issues.  Diagnoses and all orders for this visit:  Type 2 diabetes mellitus with diabetic neuropathy, without long-term current use of insulin -     Bayer DCA Hb A1c Waived -     CBC with Differential/Platelet -     CMP14+EGFR -     Lipid panel -     metFORMIN (GLUCOPHAGE) 500 MG tablet; Take 1 tablet (500 mg total) by mouth 2 (two) times daily with a meal.  Essential hypertension -     CBC with  Differential/Platelet -     CMP14+EGFR  Mixed hyperlipidemia -     Lipid panel  Atherosclerosis of native coronary artery of native heart with stable angina pectoris -     Lipid panel -     nitroGLYCERIN (NITROSTAT) 0.4 MG SL tablet; Place 1 tablet (0.4 mg total) under the tongue every 5 (five) minutes as needed for chest pain. -     clopidogrel (PLAVIX) 75 MG tablet; Take 1 tablet (75 mg total) by mouth daily.  Vasculogenic erectile dysfunction, unspecified vasculogenic erectile dysfunction type -     sildenafil (REVATIO) 20 MG tablet; Take 2-5 pills at once, orally, with each sexual encounter      I have changed Wallis B. Campton's metFORMIN and clopidogrel. I am also having him maintain his aspirin EC, ONE TOUCH LANCETS, glucose blood, ONE TOUCH ULTRA 2, nitroGLYCERIN, and sildenafil.  Meds ordered this encounter  Medications   metFORMIN (GLUCOPHAGE) 500 MG tablet    Sig: Take 1 tablet (500 mg total) by mouth 2 (two) times daily with a meal.    Dispense:  180 tablet    Refill:  3   nitroGLYCERIN (NITROSTAT) 0.4 MG SL tablet    Sig: Place 1 tablet (0.4 mg total) under the tongue every 5 (five) minutes as needed for chest pain.    Dispense:  75 tablet    Refill:  3   clopidogrel (PLAVIX) 75 MG tablet    Sig: Take 1 tablet (75 mg total) by mouth daily.    Dispense:  90 tablet    Refill:  3    Requesting 1 year supply   sildenafil (REVATIO) 20 MG tablet    Sig: Take 2-5 pills at once, orally, with each sexual encounter    Dispense:  50 tablet    Refill:  5     Follow-up: Return in about 6 months (around 01/24/2023).  Claretta Fraise, M.D.

## 2022-07-26 LAB — CBC WITH DIFFERENTIAL/PLATELET
Basophils Absolute: 0 10*3/uL (ref 0.0–0.2)
Basos: 0 %
EOS (ABSOLUTE): 0.1 10*3/uL (ref 0.0–0.4)
Eos: 2 %
Hematocrit: 44 % (ref 37.5–51.0)
Hemoglobin: 15.3 g/dL (ref 13.0–17.7)
Immature Grans (Abs): 0 10*3/uL (ref 0.0–0.1)
Immature Granulocytes: 0 %
Lymphocytes Absolute: 2 10*3/uL (ref 0.7–3.1)
Lymphs: 30 %
MCH: 32.2 pg (ref 26.6–33.0)
MCHC: 34.8 g/dL (ref 31.5–35.7)
MCV: 93 fL (ref 79–97)
Monocytes Absolute: 0.6 10*3/uL (ref 0.1–0.9)
Monocytes: 9 %
Neutrophils Absolute: 4 10*3/uL (ref 1.4–7.0)
Neutrophils: 59 %
Platelets: 231 10*3/uL (ref 150–450)
RBC: 4.75 x10E6/uL (ref 4.14–5.80)
RDW: 12.1 % (ref 11.6–15.4)
WBC: 6.9 10*3/uL (ref 3.4–10.8)

## 2022-07-26 LAB — CMP14+EGFR
ALT: 35 IU/L (ref 0–44)
AST: 23 IU/L (ref 0–40)
Albumin/Globulin Ratio: 1.8 (ref 1.2–2.2)
Albumin: 4.7 g/dL (ref 3.8–4.8)
Alkaline Phosphatase: 53 IU/L (ref 44–121)
BUN/Creatinine Ratio: 15 (ref 10–24)
BUN: 22 mg/dL (ref 8–27)
Bilirubin Total: 0.5 mg/dL (ref 0.0–1.2)
CO2: 22 mmol/L (ref 20–29)
Calcium: 10.1 mg/dL (ref 8.6–10.2)
Chloride: 100 mmol/L (ref 96–106)
Creatinine, Ser: 1.42 mg/dL — ABNORMAL HIGH (ref 0.76–1.27)
Globulin, Total: 2.6 g/dL (ref 1.5–4.5)
Glucose: 128 mg/dL — ABNORMAL HIGH (ref 70–99)
Potassium: 4.7 mmol/L (ref 3.5–5.2)
Sodium: 140 mmol/L (ref 134–144)
Total Protein: 7.3 g/dL (ref 6.0–8.5)
eGFR: 53 mL/min/{1.73_m2} — ABNORMAL LOW (ref >59)

## 2022-07-26 LAB — LIPID PANEL
Chol/HDL Ratio: 4.9 ratio (ref 0.0–5.0)
Cholesterol, Total: 162 mg/dL (ref 100–199)
HDL: 33 mg/dL — ABNORMAL LOW (ref >39)
LDL Chol Calc (NIH): 91 mg/dL (ref 0–99)
Triglycerides: 226 mg/dL — ABNORMAL HIGH (ref 0–149)
VLDL Cholesterol Cal: 38 mg/dL (ref 5–40)

## 2022-07-26 NOTE — Progress Notes (Signed)
Hello Janoah,  Your lab result is normal and/or stable.Some minor variations that are not significant are commonly marked abnormal, but do not represent any medical problem for you.  Best regards, Carie Kapuscinski, M.D.

## 2022-12-12 ENCOUNTER — Ambulatory Visit: Payer: Medicare Other

## 2022-12-12 VITALS — Ht 76.0 in | Wt 180.0 lb

## 2022-12-12 DIAGNOSIS — Z0000 Encounter for general adult medical examination without abnormal findings: Secondary | ICD-10-CM | POA: Diagnosis not present

## 2022-12-12 NOTE — Patient Instructions (Signed)
Franklin [name] , Thank you for taking time to come for your Medicare Wellness Visit. I appreciate your ongoing commitment to your health goals. Please review the following plan we discussed and let me know if I can assist you in the future.   Referrals/Orders/Follow-Ups/Clinician Recommendations: Aim for 30 minutes of exercise or brisk walking, 6-8 glasses of water, and 5 servings of fruits and vegetables each day.   This is a list of the screening recommended for you and due dates:  Health Maintenance  Topic Date Due   Eye exam for diabetics  Never done   Colon Cancer Screening  Never done   Screening for Lung Cancer  Never done   Zoster (Shingles) Vaccine (1 of 2) Never done   DTaP/Tdap/Td vaccine (1 - Tdap) 04/24/2008   Complete foot exam   09/27/2021   COVID-19 Vaccine (1 - 2023-24 season) Never done   Flu Shot  11/22/2022   Pneumonia Vaccine (2 of 2 - PPSV23 or PCV20) 07/25/2023*   Hemoglobin A1C  01/24/2023   Yearly kidney health urinalysis for diabetes  04/10/2023   Yearly kidney function blood test for diabetes  07/25/2023   Medicare Annual Wellness Visit  12/12/2023   Hepatitis C Screening  Completed   HPV Vaccine  Aged Out  *Topic was postponed. The date shown is not the original due date.    Advanced directives: (Copy Requested) Please bring a copy of your health care power of attorney and living will to the office to be added to your chart at your convenience.  Next Medicare Annual Wellness Visit scheduled for next year: Yes  insert Preventive Care attachment Insert FALL PREVENTION attachment if needed

## 2022-12-12 NOTE — Progress Notes (Signed)
Subjective:   Franklin [name] is a 72 y.o. male who presents for Medicare Annual/Subsequent preventive examination.  Visit Complete: Virtual  I connected with  Franklin [name] on 12/12/22 by a audio enabled telemedicine application and verified that I am speaking with the correct person using two identifiers.  Patient Location: Home  Provider Location: Home Office  I discussed the limitations of evaluation and management by telemedicine. The patient expressed understanding and agreed to proceed.  Patient Medicare AWV questionnaire was completed by the patient on 12/12/2022; I have confirmed that all information answered by patient is correct and no changes since this date.  Review of Systems    Vital Signs: Unable to obtain new vitals due to this being a telehealth visit.  Cardiac Risk Factors include: advanced age (>9men, >36 women);diabetes mellitus;dyslipidemia;male gender;hypertension Nutrition Risk Assessment:  Has the patient had any N/V/D within the last 2 months?  No  Does the patient have any non-healing wounds?  No  Has the patient had any unintentional weight loss or weight gain?  No   Diabetes:  Is the patient diabetic?  Yes  If diabetic, was a CBG obtained today?  No  Did the patient bring in their glucometer from home?  No  How often do you monitor your CBG's? Weekly .   Financial Strains and Diabetes Management:  Are you having any financial strains with the device, your supplies or your medication? No .  Does the patient want to be seen by Chronic Care Management for management of their diabetes?  No  Would the patient like to be referred to a Nutritionist or for Diabetic Management?  No   Diabetic Exams:  Diabetic Eye Exam: Completed 11/2021 Diabetic Foot Exam: Overdue, Pt has been advised about the importance in completing this exam. Pt is scheduled for diabetic foot exam on next office visit .     Objective:    Today's Vitals   12/12/22 1139   Weight: 180 lb (81.6 kg)  Height: 6\' 4"  (1.93 m)   Body mass index is 21.91 kg/m.     12/12/2022   11:42 AM 05/03/2021    9:57 AM 09/03/2019    9:31 AM 09/30/2018    8:08 AM 09/24/2018   12:26 PM 09/01/2018   10:32 AM  Advanced Directives  Does Patient Have a Medical Advance Directive? Yes Yes No No No No  Type of Estate agent of [place];Living will Healthcare Power of [place];Living will      Copy of Healthcare Power of Attorney in Chart? No - copy requested No - copy requested      Would patient like information on creating a medical advance directive?  No - Patient declined No - Patient declined No - Patient declined No - Patient declined Yes (MAU/Ambulatory/Procedural Areas - Information given)    Current Medications (verified) Outpatient Encounter Medications as of 12/12/2022  Medication Sig   aspirin EC 81 MG tablet Take 81 mg by mouth daily.   Blood Glucose Monitoring Suppl (ONE TOUCH ULTRA 2) w/Device KIT Test sugars 3 times daily   clopidogrel (PLAVIX) 75 MG tablet Take 1 tablet (75 mg total) by mouth daily.   glucose blood (ONE TOUCH TEST STRIPS) test strip Test 3 times daily   E11.9   metFORMIN (GLUCOPHAGE) 500 MG tablet Take 1 tablet (500 mg total) by mouth 2 (two) times daily with a meal.   nitroGLYCERIN (NITROSTAT) 0.4 MG SL tablet Place 1 tablet (0.4 mg total) under  the tongue every 5 (five) minutes as needed for chest pain.   ONE TOUCH LANCETS MISC 1 each by Does not apply route 2 (two) times daily.   sildenafil (REVATIO) 20 MG tablet Take 2-5 pills at once, orally, with each sexual encounter   No facility-administered encounter medications on file as of 12/12/2022.    Allergies (verified) Statins   History: Past Medical History:  Diagnosis Date   Charcot-Marie-Tooth disease    COPD (chronic obstructive pulmonary disease) (HCC)    has smokers cough   Coronary artery disease 2009   MI-stents x2   Coronary atherosclerosis of native coronary  artery    History of inferior myocardial infarction, treated with   CYPHER stenting of circumflex and right coronary artery, February 2004.Status post non-ST segment myocardial infarction treated with PROMUS drug-eluting stenting of distal right coronary artery and posterior descending (coronary) artery, August 2009.  EF 55-60%. Non-ischemic exercise Myoview; EF 50%, October 2009.     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   Procedure: COCHLEAR IMPLANT;  Surgeon: Newman Pies, MD;  Location: [facility];  Service: ENT;  Laterality: Left;   HERNIA REPAIR  80's   Family History  Problem Relation Age of Onset   Alzheimer's disease Mother    Charcot-Marie-Tooth disease Sister    Charcot-Marie-Tooth disease Daughter    Hearing loss Son    Stroke Son    Social History   Socioeconomic History   Marital status: Married    Spouse name: Drinda Butts   Number of children: 5   Years of education: Not on file   Highest education level: Associate degree: occupational, Scientist, product/process development, or vocational program  Occupational History   Occupation: Retired  Tobacco Use   Smoking status: Former    Current packs/day: 1.00    Average packs/day: 1 pack/day for 57.0 years (57.0 ttl pk-yrs)    Types: Cigarettes    Start date: 12/05/1965   Smokeless tobacco: Current   Tobacco comments:    Did quit for a while but started again.  Not as heavy  Vaping Use   Vaping status: Every Day   Substances: Nicotine  Substance and Sexual Activity   Alcohol use: No    Alcohol/week: 0.0 standard drinks of alcohol   Drug use: No   Sexual activity: Yes    Partners: Male  Other Topics Concern   Not on file  Social History Narrative   Married x 52 years  in 2022.   5 children that all live within 30 minutes of patient.   7 grandchildren.   Social Determinants of Health   Financial Resource Strain: Low Risk  (12/12/2022)   Overall Financial Resource Strain (CARDIA)    Difficulty of Paying Living Expenses: Not hard at all  Food Insecurity: No Food Insecurity (12/12/2022)   Hunger Vital Sign    Worried About Running Out of Food in the Last Year: Never true    Ran Out of Food in the Last Year: Never true  Transportation Needs: No Transportation Needs (12/12/2022)   PRAPARE - Administrator, Civil Service (Medical): No    Lack of Transportation (Non-Medical): No  Physical Activity:  Insufficiently Active (12/12/2022)   Exercise Vital Sign    Days of Exercise per Week: 1 day    Minutes of Exercise per Session: 30 min  Stress: No Stress Concern Present (12/12/2022)   [employer] of Occupational Health - Occupational Stress Questionnaire    Feeling of Stress : Not at all  Social Connections: Moderately Integrated (12/12/2022)   Social Connection and Isolation Panel [NHANES]    Frequency of Communication with Friends and Family: More than three times a week    Frequency of Social Gatherings with Friends and Family: More than three times a week    Attends Religious Services: Never    Database administrator or Organizations: Yes    Attends Engineer, structural: More than 4 times per year    Marital Status: Married    Tobacco Counseling Ready to quit: Not Answered Counseling given: Not Answered Tobacco comments: Did quit for a while but started again.  Not as heavy   Clinical Intake:  Pre-visit preparation completed: Yes  Pain : No/denies pain     Nutritional Risks: None Diabetes: Yes CBG done?: No Did pt. bring in CBG monitor from home?: No  How often do you need to have someone help you when you read instructions, pamphlets, or other written materials from your doctor or pharmacy?: 1 -  Never  Interpreter Needed?: No  Information entered by :: Renie Ora, LPN   Activities of Daily Living    12/12/2022   11:43 AM  In your present state of health, do you have any difficulty performing the following activities:  Hearing? 0  Vision? 0  Difficulty concentrating or making decisions? 0  Walking or climbing stairs? 0  Dressing or bathing? 0  Doing errands, shopping? 0  Preparing Food and eating ? N  Using the Toilet? N  In the past six months, have you accidently leaked urine? N  Do you have problems with loss of bowel control? N  Managing your Medications? N  Managing your Finances? N  Housekeeping or managing your Housekeeping? N    Patient Care Team: [name], MD as PCP - General (Family Medicine) Rollene Rotunda, MD as PCP - Cardiology (Cardiology)  Indicate any recent Medical Services you may have received from other than [facility] providers in the past year (date may be approximate).     Assessment:   This is a routine wellness examination for [place].  Hearing/Vision screen Hearing Screening - Comments:: Patient is deaf  Vision Screening - Comments:: Wears rx glasses - up to date with routine eye exams with  [facility]  Dietary issues and exercise activities discussed:     Goals Addressed             This Visit's Progress    DIET - INCREASE WATER INTAKE         Depression Screen    12/12/2022   11:41 AM 07/25/2022   10:43 AM 12/13/2021    9:46 AM 12/13/2021    9:35 AM 05/03/2021    9:48 AM 04/27/2021   10:20 AM 09/27/2020    8:07 AM  PHQ 2/9 Scores  PHQ - 2 Score 0 0 0 0 0 0 0  PHQ- 9 Score      0     Fall Risk    12/12/2022   11:40 AM 07/25/2022   10:43 AM 12/13/2021    9:35 AM 05/03/2021    9:57 AM 04/27/2021   10:20 AM  Fall Risk  Falls in the past year? 0 0 0 0 0  Number falls in past yr: 0   0   Injury with Fall? 0   0   Risk for fall due to : No Fall Risks      Follow up Falls prevention discussed        MEDICARE RISK AT  HOME: Medicare Risk at Home Any stairs in or around the home?: No If so, are there any without handrails?: No Home free of loose throw rugs in walkways, pet beds, electrical cords, etc?: Yes Adequate lighting in your home to reduce risk of falls?: Yes Life alert?: No Use of a cane, walker or w/c?: No Grab bars in the bathroom?: Yes Shower chair or bench in shower?: Yes Elevated toilet seat or a handicapped toilet?: Yes  TIMED UP AND GO:  Was the test performed?  No    Cognitive Function:        12/12/2022   11:43 AM 09/01/2018   10:33 AM  6CIT Screen  What Year? 0 points 0 points  What month? 0 points 0 points  What time? 0 points 0 points  Count back from 20 0 points 0 points  Months in reverse 0 points 0 points  Repeat phrase 0 points 0 points  Total Score 0 points 0 points    Immunizations Immunization History  Administered Date(s) Administered   Pneumococcal Conjugate-13 09/02/2018   Tetanus 04/23/2008    TDAP status: Due, Education has been provided regarding the importance of this vaccine. Advised may receive this vaccine at local pharmacy or Health Dept. Aware to provide a copy of the vaccination record if obtained from local pharmacy or Health Dept. Verbalized acceptance and understanding.  Flu Vaccine status: Declined, Education has been provided regarding the importance of this vaccine but patient still declined. Advised may receive this vaccine at local pharmacy or Health Dept. Aware to provide a copy of the vaccination record if obtained from local pharmacy or Health Dept. Verbalized acceptance and understanding.  Pneumococcal vaccine status: Up to date  Covid-19 vaccine status: Declined, Education has been provided regarding the importance of this vaccine but patient still declined. Advised may receive this vaccine at local pharmacy or Health Dept.or vaccine clinic. Aware to provide a copy of the vaccination record if obtained from local pharmacy or Health  Dept. Verbalized acceptance and understanding.  Qualifies for Shingles Vaccine? Yes   Zostavax completed No   Shingrix Completed?: No.    Education has been provided regarding the importance of this vaccine. Patient has been advised to call insurance company to determine out of pocket expense if they have not yet received this vaccine. Advised may also receive vaccine at local pharmacy or Health Dept. Verbalized acceptance and understanding.  Screening Tests Health Maintenance  Topic Date Due   OPHTHALMOLOGY EXAM  Never done   Colonoscopy  Never done   Lung Cancer Screening  Never done   Zoster Vaccines- Shingrix (1 of 2) Never done   DTaP/Tdap/Td (1 - Tdap) 04/24/2008   FOOT EXAM  09/27/2021   COVID-19 Vaccine (1 - 2023-24 season) Never done   INFLUENZA VACCINE  11/22/2022   Pneumonia Vaccine 61+ Years old (2 of 2 - PPSV23 or PCV20) 07/25/2023 (Originally 09/02/2019)   HEMOGLOBIN A1C  01/24/2023   Diabetic kidney evaluation - Urine ACR  04/10/2023   Diabetic kidney evaluation - eGFR measurement  07/25/2023   Medicare Annual Wellness (AWV)  12/12/2023   Hepatitis C Screening  Completed  HPV VACCINES  Aged Out    Health Maintenance  Health Maintenance Due  Topic Date Due   OPHTHALMOLOGY EXAM  Never done   Colonoscopy  Never done   Lung Cancer Screening  Never done   Zoster Vaccines- Shingrix (1 of 2) Never done   DTaP/Tdap/Td (1 - Tdap) 04/24/2008   FOOT EXAM  09/27/2021   COVID-19 Vaccine (1 - 2023-24 season) Never done   INFLUENZA VACCINE  11/22/2022    Colorectal cancer screening: Referral to GI placed declined . Pt aware the office will call re: appt.  Lung Cancer Screening: (Low Dose CT Chest recommended if Age 88-80 years, 20 pack-year currently smoking OR have quit w/in 15years.) does qualify.   Lung Cancer Screening Referral: declined   Additional Screening:  Hepatitis C Screening: does not qualify; Completed 01/26/2019  Vision Screening: Recommended annual  ophthalmology exams for early detection of glaucoma and other disorders of the eye. Is the patient up to date with their annual eye exam?  Yes  Who is the provider or what is the name of the office in which the patient attends annual eye exams? [facility] If pt is not established with a provider, would they like to be referred to a provider to establish care? No .   Dental Screening: Recommended annual dental exams for proper oral hygiene   Community Resource Referral / Chronic Care Management: CRR required this visit?  No   CCM required this visit?  No     Plan:     I have personally reviewed and noted the following in the patient's chart:   Medical and social history Use of alcohol, tobacco or illicit drugs  Current medications and supplements including opioid prescriptions. Patient is not currently taking opioid prescriptions. Functional ability and status Nutritional status Physical activity Advanced directives List of other physicians Hospitalizations, surgeries, and ER visits in previous 12 months Vitals Screenings to include cognitive, depression, and falls Referrals and appointments  In addition, I have reviewed and discussed with patient certain preventive protocols, quality metrics, and best practice recommendations. A written personalized care plan for preventive services as well as general preventive health recommendations were provided to patient.     Lorrene Reid, LPN   1/61/0960   After Visit Summary: (MyChart) Due to this being a telephonic visit, the after visit summary with patients personalized plan was offered to patient via MyChart   Nurse Notes: none

## 2022-12-27 NOTE — Progress Notes (Signed)
[facility] Quality Team Note  Name: Franklin [name] Date of Birth: 1950/05/29 MRN: [id number] Date: 12/27/2022  [facility] Quality Team has reviewed this patient's chart, please see recommendations below:  Diabetic Retinal Eye Exam; Patient requests [facility] Quality Coordinator to schedule Diabetic Retinal Screening at [facility] event 01/02/2023 9:30am).

## 2023-01-09 ENCOUNTER — Ambulatory Visit: Payer: Medicare Other

## 2023-01-09 DIAGNOSIS — E1140 Type 2 diabetes mellitus with diabetic neuropathy, unspecified: Secondary | ICD-10-CM

## 2023-01-09 LAB — HM DIABETES EYE EXAM

## 2023-01-09 NOTE — Progress Notes (Signed)
Franklin [name] arrived 01/09/2023 and has given verbal consent to obtain images and complete their overdue diabetic retinal screening.  The images have been sent to an ophthalmologist or optometrist for review and interpretation.  Results will be sent back to [name], MD for review.  Patient has been informed they will be contacted when we receive the results via telephone or MyChart

## 2023-01-24 ENCOUNTER — Ambulatory Visit: Payer: Medicare Other | Admitting: Family Medicine

## 2023-01-25 ENCOUNTER — Other Ambulatory Visit: Payer: Self-pay | Admitting: Family Medicine

## 2023-01-25 DIAGNOSIS — Z1211 Encounter for screening for malignant neoplasm of colon: Secondary | ICD-10-CM

## 2023-01-25 DIAGNOSIS — Z1212 Encounter for screening for malignant neoplasm of rectum: Secondary | ICD-10-CM

## 2023-01-31 ENCOUNTER — Other Ambulatory Visit: Payer: Self-pay

## 2023-01-31 DIAGNOSIS — E1140 Type 2 diabetes mellitus with diabetic neuropathy, unspecified: Secondary | ICD-10-CM

## 2023-03-06 ENCOUNTER — Ambulatory Visit: Payer: Medicare Other | Admitting: Family Medicine

## 2023-03-06 ENCOUNTER — Encounter: Payer: Self-pay | Admitting: Family Medicine

## 2023-03-06 VITALS — BP 139/73 | HR 69 | Temp 98.0°F | Ht 76.0 in | Wt 187.2 lb

## 2023-03-06 DIAGNOSIS — Z7984 Long term (current) use of oral hypoglycemic drugs: Secondary | ICD-10-CM

## 2023-03-06 DIAGNOSIS — E1140 Type 2 diabetes mellitus with diabetic neuropathy, unspecified: Secondary | ICD-10-CM | POA: Diagnosis not present

## 2023-03-06 DIAGNOSIS — N529 Male erectile dysfunction, unspecified: Secondary | ICD-10-CM | POA: Diagnosis not present

## 2023-03-06 DIAGNOSIS — I10 Essential (primary) hypertension: Secondary | ICD-10-CM

## 2023-03-06 DIAGNOSIS — E782 Mixed hyperlipidemia: Secondary | ICD-10-CM | POA: Diagnosis not present

## 2023-03-06 LAB — BAYER DCA HB A1C WAIVED: HB A1C (BAYER DCA - WAIVED): 7 % — ABNORMAL HIGH (ref 4.8–5.6)

## 2023-03-06 MED ORDER — SILDENAFIL CITRATE 20 MG PO TABS: ORAL_TABLET | ORAL | 5 refills | Status: DC

## 2023-03-06 NOTE — Progress Notes (Signed)
Subjective:  Patient ID: Franklin [name],  male    DOB: 12/02/1950  Age: 72 y.o.    CC: Medical Management of Chronic Issues   HPI CLOUD OBERBROECKLING presents for  follow-up of hypertension. Patient has no history of headache chest pain or shortness of breath or recent cough. Patient also denies symptoms of TIA such as numbness weakness lateralizing. Patient denies side effects from medication. States taking it regularly.  Patient also  in for follow-up of elevated cholesterol. Doing well without complaints on current medication. Denies side effects  including myalgia and arthralgia and nausea. Also in today for liver function testing. Currently no chest pain, shortness of breath or other cardiovascular related symptoms noted.  Follow-up of diabetes. Patient does check blood sugar at home. Readings run between 100 and 150 Patient denies symptoms such as excessive hunger or urinary frequency, excessive hunger, nausea No significant hypoglycemic spells noted. Medications reviewed. Pt reports taking them regularly. Pt. denies complication/adverse reaction today.    History Shaquell has a past medical history of Charcot-Marie-Tooth disease, COPD (chronic obstructive pulmonary disease) (HCC), Coronary artery disease (2009), Coronary atherosclerosis of native coronary artery, Hearing loss, Other and unspecified hyperlipidemia, Peripheral neuropathy, Statin intolerance, and Type 2 diabetes mellitus (HCC).   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   His family history includes Alzheimer's disease in his mother; Charcot-Marie-Tooth disease in his daughter and sister; Hearing loss in his son; Stroke in his son.He reports that he has quit smoking. His smoking use included cigarettes. He started smoking about 57 years ago. He has a 57.3 pack-year smoking history. He uses smokeless tobacco. He  reports that he does not drink alcohol and does not use drugs.  Current Outpatient Medications on File Prior to Visit  Medication Sig Dispense Refill   aspirin EC 81 MG tablet Take 81 mg by mouth daily.     Blood Glucose Monitoring Suppl (ONE TOUCH ULTRA 2) w/Device KIT Test sugars 3 times daily 1 each 0   clopidogrel (PLAVIX) 75 MG tablet Take 1 tablet (75 mg total) by mouth daily. 90 tablet 3   glucose blood (ONE TOUCH TEST STRIPS) test strip Test 3 times daily   E11.9 100 each 12   metFORMIN (GLUCOPHAGE) 500 MG tablet Take 1 tablet (500 mg total) by mouth 2 (two) times daily with a meal. 180 tablet 3   nitroGLYCERIN (NITROSTAT) 0.4 MG SL tablet Place 1 tablet (0.4 mg total) under the tongue every 5 (five) minutes as needed for chest pain. 75 tablet 3   ONE TOUCH LANCETS MISC 1 each by Does not apply route 2 (two) times daily. 100 each 11   No current facility-administered medications on file prior to visit.    ROS Review of Systems  Constitutional:  Negative for fever.  Respiratory:  Negative for shortness of breath.   Cardiovascular:  Negative for chest pain.  Musculoskeletal:  Negative for arthralgias.  Skin:  Negative for rash.    Objective:  BP 139/73   Pulse 69   Temp 98 F (36.7 C)   Ht 6\' 4"  (1.93 m)   Wt 187 lb 3.2 oz (84.9 kg)   SpO2 97%   BMI 22.79 kg/m   BP Readings from Last 3 Encounters:  03/06/23 139/73  07/25/22 129/74  12/20/21 120/70    Wt Readings from Last 3 Encounters:  03/06/23 187 lb 3.2 oz (84.9 kg)  12/12/22  180 lb (81.6 kg)  07/25/22 184 lb 9.6 oz (83.7 kg)     Physical Exam Vitals reviewed.  Constitutional:      Appearance: He is well-developed.  HENT:     Head: Normocephalic and atraumatic.     Right Ear: External ear normal.     Left Ear: External ear normal.     Mouth/Throat:     Pharynx: No oropharyngeal exudate or posterior oropharyngeal erythema.  Eyes:     Pupils: Pupils are equal, round, and reactive to light.   Cardiovascular:     Rate and Rhythm: Normal rate and regular rhythm.     Heart sounds: No murmur heard. Pulmonary:     Effort: No respiratory distress.     Breath sounds: Normal breath sounds.  Musculoskeletal:     Cervical back: Normal range of motion and neck supple.  Neurological:     Mental Status: He is alert and oriented to person, place, and time.     Diabetic Foot Exam - Simple   No data filed     Lab Results  Component Value Date   HGBA1C 7.0 (H) 03/06/2023   HGBA1C 6.3 (H) 07/25/2022   HGBA1C 6.4 (H) 12/13/2021    Assessment & Plan:   Ivis was seen today for medical management of chronic issues.  Diagnoses and all orders for this visit:  Type 2 diabetes mellitus with diabetic neuropathy, without long-term current use of insulin (HCC) -     Bayer DCA Hb A1c Waived -     Microalbumin / creatinine urine ratio  Essential hypertension -     CBC with Differential/Platelet -     CMP14+EGFR  Mixed hyperlipidemia -     Lipid panel  Vasculogenic erectile dysfunction, unspecified vasculogenic erectile dysfunction type -     sildenafil (REVATIO) 20 MG tablet; Take 2-5 pills at once, orally, with each sexual encounter   I am having [name] maintain his aspirin EC, ONE TOUCH LANCETS, glucose blood, ONE TOUCH ULTRA 2, metFORMIN, nitroGLYCERIN, clopidogrel, and sildenafil.  Meds ordered this encounter  Medications   sildenafil (REVATIO) 20 MG tablet    Sig: Take 2-5 pills at once, orally, with each sexual encounter    Dispense:  50 tablet    Refill:  5     Follow-up: Return in about 3 months (around 06/06/2023).  [name], M.D.

## 2023-03-07 LAB — CBC WITH DIFFERENTIAL/PLATELET
Basophils Absolute: 0 10*3/uL (ref 0.0–0.2)
Basos: 0 %
EOS (ABSOLUTE): 0.1 10*3/uL (ref 0.0–0.4)
Eos: 1 %
Hematocrit: 45.2 % (ref 37.5–51.0)
Hemoglobin: 15.3 g/dL (ref 13.0–17.7)
Immature Grans (Abs): 0 10*3/uL (ref 0.0–0.1)
Immature Granulocytes: 0 %
Lymphocytes Absolute: 1.9 10*3/uL (ref 0.7–3.1)
Lymphs: 31 %
MCH: 31.2 pg (ref 26.6–33.0)
MCHC: 33.8 g/dL (ref 31.5–35.7)
MCV: 92 fL (ref 79–97)
Monocytes Absolute: 0.6 10*3/uL (ref 0.1–0.9)
Monocytes: 10 %
Neutrophils Absolute: 3.4 10*3/uL (ref 1.4–7.0)
Neutrophils: 58 %
Platelets: 227 10*3/uL (ref 150–450)
RBC: 4.91 x10E6/uL (ref 4.14–5.80)
RDW: 12 % (ref 11.6–15.4)
WBC: 6 10*3/uL (ref 3.4–10.8)

## 2023-03-07 LAB — CMP14+EGFR
ALT: 40 IU/L (ref 0–44)
AST: 30 [IU]/L (ref 0–40)
Albumin: 4.6 g/dL (ref 3.8–4.8)
Alkaline Phosphatase: 58 IU/L (ref 44–121)
BUN/Creatinine Ratio: 14 (ref 10–24)
BUN: 20 mg/dL (ref 8–27)
Bilirubin Total: 0.6 mg/dL (ref 0.0–1.2)
CO2: 25 mmol/L (ref 20–29)
Calcium: 10.2 mg/dL (ref 8.6–10.2)
Chloride: 102 mmol/L (ref 96–106)
Creatinine, Ser: 1.38 mg/dL — ABNORMAL HIGH (ref 0.76–1.27)
Globulin, Total: 2.7 g/dL (ref 1.5–4.5)
Glucose: 127 mg/dL — ABNORMAL HIGH (ref 70–99)
Potassium: 5 mmol/L (ref 3.5–5.2)
Sodium: 142 mmol/L (ref 134–144)
Total Protein: 7.3 g/dL (ref 6.0–8.5)
eGFR: 54 mL/min/{1.73_m2} — ABNORMAL LOW (ref >59)

## 2023-03-07 LAB — LIPID PANEL
Chol/HDL Ratio: 5.6 ratio — ABNORMAL HIGH (ref 0.0–5.0)
Cholesterol, Total: 189 mg/dL (ref 100–199)
HDL: 34 mg/dL — ABNORMAL LOW (ref >39)
LDL Chol Calc (NIH): 105 mg/dL — ABNORMAL HIGH (ref 0–99)
Triglycerides: 289 mg/dL — ABNORMAL HIGH (ref 0–149)
VLDL Cholesterol Cal: 50 mg/dL — ABNORMAL HIGH (ref 5–40)

## 2023-03-08 ENCOUNTER — Encounter: Payer: Self-pay | Admitting: Family Medicine

## 2023-04-28 NOTE — Progress Notes (Signed)
  Cardiology Office Note:   Date:  05/01/2023  ID:  Franklin [name], DOB October 13, 1950, MRN [id number] PCP: Zollie Lowers, MD  [facility] Providers Cardiologist:  Lynwood Schilling, MD {  History of Present Illness:   Franklin [name] is a 73 y.o. male who presents for follow up of CAD.   He returns for routine follow-up.  Since I last saw him he has done well.  He has some chronic dyspnea with exertion which is unchanged and he says related to his previous smoking.  He can still go fishing and hunting.  He actually took his wife duck hunting. The patient denies any new symptoms such as chest discomfort, neck or arm discomfort. There has been no new shortness of breath, PND or orthopnea. There have been no reported palpitations, presyncope or syncope.   ROS: As stated in the HPI and negative for all other systems.  Studies Reviewed:    EKG:   EKG Interpretation Date/Time:  Wednesday May 01 2023 11:40:23 EST Ventricular Rate:  81 PR Interval:  144 QRS Duration:  90 QT Interval:  364 QTC Calculation: 422 R Axis:   -83  Text Interpretation: Normal sinus rhythm Left axis deviation When compared with ECG of 04-Dec-2007 08:04, No significant change was found Confirmed by Schilling Lynwood ([id number]) on 05/01/2023 11:50:28 AM    Risk Assessment/Calculations:         Physical Exam:   VS:  BP (!) 140/70   Pulse 81   Ht 6' 5 (1.956 m)   Wt 189 lb (85.7 kg)   BMI 22.41 kg/m    Wt Readings from Last 3 Encounters:  05/01/23 189 lb (85.7 kg)  03/06/23 187 lb 3.2 oz (84.9 kg)  12/12/22 180 lb (81.6 kg)     GEN: Well nourished, well developed in no acute distress NECK: No JVD; No carotid bruits CARDIAC: RRR, no murmurs, rubs, gallops RESPIRATORY:  Clear to auscultation without rales, wheezing or rhonchi  ABDOMEN: Soft, non-tender, non-distended EXTREMITIES:  No edema; No deformity   ASSESSMENT AND PLAN:   CAD:  The patient has no new sypmtoms.  No further cardiovascular testing is  indicated.  We will continue with aggressive risk reduction and meds as listed.   DM:    A1C was 5.9 which is down from 6.4.  No change in therapy.    HTN: His blood pressure is at target.  No change in therapy.    DYSLIPIDEMIA:  LDL was 105.  He does not tolerate statins and would prefer not to take PCSK9i  TOBACCO:   He stopped smoking 2 years ago.  Although he previously told me he smokes once in a great while and I have told him about complete abstinence.     Follow up with me in 1 year  Signed, Lynwood Schilling, MD

## 2023-05-01 ENCOUNTER — Encounter: Payer: Self-pay | Admitting: Cardiology

## 2023-05-01 ENCOUNTER — Ambulatory Visit: Payer: Medicare Other | Admitting: Cardiology

## 2023-05-01 VITALS — BP 140/70 | HR 81 | Ht 77.0 in | Wt 189.0 lb

## 2023-05-01 DIAGNOSIS — Z720 Tobacco use: Secondary | ICD-10-CM | POA: Diagnosis not present

## 2023-05-01 DIAGNOSIS — I10 Essential (primary) hypertension: Secondary | ICD-10-CM | POA: Diagnosis not present

## 2023-05-01 DIAGNOSIS — E785 Hyperlipidemia, unspecified: Secondary | ICD-10-CM | POA: Diagnosis not present

## 2023-05-01 DIAGNOSIS — I25118 Atherosclerotic heart disease of native coronary artery with other forms of angina pectoris: Secondary | ICD-10-CM | POA: Diagnosis not present

## 2023-05-01 NOTE — Patient Instructions (Signed)

## 2023-05-21 ENCOUNTER — Other Ambulatory Visit: Payer: Self-pay | Admitting: Family Medicine

## 2023-05-21 DIAGNOSIS — I25118 Atherosclerotic heart disease of native coronary artery with other forms of angina pectoris: Secondary | ICD-10-CM

## 2023-06-04 ENCOUNTER — Other Ambulatory Visit: Payer: Self-pay | Admitting: Family Medicine

## 2023-06-04 DIAGNOSIS — E1140 Type 2 diabetes mellitus with diabetic neuropathy, unspecified: Secondary | ICD-10-CM

## 2023-06-12 ENCOUNTER — Ambulatory Visit: Payer: Medicare Other | Admitting: Family Medicine

## 2023-07-09 ENCOUNTER — Encounter: Payer: Self-pay | Admitting: Family Medicine

## 2023-07-09 ENCOUNTER — Ambulatory Visit: Payer: Medicare Other | Admitting: Family Medicine

## 2023-07-09 DIAGNOSIS — E1140 Type 2 diabetes mellitus with diabetic neuropathy, unspecified: Secondary | ICD-10-CM

## 2023-07-09 DIAGNOSIS — E782 Mixed hyperlipidemia: Secondary | ICD-10-CM

## 2023-07-09 DIAGNOSIS — I25118 Atherosclerotic heart disease of native coronary artery with other forms of angina pectoris: Secondary | ICD-10-CM

## 2023-07-09 DIAGNOSIS — I10 Essential (primary) hypertension: Secondary | ICD-10-CM

## 2023-07-29 ENCOUNTER — Other Ambulatory Visit: Payer: Self-pay | Admitting: Family Medicine

## 2023-07-29 DIAGNOSIS — I25118 Atherosclerotic heart disease of native coronary artery with other forms of angina pectoris: Secondary | ICD-10-CM

## 2023-08-09 ENCOUNTER — Encounter: Payer: Self-pay | Admitting: Family Medicine

## 2023-08-09 DIAGNOSIS — S50862A Insect bite (nonvenomous) of left forearm, initial encounter: Secondary | ICD-10-CM | POA: Diagnosis not present

## 2023-08-13 ENCOUNTER — Other Ambulatory Visit: Payer: Self-pay | Admitting: Family Medicine

## 2023-08-13 DIAGNOSIS — E1140 Type 2 diabetes mellitus with diabetic neuropathy, unspecified: Secondary | ICD-10-CM

## 2023-08-28 ENCOUNTER — Encounter: Payer: Self-pay | Admitting: Family Medicine

## 2023-08-28 ENCOUNTER — Ambulatory Visit: Admitting: Family Medicine

## 2023-08-28 VITALS — BP 138/80 | HR 94 | Temp 98.5°F | Ht 77.0 in | Wt 181.0 lb

## 2023-08-28 DIAGNOSIS — E782 Mixed hyperlipidemia: Secondary | ICD-10-CM | POA: Diagnosis not present

## 2023-08-28 DIAGNOSIS — I10 Essential (primary) hypertension: Secondary | ICD-10-CM

## 2023-08-28 DIAGNOSIS — I25118 Atherosclerotic heart disease of native coronary artery with other forms of angina pectoris: Secondary | ICD-10-CM

## 2023-08-28 DIAGNOSIS — Z7984 Long term (current) use of oral hypoglycemic drugs: Secondary | ICD-10-CM

## 2023-08-28 DIAGNOSIS — E1140 Type 2 diabetes mellitus with diabetic neuropathy, unspecified: Secondary | ICD-10-CM | POA: Diagnosis not present

## 2023-08-28 DIAGNOSIS — N529 Male erectile dysfunction, unspecified: Secondary | ICD-10-CM

## 2023-08-28 LAB — BAYER DCA HB A1C WAIVED: HB A1C (BAYER DCA - WAIVED): 6.6 % — ABNORMAL HIGH (ref 4.8–5.6)

## 2023-08-28 LAB — LIPID PANEL

## 2023-08-28 MED ORDER — METFORMIN HCL 500 MG PO TABS: 500.0000 mg | ORAL_TABLET | Freq: Two times a day (BID) | ORAL | 0 refills | Status: DC

## 2023-08-28 MED ORDER — SILDENAFIL CITRATE 20 MG PO TABS: ORAL_TABLET | ORAL | 5 refills | Status: AC

## 2023-08-28 MED ORDER — CLOPIDOGREL BISULFATE 75 MG PO TABS: 75.0000 mg | ORAL_TABLET | Freq: Every day | ORAL | 0 refills | Status: AC

## 2023-08-28 NOTE — Progress Notes (Signed)
 Subjective:  Patient ID: Franklin [name],  male    DOB: August 19, 1950  Age: 73 y.o.    CC: Medical Management of Chronic Issues (Would like feet looked at for neuropathy)   HPI Franklin [name] presents for  follow-up of hypertension. Patient has no history of headache chest pain or shortness of breath or recent cough. Patient also denies symptoms of TIA such as numbness weakness lateralizing. Patient denies side effects from medication. States taking it regularly.  Patient also  in for follow-up of elevated cholesterol. Doing well without complaints on current medication. Denies side effects  including myalgia and arthralgia and nausea. Also in today for liver function testing. Currently no chest pain, shortness of breath or other cardiovascular related symptoms noted.  Follow-up of diabetes. Patient does not check blood sugar at home. Patient denies symptoms such as excessive hunger or urinary frequency, excessive hunger, nausea No significant hypoglycemic spells noted. Medications reviewed. Pt reports taking them regularly. Pt. denies complication/adverse reaction today.   History of coronary artery disease.  No recent chest pain or shortness of breath episodes noted.   Erectile dysfunction responds adequately to medication.   History Franklin [name] has a past medical history of Charcot-Marie-Tooth disease, COPD (chronic obstructive pulmonary disease) (HCC), Coronary artery disease (2009), Coronary atherosclerosis of native coronary artery, Hearing loss, Other and unspecified hyperlipidemia, Peripheral neuropathy, Statin intolerance, and Type 2 diabetes mellitus (HCC).   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   His family history includes Alzheimer's disease in his mother; Charcot-Marie-Tooth disease in his daughter and sister; Hearing loss in his son; Stroke in his son.He reports  that he has quit smoking. His smoking use included cigarettes. He started smoking about 57 years ago. He has a 57.7 pack-year smoking history. He uses smokeless tobacco. He reports that he does not drink alcohol and does not use drugs.  Current Outpatient Medications on File Prior to Visit  Medication Sig Dispense Refill   aspirin EC 81 MG tablet Take 81 mg by mouth daily.     Blood Glucose Monitoring Suppl (ONE TOUCH ULTRA 2) w/Device KIT Test sugars 3 times daily 1 each 0   glucose blood (ONE TOUCH TEST STRIPS) test strip Test 3 times daily   E11.9 100 each 12   nitroGLYCERIN  (NITROSTAT ) 0.4 MG SL tablet Place 1 tablet (0.4 mg total) under the tongue every 5 (five) minutes as needed for chest pain. 75 tablet 3   ONE TOUCH LANCETS MISC 1 each by Does not apply route 2 (two) times daily. 100 each 11   No current facility-administered medications on file prior to visit.    ROS Review of Systems  Constitutional: Negative.   HENT: Negative.    Eyes:  Negative for visual disturbance.  Respiratory:  Negative for cough and shortness of breath.   Cardiovascular:  Negative for chest pain and leg swelling.  Gastrointestinal:  Negative for abdominal pain, diarrhea, nausea and vomiting.  Genitourinary:  Negative for difficulty urinating.  Musculoskeletal:  Negative for arthralgias and myalgias.  Skin:  Negative for rash.  Neurological:  Negative for headaches.  Psychiatric/Behavioral:  Negative for sleep disturbance.     Objective:  BP 138/80   Pulse 94   Temp 98.5 F (36.9 C)   Ht 6\' 5"  (1.956 m)   Wt 181 lb (82.1 kg)   SpO2 97%   BMI 21.46 kg/m   BP Readings from Last  3 Encounters:  08/28/23 138/80  05/01/23 (!) 140/70  03/06/23 139/73    Wt Readings from Last 3 Encounters:  08/28/23 181 lb (82.1 kg)  05/01/23 189 lb (85.7 kg)  03/06/23 187 lb 3.2 oz (84.9 kg)    Lab Results  Component Value Date   HGBA1C 7.0 (H) 03/06/2023   HGBA1C 6.3 (H) 07/25/2022   HGBA1C 6.4 (H)  12/13/2021    Physical Exam Vitals reviewed.  Constitutional:      Appearance: He is well-developed.  HENT:     Head: Normocephalic and atraumatic.     Right Ear: External ear normal.     Left Ear: External ear normal.     Mouth/Throat:     Pharynx: No oropharyngeal exudate or posterior oropharyngeal erythema.  Eyes:     Pupils: Pupils are equal, round, and reactive to light.  Cardiovascular:     Rate and Rhythm: Normal rate and regular rhythm.     Heart sounds: No murmur heard. Pulmonary:     Effort: No respiratory distress.     Breath sounds: Normal breath sounds.  Musculoskeletal:     Cervical back: Normal range of motion and neck supple.  Neurological:     Mental Status: He is alert and oriented to person, place, and time.         Assessment & Plan:  Type 2 diabetes mellitus with diabetic neuropathy, without long-term current use of insulin (HCC) -     Bayer DCA Hb A1c Waived -     Microalbumin / creatinine urine ratio -     metFORMIN  HCl; Take 1 tablet (500 mg total) by mouth 2 (two) times daily with a meal.  Dispense: 200 tablet; Refill: 0  Essential hypertension -     CMP14+EGFR  Mixed hyperlipidemia -     Lipid panel  Atherosclerosis of native coronary artery of native heart with stable angina pectoris (HCC) -     CMP14+EGFR -     Clopidogrel  Bisulfate; Take 1 tablet (75 mg total) by mouth daily.  Dispense: 100 tablet; Refill: 0  Vasculogenic erectile dysfunction, unspecified vasculogenic erectile dysfunction type -     Sildenafil  Citrate; Take 2-5 pills at once, orally, with each sexual encounter  Dispense: 50 tablet; Refill: 5    Follow-up: No follow-ups on file.  Franklin [name], M.D.

## 2023-08-29 ENCOUNTER — Encounter: Payer: Self-pay | Admitting: Family Medicine

## 2023-08-29 LAB — CMP14+EGFR
ALT: 29 IU/L (ref 0–44)
AST: 23 IU/L (ref 0–40)
Albumin: 4.6 g/dL (ref 3.8–4.8)
Alkaline Phosphatase: 63 IU/L (ref 44–121)
BUN/Creatinine Ratio: 16 (ref 10–24)
BUN: 23 mg/dL (ref 8–27)
Bilirubin Total: 0.6 mg/dL (ref 0.0–1.2)
CO2: 21 mmol/L (ref 20–29)
Calcium: 10.1 mg/dL (ref 8.6–10.2)
Chloride: 98 mmol/L (ref 96–106)
Creatinine, Ser: 1.44 mg/dL — ABNORMAL HIGH (ref 0.76–1.27)
Globulin, Total: 2.6 g/dL (ref 1.5–4.5)
Glucose: 162 mg/dL — ABNORMAL HIGH (ref 70–99)
Potassium: 4.8 mmol/L (ref 3.5–5.2)
Sodium: 138 mmol/L (ref 134–144)
Total Protein: 7.2 g/dL (ref 6.0–8.5)
eGFR: 51 mL/min/{1.73_m2} — ABNORMAL LOW (ref >59)

## 2023-08-29 LAB — LIPID PANEL
Cholesterol, Total: 172 mg/dL (ref 100–199)
HDL: 35 mg/dL — ABNORMAL LOW (ref >39)
LDL CALC COMMENT:: 4.9 ratio (ref 0.0–5.0)
LDL Chol Calc (NIH): 95 mg/dL (ref 0–99)
Triglycerides: 246 mg/dL — ABNORMAL HIGH (ref 0–149)
VLDL Cholesterol Cal: 42 mg/dL — ABNORMAL HIGH (ref 5–40)

## 2023-08-29 LAB — MICROALBUMIN / CREATININE URINE RATIO
Creatinine, Urine: 104.8 mg/dL
Microalb/Creat Ratio: 407 mg/g{creat} — ABNORMAL HIGH (ref 0–29)
Microalbumin, Urine: 426.3 ug/mL

## 2023-09-01 ENCOUNTER — Encounter: Payer: Self-pay | Admitting: Family Medicine

## 2023-09-01 ENCOUNTER — Other Ambulatory Visit: Payer: Self-pay | Admitting: Family Medicine

## 2023-09-01 MED ORDER — LISINOPRIL 10 MG PO TABS: 10.0000 mg | ORAL_TABLET | Freq: Every day | ORAL | 3 refills | Status: AC

## 2023-10-08 ENCOUNTER — Ambulatory Visit: Payer: Self-pay

## 2023-10-08 NOTE — Telephone Encounter (Signed)
 FYI Only or Action Required?: FYI only for provider  Patient was last seen in primary care on 08/28/2023 by Franklin Cleaver, MD. Called Nurse Triage reporting Hematuria. Symptoms began today. Interventions attempted: Nothing. Symptoms are: stable.  Triage Disposition: See Physician Within 24 Hours  Patient/caregiver understands and will follow disposition?: Yes  Pt's wife states no sx other than hematuria, started today. Scheduled OV tomorrow at 255, advised if sx get worse to call back or go to ED to be seen.   Copied from CRM [id number]. Topic: Clinical - Red Word Triage >> Oct 08, 2023 10:30 AM Star [place] wrote: Red Word that prompted transfer to Nurse Triage: blood in urine Reason for Disposition  Blood in urine  (Exception: Could be normal menstrual bleeding.)  Answer Assessment - Initial Assessment Questions 1. COLOR of URINE: Describe the color of the urine.  (e.g., tea-colored, pink, red, bloody) Do you have blood clots in your urine? (e.g., none, pea, grape, small coin)     Bright red colored  2. ONSET: When did the bleeding start?      Today  3. EPISODES: How many times has there been blood in the urine? or How many times today?     3 4. PAIN with URINATION: Is there any pain with passing your urine? If Yes, ask: How bad is the pain?  (Scale 1-10; or mild, moderate, severe)    - MILD: Complains slightly about urination hurting.    - MODERATE: Interferes with normal activities.      - SEVERE: Excruciating, unwilling or unable to urinate because of the pain.      no 5. FEVER: Do you have a fever? If Yes, ask: What is your temperature, how was it measured, and when did it start?     no 6. ASSOCIATED SYMPTOMS: Are you passing urine more frequently than usual?     no 7. OTHER SYMPTOMS: Do you have any other symptoms? (e.g., back/flank pain, abdomen pain, vomiting)     no  Protocols used: Urine - Blood In-A-AH

## 2023-10-09 ENCOUNTER — Other Ambulatory Visit: Payer: Self-pay

## 2023-10-09 ENCOUNTER — Ambulatory Visit: Admitting: Family Medicine

## 2023-10-09 ENCOUNTER — Emergency Department (HOSPITAL_BASED_OUTPATIENT_CLINIC_OR_DEPARTMENT_OTHER)
Admission: EM | Admit: 2023-10-09 | Discharge: 2023-10-09 | Disposition: A | Discharge status: Home / Self Care | Source: Home / Self Care | Attending: Emergency Medicine | Admitting: Emergency Medicine

## 2023-10-09 ENCOUNTER — Emergency Department (HOSPITAL_BASED_OUTPATIENT_CLINIC_OR_DEPARTMENT_OTHER)

## 2023-10-09 ENCOUNTER — Telehealth: Payer: Self-pay

## 2023-10-09 ENCOUNTER — Encounter (HOSPITAL_BASED_OUTPATIENT_CLINIC_OR_DEPARTMENT_OTHER): Payer: Self-pay | Admitting: Emergency Medicine

## 2023-10-09 DIAGNOSIS — Z7982 Long term (current) use of aspirin: Secondary | ICD-10-CM | POA: Insufficient documentation

## 2023-10-09 DIAGNOSIS — Z955 Presence of coronary angioplasty implant and graft: Secondary | ICD-10-CM | POA: Diagnosis not present

## 2023-10-09 DIAGNOSIS — I2510 Atherosclerotic heart disease of native coronary artery without angina pectoris: Secondary | ICD-10-CM | POA: Insufficient documentation

## 2023-10-09 DIAGNOSIS — E1122 Type 2 diabetes mellitus with diabetic chronic kidney disease: Secondary | ICD-10-CM | POA: Diagnosis not present

## 2023-10-09 DIAGNOSIS — Z7984 Long term (current) use of oral hypoglycemic drugs: Secondary | ICD-10-CM | POA: Insufficient documentation

## 2023-10-09 DIAGNOSIS — I714 Abdominal aortic aneurysm, without rupture, unspecified: Secondary | ICD-10-CM | POA: Diagnosis not present

## 2023-10-09 DIAGNOSIS — Z79899 Other long term (current) drug therapy: Secondary | ICD-10-CM | POA: Insufficient documentation

## 2023-10-09 DIAGNOSIS — J449 Chronic obstructive pulmonary disease, unspecified: Secondary | ICD-10-CM | POA: Insufficient documentation

## 2023-10-09 DIAGNOSIS — I10 Essential (primary) hypertension: Secondary | ICD-10-CM | POA: Insufficient documentation

## 2023-10-09 DIAGNOSIS — J4489 Other specified chronic obstructive pulmonary disease: Secondary | ICD-10-CM | POA: Diagnosis not present

## 2023-10-09 DIAGNOSIS — I7143 Infrarenal abdominal aortic aneurysm, without rupture: Secondary | ICD-10-CM | POA: Diagnosis not present

## 2023-10-09 DIAGNOSIS — E860 Dehydration: Secondary | ICD-10-CM | POA: Diagnosis not present

## 2023-10-09 DIAGNOSIS — N12 Tubulo-interstitial nephritis, not specified as acute or chronic: Secondary | ICD-10-CM | POA: Diagnosis not present

## 2023-10-09 DIAGNOSIS — N202 Calculus of kidney with calculus of ureter: Secondary | ICD-10-CM | POA: Diagnosis not present

## 2023-10-09 DIAGNOSIS — Z7902 Long term (current) use of antithrombotics/antiplatelets: Secondary | ICD-10-CM | POA: Diagnosis not present

## 2023-10-09 DIAGNOSIS — N10 Acute tubulo-interstitial nephritis: Secondary | ICD-10-CM | POA: Diagnosis not present

## 2023-10-09 DIAGNOSIS — E785 Hyperlipidemia, unspecified: Secondary | ICD-10-CM | POA: Diagnosis not present

## 2023-10-09 DIAGNOSIS — F17210 Nicotine dependence, cigarettes, uncomplicated: Secondary | ICD-10-CM | POA: Diagnosis not present

## 2023-10-09 DIAGNOSIS — N1831 Chronic kidney disease, stage 3a: Secondary | ICD-10-CM | POA: Diagnosis not present

## 2023-10-09 DIAGNOSIS — I252 Old myocardial infarction: Secondary | ICD-10-CM | POA: Diagnosis not present

## 2023-10-09 DIAGNOSIS — E1142 Type 2 diabetes mellitus with diabetic polyneuropathy: Secondary | ICD-10-CM | POA: Diagnosis not present

## 2023-10-09 DIAGNOSIS — R310 Gross hematuria: Secondary | ICD-10-CM | POA: Diagnosis not present

## 2023-10-09 DIAGNOSIS — N179 Acute kidney failure, unspecified: Secondary | ICD-10-CM | POA: Diagnosis not present

## 2023-10-09 DIAGNOSIS — Z823 Family history of stroke: Secondary | ICD-10-CM | POA: Diagnosis not present

## 2023-10-09 DIAGNOSIS — K567 Ileus, unspecified: Secondary | ICD-10-CM | POA: Diagnosis not present

## 2023-10-09 DIAGNOSIS — E119 Type 2 diabetes mellitus without complications: Secondary | ICD-10-CM | POA: Insufficient documentation

## 2023-10-09 DIAGNOSIS — I129 Hypertensive chronic kidney disease with stage 1 through stage 4 chronic kidney disease, or unspecified chronic kidney disease: Secondary | ICD-10-CM | POA: Diagnosis not present

## 2023-10-09 DIAGNOSIS — H9190 Unspecified hearing loss, unspecified ear: Secondary | ICD-10-CM | POA: Diagnosis not present

## 2023-10-09 LAB — URINALYSIS, ROUTINE W REFLEX MICROSCOPIC

## 2023-10-09 LAB — CBC
HCT: 41.2 % (ref 39.0–52.0)
Hemoglobin: 14.1 g/dL (ref 13.0–17.0)
MCH: 31.2 pg (ref 26.0–34.0)
MCHC: 34.2 g/dL (ref 30.0–36.0)
MCV: 91.2 fL (ref 80.0–100.0)
Platelets: 200 10*3/uL (ref 150–400)
RBC: 4.52 MIL/uL (ref 4.22–5.81)
RDW: 12.9 % (ref 11.5–15.5)
WBC: 6.5 10*3/uL (ref 4.0–10.5)
nRBC: 0 % (ref 0.0–0.2)

## 2023-10-09 LAB — URINALYSIS, MICROSCOPIC (REFLEX)
Bacteria, UA: NONE SEEN
RBC / HPF: >50 RBC/hpf (ref 0–5)

## 2023-10-09 LAB — BASIC METABOLIC PANEL WITH GFR
Anion gap: 15 (ref 5–15)
BUN: 21 mg/dL (ref 8–23)
CO2: 23 mmol/L (ref 22–32)
Calcium: 10.2 mg/dL (ref 8.9–10.3)
Chloride: 100 mmol/L (ref 98–111)
Creatinine, Ser: 1.59 mg/dL — ABNORMAL HIGH (ref 0.61–1.24)
GFR, Estimated: 46 mL/min — ABNORMAL LOW (ref >60)
Glucose, Bld: 132 mg/dL — ABNORMAL HIGH (ref 70–99)
Potassium: 4.7 mmol/L (ref 3.5–5.1)
Sodium: 138 mmol/L (ref 135–145)

## 2023-10-09 MED ORDER — SODIUM CHLORIDE 0.9 % IV SOLN
1.0000 g | Freq: Once | INTRAVENOUS | Status: AC
  Administered 2023-10-09: 1 g via INTRAVENOUS
  Filled 2023-10-09: qty 10

## 2023-10-09 MED ORDER — IOHEXOL 300 MG/ML  SOLN
100.0000 mL | Freq: Once | INTRAMUSCULAR | Status: AC | PRN
  Administered 2023-10-09: 100 mL via INTRAVENOUS

## 2023-10-09 MED ORDER — CEPHALEXIN 500 MG PO CAPS
500.0000 mg | ORAL_CAPSULE | Freq: Two times a day (BID) | ORAL | 0 refills | Status: DC
Start: 2023-10-09 — End: 2023-10-12

## 2023-10-09 NOTE — ED Triage Notes (Signed)
 Pt via pov from home with hematuria since yesterday. Pt's wife has photos of toilet with blood and urine, with small clots. Pt is deaf; wife is giving most info. Pt alert & oriented, nad noted.

## 2023-10-09 NOTE — ED Notes (Signed)
 DC paperwork given and verbally understood.

## 2023-10-09 NOTE — Telephone Encounter (Signed)
 Called to reschedule but pt is peeing straight blood so we directed them to go to ED. Directed by e2c2 to wait until today but that is incorrect. Pt being taken to ED now by wife.

## 2023-10-09 NOTE — ED Provider Notes (Signed)
 [place] EMERGENCY DEPARTMENT AT [facility] Provider Note   CSN: [id number] Arrival date & time: 10/09/23  1406     Patient presents with: Hematuria   Franklin [name] is a 73 y.o. male past medical history significant for deafness, diabetes, CAD, hypertension and COPD presents today for hematuria that began yesterday.  Patient and patient's wife deny nausea, vomiting, dysuria, frequency, urgency.  Patient does note some mild right sided flank pain intermittently.    Hematuria       Prior to Admission medications   Medication Sig Start Date End Date Taking? Authorizing Provider  cephALEXin (KEFLEX) 500 MG capsule Take 1 capsule (500 mg total) by mouth 2 (two) times daily. 10/09/23  Yes Crystal Scarberry N, PA-C  aspirin EC 81 MG tablet Take 81 mg by mouth daily.    [provider]  Blood Glucose Monitoring Suppl (ONE TOUCH ULTRA 2) w/Device KIT Test sugars 3 times daily 05/27/18   Roise Cleaver, MD  clopidogrel  (PLAVIX ) 75 MG tablet Take 1 tablet (75 mg total) by mouth daily. 08/28/23   Roise Cleaver, MD  glucose blood (ONE TOUCH TEST STRIPS) test strip Test 3 times daily   E11.9 05/23/18   Roise Cleaver, MD  lisinopril  (ZESTRIL ) 10 MG tablet Take 1 tablet (10 mg total) by mouth daily. 09/01/23   Roise Cleaver, MD  metFORMIN  (GLUCOPHAGE ) 500 MG tablet Take 1 tablet (500 mg total) by mouth 2 (two) times daily with a meal. 08/28/23   Roise Cleaver, MD  nitroGLYCERIN  (NITROSTAT ) 0.4 MG SL tablet Place 1 tablet (0.4 mg total) under the tongue every 5 (five) minutes as needed for chest pain. 07/25/22   Roise Cleaver, MD  ONE TOUCH LANCETS MISC 1 each by Does not apply route 2 (two) times daily. 05/20/18   Roise Cleaver, MD  sildenafil  (REVATIO ) 20 MG tablet Take 2-5 pills at once, orally, with each sexual encounter 08/28/23   Roise Cleaver, MD    Allergies: Statins    Review of Systems  Genitourinary:  Positive for flank pain and hematuria.    Updated Vital Signs BP (!)  179/80 (BP Location: Right Arm)   Pulse 67   Temp 98.8 F (37.1 C)   Resp 16   Ht 6' 5 (1.956 m)   Wt 81.6 kg   SpO2 100%   BMI 21.34 kg/m   Physical Exam Vitals and nursing note reviewed.  Constitutional:      General: He is not in acute distress.    Appearance: Normal appearance. He is well-developed. He is not ill-appearing.  HENT:     Head: Normocephalic and atraumatic.     Right Ear: External ear normal.     Left Ear: External ear normal.     Nose: Nose normal.   Eyes:     Conjunctiva/sclera: Conjunctivae normal.    Cardiovascular:     Rate and Rhythm: Normal rate and regular rhythm.     Pulses: Normal pulses.     Heart sounds: Normal heart sounds. No murmur heard. Pulmonary:     Effort: Pulmonary effort is normal. No respiratory distress.     Breath sounds: Normal breath sounds.  Abdominal:     Palpations: Abdomen is soft.     Tenderness: There is no abdominal tenderness.   Musculoskeletal:        General: No swelling. Normal range of motion.     Cervical back: Neck supple.   Skin:    General: Skin is warm and dry.  Capillary Refill: Capillary refill takes less than 2 seconds.   Neurological:     General: No focal deficit present.     Mental Status: He is alert and oriented to person, place, and time.   Psychiatric:        Mood and Affect: Mood normal.     (all labs ordered are listed, but only abnormal results are displayed) Labs Reviewed  URINALYSIS, ROUTINE W REFLEX MICROSCOPIC - Abnormal; Notable for the following components:      Result Value   Color, Urine RED (*)    APPearance TURBID (*)    Glucose, UA   (*)    Value: TEST NOT REPORTED DUE TO COLOR INTERFERENCE OF URINE PIGMENT   Hgb urine dipstick   (*)    Value: TEST NOT REPORTED DUE TO COLOR INTERFERENCE OF URINE PIGMENT   Bilirubin Urine   (*)    Value: TEST NOT REPORTED DUE TO COLOR INTERFERENCE OF URINE PIGMENT   Ketones, ur   (*)    Value: TEST NOT REPORTED DUE TO COLOR  INTERFERENCE OF URINE PIGMENT   Protein, ur   (*)    Value: TEST NOT REPORTED DUE TO COLOR INTERFERENCE OF URINE PIGMENT   Nitrite   (*)    Value: TEST NOT REPORTED DUE TO COLOR INTERFERENCE OF URINE PIGMENT   Leukocytes,Ua   (*)    Value: TEST NOT REPORTED DUE TO COLOR INTERFERENCE OF URINE PIGMENT   All other components within normal limits  BASIC METABOLIC PANEL WITH GFR - Abnormal; Notable for the following components:   Glucose, Bld 132 (*)    Creatinine, Ser 1.59 (*)    GFR, Estimated 46 (*)    All other components within normal limits  URINE CULTURE  CBC  URINALYSIS, MICROSCOPIC (REFLEX)    EKG: None  Radiology: CT ABDOMEN PELVIS W CONTRAST Result Date: 10/09/2023 EXAM:  CT ABDOMEN PELVIS WITH IV CONTRAST INDICATION:  Abdominal/flank pain, stone suspected TECHNIQUE: Spiral CT scanning was performed through the abdomen and pelvis after the patient received IV contrast. FINDINGS: There is no significant abnormality identified in the lung bases, liver, spleen, pancreas and gallbladder. A 3 mm calculus is present at the right ureteropelvic junction. Patchy areas of decreased contrast enhancement are present in the right kidney. There is delayed excretion as compared to the left side. Right perinephric stranding is present with no abnormal collection. No calculus or obstruction is identified. A small calcification is present near the renal pelvis which is in the right renal artery. A right renal cyst is present which does not require imaging follow-up. There are no adrenal masses. The abdominal bowel loops are unremarkable. There is no evidence of ascites or adenopathy. A 3.5 x 3.2 cm infrarenal abdominal aortic aneurysm is present. There is enlargement of the prostate. The median lobe indents the bladder base. There is no inguinal hernia. Rectosigmoid stool retention is present. There is no pericecal inflammatory process. There is no fracture or bone destruction. IMPRESSION: 1.  Uncomplicated right-sided pyelonephritis. 2. No evidence of urinary tract calculus or obstruction. 3. Enlarged prostate. 4. 3.5 cm infrarenal abdominal aortic aneurysm. Follow-up imaging is recommended in 3 years. Please note that CT scanning at this site utilizes multiple dose reduction techniques, including automatic exposure control, adjustment of the MAA and/or KVP according to the patient's size, and use of iterative reconstruction. Electronically signed by: Buster Cash MD 10/09/2023 06:16 PM EDT RP Workstation: [id number]     Procedures   Medications Ordered in  the ED  cefTRIAXone (ROCEPHIN) 1 g in sodium chloride  0.9 % 100 mL IVPB (1 g Intravenous New Bag/Given 10/09/23 1840)  iohexol (OMNIPAQUE) 300 MG/ML solution 100 mL (100 mLs Intravenous Contrast Given 10/09/23 1743)                                    Medical Decision Making Amount and/or Complexity of Data Reviewed Labs: ordered. Radiology: ordered.  Risk Prescription drug management.   This patient presents to the ED for concern of hematuria differential diagnosis includes kidney stone, UTI, pyelonephritis, septic stone, bladder cancer    Additional history obtained   Additional history obtained from Electronic Medical Record External records from outside source obtained and reviewed including family medicine office visit   Lab Tests:  I Ordered, and personally interpreted labs.  The pertinent results include: UA with greater than 50 RBCs, no bacteria, 0-5 WBCs, elevated creatinine at 1.59 which is around baseline, CBC WNL   Imaging Studies ordered:  I ordered imaging studies including CT abdomen pelvis with contrast I independently visualized and interpreted imaging which showed uncomplicated right sided pyelonephritis.  3.5 cm infrarenal abdominal aortic aneurysm, follow-up recommended in 3 years. I agree with the radiologist interpretation   Medicines ordered and prescription drug management:  I ordered  medication including Rocephin    I have reviewed the patients home medicines and have made adjustments as needed   Problem List / ED Course:  Considered for admission or further workup however patient's vital signs, physical exam, labs, and imaging are reassuring.  Patient's symptoms likely due to right sided pyelonephritis.  Patient given gram of Rocephin while in ED.  Patient given outpatient course of Keflex.  Patient urine culture pending.  Patient given return precautions.  I feel patient is safe for discharge at this time.      Final diagnoses:  Acute pyelonephritis    ED Discharge Orders          Ordered    cephALEXin (KEFLEX) 500 MG capsule  2 times daily        10/09/23 1900               Carie Charity, PA-C 10/09/23 1901    Jerilynn Montenegro, MD 10/11/23 [phone]

## 2023-10-09 NOTE — Discharge Instructions (Addendum)
 Today you were seen for a urinary tract infection.  Please pick up your antibiotic and take as prescribed.  Please return to the ED if you have worsening pain, uncontrollable vomiting, or fever that does not reduce with Tylenol  or Motrin.  Thank you for letting us  treat you today. After reviewing your labs and imaging, I feel you are safe to go home. Please follow up with your PCP in the next several days and provide them with your records from this visit. Return to the Emergency Room if pain becomes severe or symptoms worsen.

## 2023-10-10 LAB — URINE CULTURE: Culture: NO GROWTH

## 2023-10-11 ENCOUNTER — Other Ambulatory Visit: Payer: Self-pay

## 2023-10-11 ENCOUNTER — Encounter (HOSPITAL_BASED_OUTPATIENT_CLINIC_OR_DEPARTMENT_OTHER): Payer: Self-pay

## 2023-10-11 DIAGNOSIS — J4489 Other specified chronic obstructive pulmonary disease: Secondary | ICD-10-CM | POA: Diagnosis present

## 2023-10-11 DIAGNOSIS — R310 Gross hematuria: Secondary | ICD-10-CM | POA: Diagnosis present

## 2023-10-11 DIAGNOSIS — G600 Hereditary motor and sensory neuropathy: Secondary | ICD-10-CM | POA: Diagnosis present

## 2023-10-11 DIAGNOSIS — I129 Hypertensive chronic kidney disease with stage 1 through stage 4 chronic kidney disease, or unspecified chronic kidney disease: Secondary | ICD-10-CM | POA: Diagnosis present

## 2023-10-11 DIAGNOSIS — Z7902 Long term (current) use of antithrombotics/antiplatelets: Secondary | ICD-10-CM

## 2023-10-11 DIAGNOSIS — Z7984 Long term (current) use of oral hypoglycemic drugs: Secondary | ICD-10-CM

## 2023-10-11 DIAGNOSIS — E785 Hyperlipidemia, unspecified: Secondary | ICD-10-CM | POA: Diagnosis present

## 2023-10-11 DIAGNOSIS — K567 Ileus, unspecified: Secondary | ICD-10-CM | POA: Diagnosis present

## 2023-10-11 DIAGNOSIS — Z79899 Other long term (current) drug therapy: Secondary | ICD-10-CM

## 2023-10-11 DIAGNOSIS — I714 Abdominal aortic aneurysm, without rupture, unspecified: Secondary | ICD-10-CM | POA: Diagnosis present

## 2023-10-11 DIAGNOSIS — F17210 Nicotine dependence, cigarettes, uncomplicated: Secondary | ICD-10-CM | POA: Diagnosis present

## 2023-10-11 DIAGNOSIS — H9190 Unspecified hearing loss, unspecified ear: Secondary | ICD-10-CM | POA: Diagnosis present

## 2023-10-11 DIAGNOSIS — N10 Acute tubulo-interstitial nephritis: Principal | ICD-10-CM | POA: Diagnosis present

## 2023-10-11 DIAGNOSIS — N401 Enlarged prostate with lower urinary tract symptoms: Secondary | ICD-10-CM | POA: Diagnosis present

## 2023-10-11 DIAGNOSIS — Z955 Presence of coronary angioplasty implant and graft: Secondary | ICD-10-CM

## 2023-10-11 DIAGNOSIS — I2510 Atherosclerotic heart disease of native coronary artery without angina pectoris: Secondary | ICD-10-CM | POA: Diagnosis present

## 2023-10-11 DIAGNOSIS — E1122 Type 2 diabetes mellitus with diabetic chronic kidney disease: Secondary | ICD-10-CM | POA: Diagnosis present

## 2023-10-11 DIAGNOSIS — N179 Acute kidney failure, unspecified: Secondary | ICD-10-CM | POA: Diagnosis present

## 2023-10-11 DIAGNOSIS — I252 Old myocardial infarction: Secondary | ICD-10-CM

## 2023-10-11 DIAGNOSIS — N1831 Chronic kidney disease, stage 3a: Secondary | ICD-10-CM | POA: Diagnosis present

## 2023-10-11 DIAGNOSIS — Z7982 Long term (current) use of aspirin: Secondary | ICD-10-CM

## 2023-10-11 DIAGNOSIS — E860 Dehydration: Secondary | ICD-10-CM | POA: Diagnosis present

## 2023-10-11 DIAGNOSIS — Z820 Family history of epilepsy and other diseases of the nervous system: Secondary | ICD-10-CM

## 2023-10-11 DIAGNOSIS — Z823 Family history of stroke: Secondary | ICD-10-CM

## 2023-10-11 DIAGNOSIS — E1142 Type 2 diabetes mellitus with diabetic polyneuropathy: Secondary | ICD-10-CM | POA: Diagnosis present

## 2023-10-11 LAB — COMPREHENSIVE METABOLIC PANEL WITH GFR
ALT: 22 U/L (ref 0–44)
AST: 26 U/L (ref 15–41)
Albumin: 4.9 g/dL (ref 3.5–5.0)
Alkaline Phosphatase: 75 U/L (ref 38–126)
Anion gap: 16 — ABNORMAL HIGH (ref 5–15)
BUN: 23 mg/dL (ref 8–23)
CO2: 25 mmol/L (ref 22–32)
Calcium: 10.3 mg/dL (ref 8.9–10.3)
Chloride: 97 mmol/L — ABNORMAL LOW (ref 98–111)
Creatinine, Ser: 1.89 mg/dL — ABNORMAL HIGH (ref 0.61–1.24)
GFR, Estimated: 37 mL/min — ABNORMAL LOW (ref >60)
Glucose, Bld: 168 mg/dL — ABNORMAL HIGH (ref 70–99)
Potassium: 4 mmol/L (ref 3.5–5.1)
Sodium: 138 mmol/L (ref 135–145)
Total Bilirubin: 0.6 mg/dL (ref 0.0–1.2)
Total Protein: 8.2 g/dL — ABNORMAL HIGH (ref 6.5–8.1)

## 2023-10-11 LAB — CBC
HCT: 40.6 % (ref 39.0–52.0)
Hemoglobin: 14.2 g/dL (ref 13.0–17.0)
MCH: 31.6 pg (ref 26.0–34.0)
MCHC: 35 g/dL (ref 30.0–36.0)
MCV: 90.2 fL (ref 80.0–100.0)
Platelets: 268 10*3/uL (ref 150–400)
RBC: 4.5 MIL/uL (ref 4.22–5.81)
RDW: 12.7 % (ref 11.5–15.5)
WBC: 11.2 10*3/uL — ABNORMAL HIGH (ref 4.0–10.5)
nRBC: 0 % (ref 0.0–0.2)

## 2023-10-11 LAB — LIPASE, BLOOD: Lipase: 20 U/L (ref 11–51)

## 2023-10-11 NOTE — ED Triage Notes (Signed)
 Pt seen x2 days ago, diagnosed with pyelonephritis (R-sided), sent home w/ antibiotic. Reports ongoing hematuria, compliant w/ abx. C/o 'bowel blockage', last BM 6/17. C/o intense RLQ pain, nontender. Hx appendectomy. OTC enema and laxatives w/ no relief. RLQ firm, reports cannot pas gas.   Pt is deaf

## 2023-10-12 ENCOUNTER — Inpatient Hospital Stay (HOSPITAL_BASED_OUTPATIENT_CLINIC_OR_DEPARTMENT_OTHER)
Admission: EM | Admit: 2023-10-12 | Discharge: 2023-10-15 | DRG: 690 | Disposition: A | Discharge status: Home / Self Care | Attending: Family Medicine | Admitting: Family Medicine

## 2023-10-12 DIAGNOSIS — K5909 Other constipation: Secondary | ICD-10-CM | POA: Diagnosis not present

## 2023-10-12 DIAGNOSIS — N12 Tubulo-interstitial nephritis, not specified as acute or chronic: Principal | ICD-10-CM

## 2023-10-12 DIAGNOSIS — I10 Essential (primary) hypertension: Secondary | ICD-10-CM | POA: Diagnosis not present

## 2023-10-12 DIAGNOSIS — Z823 Family history of stroke: Secondary | ICD-10-CM | POA: Diagnosis not present

## 2023-10-12 DIAGNOSIS — E1142 Type 2 diabetes mellitus with diabetic polyneuropathy: Secondary | ICD-10-CM | POA: Diagnosis present

## 2023-10-12 DIAGNOSIS — F17210 Nicotine dependence, cigarettes, uncomplicated: Secondary | ICD-10-CM | POA: Diagnosis present

## 2023-10-12 DIAGNOSIS — K567 Ileus, unspecified: Secondary | ICD-10-CM | POA: Insufficient documentation

## 2023-10-12 DIAGNOSIS — Z7901 Long term (current) use of anticoagulants: Secondary | ICD-10-CM | POA: Diagnosis not present

## 2023-10-12 DIAGNOSIS — Z7902 Long term (current) use of antithrombotics/antiplatelets: Secondary | ICD-10-CM | POA: Diagnosis not present

## 2023-10-12 DIAGNOSIS — R140 Abdominal distension (gaseous): Secondary | ICD-10-CM | POA: Diagnosis not present

## 2023-10-12 DIAGNOSIS — I714 Abdominal aortic aneurysm, without rupture, unspecified: Secondary | ICD-10-CM | POA: Diagnosis present

## 2023-10-12 DIAGNOSIS — E860 Dehydration: Secondary | ICD-10-CM | POA: Diagnosis present

## 2023-10-12 DIAGNOSIS — I2510 Atherosclerotic heart disease of native coronary artery without angina pectoris: Secondary | ICD-10-CM | POA: Diagnosis present

## 2023-10-12 DIAGNOSIS — K5900 Constipation, unspecified: Secondary | ICD-10-CM | POA: Diagnosis not present

## 2023-10-12 DIAGNOSIS — J449 Chronic obstructive pulmonary disease, unspecified: Secondary | ICD-10-CM | POA: Diagnosis not present

## 2023-10-12 DIAGNOSIS — R310 Gross hematuria: Secondary | ICD-10-CM | POA: Diagnosis not present

## 2023-10-12 DIAGNOSIS — N179 Acute kidney failure, unspecified: Secondary | ICD-10-CM | POA: Diagnosis not present

## 2023-10-12 DIAGNOSIS — N401 Enlarged prostate with lower urinary tract symptoms: Secondary | ICD-10-CM | POA: Diagnosis present

## 2023-10-12 DIAGNOSIS — N1831 Chronic kidney disease, stage 3a: Secondary | ICD-10-CM | POA: Diagnosis present

## 2023-10-12 DIAGNOSIS — N10 Acute tubulo-interstitial nephritis: Secondary | ICD-10-CM | POA: Diagnosis present

## 2023-10-12 DIAGNOSIS — E1122 Type 2 diabetes mellitus with diabetic chronic kidney disease: Secondary | ICD-10-CM | POA: Diagnosis present

## 2023-10-12 DIAGNOSIS — K56609 Unspecified intestinal obstruction, unspecified as to partial versus complete obstruction: Secondary | ICD-10-CM | POA: Diagnosis not present

## 2023-10-12 DIAGNOSIS — I129 Hypertensive chronic kidney disease with stage 1 through stage 4 chronic kidney disease, or unspecified chronic kidney disease: Secondary | ICD-10-CM | POA: Diagnosis present

## 2023-10-12 DIAGNOSIS — E785 Hyperlipidemia, unspecified: Secondary | ICD-10-CM | POA: Diagnosis present

## 2023-10-12 DIAGNOSIS — I252 Old myocardial infarction: Secondary | ICD-10-CM | POA: Diagnosis not present

## 2023-10-12 DIAGNOSIS — Z955 Presence of coronary angioplasty implant and graft: Secondary | ICD-10-CM | POA: Diagnosis not present

## 2023-10-12 DIAGNOSIS — I700 Atherosclerosis of aorta: Secondary | ICD-10-CM | POA: Diagnosis not present

## 2023-10-12 DIAGNOSIS — Z7982 Long term (current) use of aspirin: Secondary | ICD-10-CM | POA: Diagnosis not present

## 2023-10-12 DIAGNOSIS — H9190 Unspecified hearing loss, unspecified ear: Secondary | ICD-10-CM | POA: Diagnosis present

## 2023-10-12 DIAGNOSIS — J4489 Other specified chronic obstructive pulmonary disease: Secondary | ICD-10-CM | POA: Diagnosis present

## 2023-10-12 DIAGNOSIS — Z7984 Long term (current) use of oral hypoglycemic drugs: Secondary | ICD-10-CM | POA: Diagnosis not present

## 2023-10-12 DIAGNOSIS — Z820 Family history of epilepsy and other diseases of the nervous system: Secondary | ICD-10-CM | POA: Diagnosis not present

## 2023-10-12 DIAGNOSIS — Z79899 Other long term (current) drug therapy: Secondary | ICD-10-CM | POA: Diagnosis not present

## 2023-10-12 LAB — URINALYSIS, ROUTINE W REFLEX MICROSCOPIC
Bacteria, UA: NONE SEEN
Bilirubin Urine: NEGATIVE
Glucose, UA: 100 mg/dL — AB
Ketones, ur: NEGATIVE mg/dL
Leukocytes,Ua: NEGATIVE
Nitrite: NEGATIVE
Protein, ur: 100 mg/dL — AB
RBC / HPF: >50 RBC/hpf (ref 0–5)
Specific Gravity, Urine: 1.015 (ref 1.005–1.030)
pH: 7.5 (ref 5.0–8.0)

## 2023-10-12 LAB — BASIC METABOLIC PANEL WITH GFR
Anion gap: 15 (ref 5–15)
BUN: 23 mg/dL (ref 8–23)
CO2: 22 mmol/L (ref 22–32)
Calcium: 9.4 mg/dL (ref 8.9–10.3)
Chloride: 98 mmol/L (ref 98–111)
Creatinine, Ser: 1.91 mg/dL — ABNORMAL HIGH (ref 0.61–1.24)
GFR, Estimated: 37 mL/min — ABNORMAL LOW (ref >60)
Glucose, Bld: 187 mg/dL — ABNORMAL HIGH (ref 70–99)
Potassium: 4 mmol/L (ref 3.5–5.1)
Sodium: 135 mmol/L (ref 135–145)

## 2023-10-12 LAB — CBC
HCT: 41.4 % (ref 39.0–52.0)
Hemoglobin: 13.8 g/dL (ref 13.0–17.0)
MCH: 31.2 pg (ref 26.0–34.0)
MCHC: 33.3 g/dL (ref 30.0–36.0)
MCV: 93.5 fL (ref 80.0–100.0)
Platelets: 238 10*3/uL (ref 150–400)
RBC: 4.43 MIL/uL (ref 4.22–5.81)
RDW: 12.8 % (ref 11.5–15.5)
WBC: 11.9 10*3/uL — ABNORMAL HIGH (ref 4.0–10.5)
nRBC: 0 % (ref 0.0–0.2)

## 2023-10-12 LAB — GLUCOSE, CAPILLARY: Glucose-Capillary: 198 mg/dL — ABNORMAL HIGH (ref 70–99)

## 2023-10-12 MED ORDER — ONDANSETRON HCL 4 MG PO TABS: 4.0000 mg | ORAL_TABLET | Freq: Four times a day (QID) | ORAL | Status: DC | PRN

## 2023-10-12 MED ORDER — HYDROMORPHONE HCL 1 MG/ML IJ SOLN
0.5000 mg | INTRAMUSCULAR | Status: AC | PRN
  Administered 2023-10-12 – 2023-10-13 (×3): 0.5 mg via INTRAVENOUS
  Filled 2023-10-12 (×3): qty 0.5

## 2023-10-12 MED ORDER — SODIUM CHLORIDE 0.9 % IV SOLN
2.0000 g | Freq: Once | INTRAVENOUS | Status: AC
  Administered 2023-10-12: 2 g via INTRAVENOUS
  Filled 2023-10-12: qty 20

## 2023-10-12 MED ORDER — ASPIRIN 81 MG PO TBEC
81.0000 mg | DELAYED_RELEASE_TABLET | Freq: Every day | ORAL | Status: DC
  Administered 2023-10-12 – 2023-10-13 (×2): 81 mg via ORAL
  Filled 2023-10-12 (×3): qty 1

## 2023-10-12 MED ORDER — ENOXAPARIN SODIUM 40 MG/0.4ML IJ SOSY
40.0000 mg | PREFILLED_SYRINGE | Freq: Every day | INTRAMUSCULAR | Status: DC
  Filled 2023-10-12 (×2): qty 0.4

## 2023-10-12 MED ORDER — METFORMIN HCL 500 MG PO TABS
500.0000 mg | ORAL_TABLET | Freq: Two times a day (BID) | ORAL | Status: DC
  Administered 2023-10-12 – 2023-10-13 (×2): 500 mg via ORAL
  Filled 2023-10-12 (×2): qty 1

## 2023-10-12 MED ORDER — ACETAMINOPHEN 650 MG RE SUPP: 650.0000 mg | Freq: Four times a day (QID) | RECTAL | Status: DC | PRN

## 2023-10-12 MED ORDER — LACTATED RINGERS IV SOLN: INTRAVENOUS | Status: AC

## 2023-10-12 MED ORDER — IPRATROPIUM-ALBUTEROL 0.5-2.5 (3) MG/3ML IN SOLN: 3.0000 mL | Freq: Four times a day (QID) | RESPIRATORY_TRACT | Status: DC | PRN

## 2023-10-12 MED ORDER — SENNOSIDES-DOCUSATE SODIUM 8.6-50 MG PO TABS: 1.0000 | ORAL_TABLET | Freq: Every evening | ORAL | Status: DC | PRN

## 2023-10-12 MED ORDER — ACETAMINOPHEN 325 MG PO TABS
650.0000 mg | ORAL_TABLET | Freq: Four times a day (QID) | ORAL | Status: DC | PRN
  Administered 2023-10-12 – 2023-10-13 (×3): 650 mg via ORAL
  Filled 2023-10-12 (×3): qty 2

## 2023-10-12 MED ORDER — POLYETHYLENE GLYCOL 3350 17 G PO PACK
17.0000 g | PACK | Freq: Two times a day (BID) | ORAL | Status: DC
  Administered 2023-10-12 – 2023-10-15 (×6): 17 g via ORAL
  Filled 2023-10-12 (×7): qty 1

## 2023-10-12 MED ORDER — HYDROMORPHONE HCL 1 MG/ML IJ SOLN
0.5000 mg | INTRAMUSCULAR | Status: AC | PRN
  Administered 2023-10-12 (×3): 0.5 mg via INTRAVENOUS
  Filled 2023-10-12 (×2): qty 1
  Filled 2023-10-12: qty 0.5

## 2023-10-12 MED ORDER — ONDANSETRON HCL 4 MG/2ML IJ SOLN: 4.0000 mg | Freq: Four times a day (QID) | INTRAMUSCULAR | Status: DC | PRN

## 2023-10-12 MED ORDER — SENNOSIDES-DOCUSATE SODIUM 8.6-50 MG PO TABS
2.0000 | ORAL_TABLET | Freq: Two times a day (BID) | ORAL | Status: DC
  Administered 2023-10-12 – 2023-10-15 (×6): 2 via ORAL
  Filled 2023-10-12 (×6): qty 2

## 2023-10-12 MED ORDER — LACTATED RINGERS IV BOLUS
1000.0000 mL | Freq: Once | INTRAVENOUS | Status: AC
  Administered 2023-10-12: 1000 mL via INTRAVENOUS

## 2023-10-12 MED ORDER — HYDRALAZINE HCL 20 MG/ML IJ SOLN: 10.0000 mg | Freq: Four times a day (QID) | INTRAMUSCULAR | Status: DC | PRN

## 2023-10-12 MED ORDER — SODIUM CHLORIDE 0.9 % IV SOLN
2.0000 g | Freq: Every day | INTRAVENOUS | Status: DC
  Administered 2023-10-13 – 2023-10-15 (×3): 2 g via INTRAVENOUS
  Filled 2023-10-12 (×4): qty 20

## 2023-10-12 MED ORDER — CLOPIDOGREL BISULFATE 75 MG PO TABS
75.0000 mg | ORAL_TABLET | Freq: Every day | ORAL | Status: DC
  Administered 2023-10-12 – 2023-10-15 (×4): 75 mg via ORAL
  Filled 2023-10-12 (×4): qty 1

## 2023-10-12 MED ORDER — INSULIN ASPART 100 UNIT/ML IJ SOLN
0.0000 [IU] | Freq: Three times a day (TID) | INTRAMUSCULAR | Status: DC
  Administered 2023-10-13: 2 [IU] via SUBCUTANEOUS
  Administered 2023-10-13: 3 [IU] via SUBCUTANEOUS
  Administered 2023-10-14: 2 [IU] via SUBCUTANEOUS
  Administered 2023-10-14: 3 [IU] via SUBCUTANEOUS
  Administered 2023-10-14: 2 [IU] via SUBCUTANEOUS
  Administered 2023-10-15: 3 [IU] via SUBCUTANEOUS

## 2023-10-12 NOTE — TOC Initial Note (Signed)
 Transition of Care [facility]) - Initial/Assessment Note    Patient Details  Name: Franklin [name] MRN: [id number] Date of Birth: 01/11/1951  [facility]) CM/SW Contact:    Sonda Manuella Quill, RN Phone Number: 10/12/2023, 4:01 PM  Clinical Narrative:                 Pt is deaf, no family at bedside; called his POC [name] Allouez (spouse [phone]); she says pt lives at home; she plans for him to return at d/c; she says their dtr will provide transportation; she verified insurance/PCP; she also denied pt experiencing SDOH risks; pt does not have DME, HH services, or home oxygen; TOC will follow.  Expected Discharge Plan: Home/Self Care Barriers to Discharge: Continued Medical Work up   Patient Goals and CMS Choice Patient states their goals for this hospitalization and ongoing recovery are:: patient's wife [name] [place] says he will return home CMS Medicare.gov Compare Post Acute Care list provided to:: Patient Represenative (must comment) Charlann [place] (wife)) Choice offered to / list presented to : Spouse [place] ownership interest in [facility].provided to:: Spouse    Expected Discharge Plan and Services   Discharge Planning Services: CM Consult                                          Prior Living Arrangements/Services   Lives with:: Spouse Patient language and need for interpreter reviewed:: Yes Do you feel safe going back to the place where you live?: Yes      Need for Family Participation in Patient Care: Yes (Comment) Care giver support system in place?: Yes (comment) Current home services:  (n/a) Criminal Activity/Legal Involvement Pertinent to Current Situation/Hospitalization: No - Comment as needed  Activities of Daily Living   ADL Screening (condition at time of admission) Independently performs ADLs?: Yes (appropriate for developmental age) Is the patient deaf or have difficulty hearing?: Yes Does the patient  have difficulty seeing, even when wearing glasses/contacts?: No Does the patient have difficulty concentrating, remembering, or making decisions?: No  Permission Sought/Granted Permission sought to share information with : Case Manager Permission granted to share information with : Yes, Verbal Permission Granted  Share Information with NAME: Case Manager     Permission granted to share info w Relationship: [name] [place] (spouse) [phone]     Emotional Assessment Appearance:: Appears stated age Attitude/Demeanor/Rapport: Unable to Assess Affect (typically observed): Unable to Assess Orientation: :  (unable to assess) Alcohol / Substance Use: Not Applicable Psych Involvement: No (comment)  Admission diagnosis:  Acute pyelonephritis [N10] Patient Active Problem List   Diagnosis Date Noted   Acute pyelonephritis 10/12/2023   Constipation 10/12/2023   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) 10/12/2023   Hypertension 10/12/2023   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   Essential hypertension 04/25/2020   Dyslipidemia 02/24/2019   Tobacco abuse 02/24/2019   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 [place] [address] Phone: [phone] Fax: [phone]  [facility]) - [place], [place] - [address] [facility] [address] [place] [address] [place] [address] Phone: [phone] Fax: [phone]  [facility]  [phone] [place], [place] - [address] [place] [address] Phone: [phone] Fax: [phone]  [facility] - [place], [place] - [address] [place] [address] Phone: [phone] Fax: [phone]  [facility] - [place], [place] - [address] [place] [address] Phone: [phone] Fax: [phone]  [facility] [address] [place] [address] Phone: [phone] Fax: [phone]     Social Drivers of Health (SDOH) Social History: SDOH Screenings   Food Insecurity: No Food Insecurity (10/12/2023)  Housing: Low Risk  (10/12/2023)  Transportation Needs: No Transportation Needs (10/12/2023)  Utilities: Not At Risk (10/12/2023)  Alcohol Screen: Low Risk  (12/12/2022)  Depression (PHQ2-9): Low Risk  (08/28/2023)  Financial Resource Strain: Low Risk  (08/26/2023)  Physical Activity: Insufficiently Active (08/26/2023)  Social Connections: Moderately Integrated (10/12/2023)  Stress: No Stress Concern Present (08/26/2023)  Tobacco Use: High Risk (10/11/2023)  Health Literacy: Adequate Health Literacy (12/12/2022)   SDOH Interventions: Food Insecurity Interventions: Intervention Not Indicated, Inpatient TOC Housing Interventions: Intervention Not Indicated, Inpatient TOC Transportation Interventions: Intervention Not Indicated, Inpatient TOC Utilities Interventions: Intervention Not Indicated, Inpatient TOC   Readmission Risk Interventions     No data to display

## 2023-10-12 NOTE — ED Notes (Signed)
 Report called to [facility] and given to [facility]

## 2023-10-12 NOTE — Progress Notes (Signed)
 Patient present with right flank pain, recent ED visit 10/09/2023, diagnosed with acute pyelonephritis on CT scan 6/18, discharged home on Keflex, despite that he lacks improvement, still having rigors and fever at home, currently with worsening leukocytosis, UA still strongly positive, so he will be admitted for IV antibiotics as he failed oral antibiotics as an outpatient, has worsening renal function, will check bladder scan x 1, ED to send blood cultures, urine cultures negative from 2 days ago, accepted to MedSurg floor, as well he is having constipation so ordered some laxatives in ED as well. Brayton Lye MD

## 2023-10-12 NOTE — H&P (Signed)
 History and Physical  Franklin [name] FMW:[id number] DOB: 1950/06/24 DOA: 10/12/2023  PCP: Zollie Lowers, MD   Chief Complaint: Abdominal pain, constipation  HPI: Franklin [name] is a 73 y.o. male with medical history significant for type 2 diabetes, CAD s/p PCI, peripheral neuropathy, COPD, Charcot-Marie-Tooth disease, deafness and BPH who returns today ED for evaluation of worsening abdominal pain.  Per spouse, patient had hematuria and abdominal pain on Wednesday.  Presented to the ED for evaluation.  Patient diagnosed with pyelonephritis and discharged home on Keflex.  Per spouse, patient continuing to have abdominal pain and constipation.  His last bowel movement was last Tuesday and he has unable to move his bowels even after receiving enema at home.  On evaluation, patient comfortably in bed. Evaluation done with writings on a sheet of paper. Patient endorse lower abdominal pain but states it is improving. He denies any fever, chills, nausea, vomiting or dysuria.  [facility] ED Course: Initial vitals show temp 98, RR 20, HR 105, BP 161/97, SpO2 96% on room air. Initial labs significant for glucose 168, creatinine 1.89, WBC 11.2, UA shows glucosuria hemoglobinuria negative nitrite, negative leuks, WBC 6-10, RBC >50 and no bacteria.  CT A/P on 6/18 shows uncomplicated right-sided pyelonephritis and enlarged prostate but no urinary tract obstruction. Pt received IV Rocephin , IV LR 1 L bolus x 1 and started on IV LR 75 cc/h infusion. TRH was consulted for admission.   Review of Systems: Please see HPI for pertinent positives and negatives. A complete 10 system review of systems are otherwise negative.  Past Medical History:  Diagnosis Date   Charcot-Marie-Tooth disease    COPD (chronic obstructive pulmonary disease) (HCC)    has smokers cough   Coronary artery disease 2009   MI-stents x2   Coronary atherosclerosis of native coronary artery    History of inferior myocardial infarction,  treated with   CYPHER stenting of circumflex and right coronary artery, February 2004.Status post non-ST segment myocardial infarction treated with PROMUS drug-eluting stenting of distal right coronary artery and posterior descending (coronary) artery, August 2009.  EF 55-60%. Non-ischemic exercise Myoview; EF 50%, October 2009.     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 His smoking use included cigarettes. He started smoking about 57 years ago. He has a 57.8 pack-year smoking history. He uses smokeless tobacco. He reports that he does not drink alcohol and does not use drugs.  Allergies  Allergen Reactions   Statins Other (See Comments)    Myalgia    Family History  Problem Relation Age of Onset   Alzheimer's disease Mother    Charcot-Marie-Tooth disease Sister    Charcot-Marie-Tooth disease Daughter    Hearing loss Son    Stroke Son      Prior to Admission medications   Medication Sig Start Date End Date Taking? Authorizing Provider  aspirin  EC 81 MG tablet Take 81 mg by mouth daily.    [provider]  Blood Glucose Monitoring Suppl (ONE TOUCH ULTRA 2) w/Device KIT Test sugars 3 times daily 05/27/18   Zollie Lowers, MD  cephALEXin (KEFLEX) 500 MG capsule Take 1 capsule (500 mg total) by mouth 2 (two) times daily. 10/09/23   Keith, Kayla N, PA-C  clopidogrel  (PLAVIX ) 75 MG tablet Take 1 tablet (75 mg total) by mouth daily. 08/28/23   Zollie Lowers, MD  glucose blood (ONE TOUCH TEST STRIPS) test strip Test 3 times  daily   E11.9 05/23/18   Zollie Lowers, MD  lisinopril  (ZESTRIL ) 10 MG tablet Take 1 tablet (10 mg total) by mouth daily. 09/01/23   Zollie Lowers, MD  metFORMIN  (GLUCOPHAGE ) 500 MG tablet Take 1 tablet (500 mg total) by mouth 2 (two) times daily with a meal. 08/28/23   Zollie Lowers, MD  nitroGLYCERIN  (NITROSTAT ) 0.4 MG SL tablet Place 1 tablet (0.4 mg total) under the tongue every 5 (five) minutes as needed for chest pain. 07/25/22   Zollie Lowers, MD  ONE TOUCH LANCETS MISC 1 each by Does not apply route 2 (two) times daily. 05/20/18   Zollie Lowers, MD  sildenafil  (REVATIO ) 20 MG tablet Take 2-5 pills at once, orally, with each sexual encounter 08/28/23   Zollie Lowers, MD    Physical Exam: BP (!) 166/83 (BP Location: Right Arm)   Pulse 94   Temp 99 F (37.2 C) (Oral)   Resp 18   Ht 6' 5 (1.956 m)   Wt 81.3 kg   SpO2 94%   BMI 21.25 kg/m  General: Pleasant, well-appearing elderly man laying in bed. No acute distress. HEENT: Deaf. [place]/AT. Anicteric sclera.  CV: RRR. No murmurs, rubs, or gallops. No LE edema Pulmonary: Lungs CTAB. Normal effort. No wheezing or rales. Abdominal: Soft, nontender, nondistended. Normal bowel sounds. Extremities: Palpable radial and DP pulses. Normal ROM. Skin: Warm and dry. No obvious rash or lesions. Decreased skin turgor Neuro: A&Ox3. Moves all extremities. Normal sensation to light touch. No focal deficit. Psych: Normal mood and affect          Labs on Admission:  Basic Metabolic Panel: Recent Labs  Lab 10/09/23 1445 10/11/23 2232  NA 138 138  K 4.7 4.0  CL 100 97*  CO2 23 25  GLUCOSE 132* 168*  BUN 21 23  CREATININE 1.59* 1.89*  CALCIUM 10.2 10.3   Liver Function Tests: Recent Labs  Lab 10/11/23 2232  AST 26  ALT 22  ALKPHOS 75  BILITOT 0.6  PROT 8.2*  ALBUMIN 4.9   Recent Labs  Lab 10/11/23 2232  LIPASE 20   No results for input(s): AMMONIA in the last 168 hours. CBC: Recent Labs  Lab 10/09/23 1445 10/11/23 2232   WBC 6.5 11.2*  HGB 14.1 14.2  HCT 41.2 40.6  MCV 91.2 90.2  PLT 200 268   Cardiac Enzymes: No results for input(s): CKTOTAL, CKMB, CKMBINDEX, TROPONINI in the last 168 hours. BNP (last 3 results) No results for input(s): BNP in the last 8760 hours.  ProBNP (last 3 results) No results for input(s): PROBNP in the last 8760 hours.  CBG: No results for input(s): GLUCAP in the last 168 hours.  Radiological Exams on Admission: No results found. Assessment/Plan Franklin [name] is a 73 y.o. male with medical history significant for type 2 diabetes, CAD s/p PCI, peripheral neuropathy, COPD, CKD 3A, Charcot-Marie-Tooth disease, deafness and BPH who returns today ED for evaluation of worsening abdominal pain and admitted for acute pyelonephritis and constipation.  # Pyelonephritis - Recently  diagnosed with right-sided pyelonephritis so presented with hematuria and right flank pain - Repeat UA on admission shows improving urinary infection - Hemodynamically stable, with no significant abdominal tenderness on exam - Continue IV Rocephin  - Urine culture on 6/18 with no growth, follow-up repeat urine culture and blood culture - Trend CBC, fever curve  # Constipation - Per spouse patient has not had a bowel movement for the last 5 days - Tried enema at home without relief - Abdominal pain likely secondary to his constipation - Continue MiraLAX  twice daily, add Senokot-S as twice daily - Plan to give mag citrate if no bowel movements by this evening  # AKI on CKD 3A - Creatinine elevated to 1.91, from baseline of around 1.3-1.5 - Likely secondary to mild dehydration - Increase IV LR to 100 cc/hr for 1 day - Trend renal function - Avoid nephrotoxic meds  # Hypertension - Per spouse, patient recently diagnosed with hypertension and started on lisinopril  10 mg daily - However, patient does not believe he has hypertension so has not taken the lisinopril  - BP elevated with  SBP in the 130s to 160s - Address constipation and monitor for now - IV hydralazine PRN for SBP > 180  # CAD s/PCIp  - Continue aspirin  and Plavix   # COPD - Chronic and stable -As needed DuoNebs  # T2DM - Last A1c 6.6% 1 month ago - Continue metformin  -SSI with meals, CBG monitoring  # Charcot-Marie-Tooth disease # Peripheral neuropathy # Deafness - At baseline, communicates with his iPad per spouse   DVT prophylaxis: Lovenox      Code Status: Full Code  Consults called: None  Family Communication: Discussed admission with spouse over the phone  Severity of Illness: The appropriate patient status for this patient is INPATIENT. Inpatient status is judged to be reasonable and necessary in order to provide the required intensity of service to ensure the patient's safety. The patient's presenting symptoms, physical exam findings, and initial radiographic and laboratory data in the context of their chronic comorbidities is felt to place them at high risk for further clinical deterioration. Furthermore, it is not anticipated that the patient will be medically stable for discharge from the hospital within 2 midnights of admission.   * I certify that at the point of admission it is my clinical judgment that the patient will require inpatient hospital care spanning beyond 2 midnights from the point of admission due to high intensity of service, high risk for further deterioration and high frequency of surveillance required.*  Level of care: Med-Surg   This record has been created using Conservation officer, historic buildings. Errors have been sought and corrected, but may not always be located. Such creation errors do not reflect on the standard of care.   Lou Claretta HERO, MD 10/12/2023, 7:45 AM [facility] Pager: [phone] Isaiah 41:10   If 7PM-7AM, please contact night-coverage [email or web address] Password [email or web address]

## 2023-10-12 NOTE — Plan of Care (Signed)

## 2023-10-12 NOTE — ED Provider Notes (Signed)
 [place] EMERGENCY DEPARTMENT AT [facility] Provider Note   CSN: [id number] Arrival date & time: 10/11/23  2217     History Chief Complaint  Patient presents with   Abdominal Pain    HPI Franklin [name] is a 73 y.o. male presenting for chief complaint of abdominal pain. He was seen 2 days ago and diagnosed with worsening abdominal pain States that he is having inability with bowel movements and severe abdominal pain.  Diagnosed with pyelonephritis.  Started having rigors tonight.    Patient's recorded medical, surgical, social, medication list and allergies were reviewed in the Snapshot window as part of the initial history.   Review of Systems   Review of Systems  Constitutional:  Negative for chills and fever.  HENT:  Negative for ear pain and sore throat.   Eyes:  Negative for pain and visual disturbance.  Respiratory:  Negative for cough and shortness of breath.   Cardiovascular:  Negative for chest pain and palpitations.  Gastrointestinal:  Positive for abdominal pain, nausea and vomiting.  Genitourinary:  Negative for dysuria and hematuria.  Musculoskeletal:  Negative for arthralgias and back pain.  Skin:  Negative for color change and rash.  Neurological:  Negative for seizures and syncope.  All other systems reviewed and are negative.   Physical Exam Updated Vital Signs BP (!) 166/83 (BP Location: Right Arm)   Pulse 94   Temp 99 F (37.2 C) (Oral)   Resp 18   SpO2 94%  Physical Exam Vitals and nursing note reviewed.  Constitutional:      General: He is not in acute distress.    Appearance: He is well-developed.  HENT:     Head: Normocephalic and atraumatic.   Eyes:     Conjunctiva/sclera: Conjunctivae normal.    Cardiovascular:     Rate and Rhythm: Normal rate and regular rhythm.     Heart sounds: No murmur heard. Pulmonary:     Effort: Pulmonary effort is normal. No respiratory distress.     Breath sounds: Normal breath sounds.   Abdominal:     Palpations: Abdomen is soft.     Tenderness: There is no abdominal tenderness.   Musculoskeletal:        General: No swelling.     Cervical back: Neck supple.   Skin:    General: Skin is warm and dry.     Capillary Refill: Capillary refill takes less than 2 seconds.   Neurological:     Mental Status: He is alert.   Psychiatric:        Mood and Affect: Mood normal.      ED Course/ Medical Decision Making/ A&P    Procedures Procedures   Medications Ordered in ED Medications  HYDROmorphone  (DILAUDID ) injection 0.5 mg (0.5 mg Intravenous Given 10/12/23 0258)  polyethylene glycol (MIRALAX  / GLYCOLAX ) packet 17 g (17 g Oral Given 10/12/23 0059)  lactated ringers  bolus 1,000 mL (0 mLs Intravenous Stopped 10/12/23 0145)  cefTRIAXone  (ROCEPHIN ) 2 g in sodium chloride  0.9 % 100 mL IVPB (0 g Intravenous Stopped 10/12/23 0130)    Medical Decision Making:   Franklin [name] is a 73 y.o. male who presented to the ED today with ongoing flank pain detailed above.    Additional history discussed with patient's family/caregivers.  Patient placed on continuous vitals and telemetry monitoring while in ED which was reviewed periodically.  Complete initial physical exam performed, notably the patient  was hemodynamically stable no acute distress.  Very tender on  his right flank.    Reviewed and confirmed nursing documentation for past medical history, family history, social history.    Initial Assessment:   With the patient's presentation of ongoing right flank pain, most likely diagnosis is pyelonephritis failing outpatient therapy. Other diagnoses were considered including (but not limited to) appendicitis, cholecystitis, small bowel obstruction. These are considered less likely due to history of present illness and physical exam findings.   This is most consistent with an acute life/limb threatening illness complicated by underlying chronic conditions.  Initial Plan:   Consider repeating a CT scan, however given worsening AKI, repeat contrast load is considered likely more detrimental than benefit Screening labs including CBC and Metabolic panel to evaluate for infectious or metabolic etiology of disease.  Urinalysis with reflex culture ordered to evaluate for UTI or relevant urologic/nephrologic pathology.  CXR to evaluate for structural/infectious intrathoracic pathology.  Objective evaluation as below reviewed   Initial Study Results:   Laboratory  All laboratory results reviewed without evidence of clinically relevant pathology.   Exceptions include: Rising white count, rising creatinine   Radiology:  All images reviewed independently. Agree with radiology report at this time.   CT ABDOMEN PELVIS W CONTRAST Result Date: 10/09/2023 EXAM:  CT ABDOMEN PELVIS WITH IV CONTRAST INDICATION:  Abdominal/flank pain, stone suspected TECHNIQUE: Spiral CT scanning was performed through the abdomen and pelvis after the patient received IV contrast. FINDINGS: There is no significant abnormality identified in the lung bases, liver, spleen, pancreas and gallbladder. A 3 mm calculus is present at the right ureteropelvic junction. Patchy areas of decreased contrast enhancement are present in the right kidney. There is delayed excretion as compared to the left side. Right perinephric stranding is present with no abnormal collection. No calculus or obstruction is identified. A small calcification is present near the renal pelvis which is in the right renal artery. A right renal cyst is present which does not require imaging follow-up. There are no adrenal masses. The abdominal bowel loops are unremarkable. There is no evidence of ascites or adenopathy. A 3.5 x 3.2 cm infrarenal abdominal aortic aneurysm is present. There is enlargement of the prostate. The median lobe indents the bladder base. There is no inguinal hernia. Rectosigmoid stool retention is present. There is no  pericecal inflammatory process. There is no fracture or bone destruction. IMPRESSION: 1. Uncomplicated right-sided pyelonephritis. 2. No evidence of urinary tract calculus or obstruction. 3. Enlarged prostate. 4. 3.5 cm infrarenal abdominal aortic aneurysm. Follow-up imaging is recommended in 3 years. Please note that CT scanning at this site utilizes multiple dose reduction techniques, including automatic exposure control, adjustment of the MAA and/or KVP according to the patient's size, and use of iterative reconstruction. Electronically signed by: Eddy Oar MD 10/09/2023 06:16 PM EDT RP Workstation: [id number]      Consults: Case discussed with hospitalist.   Reassessment and Plan:   Ultimately his presentation is most consistent with failure of outpatient therapy for pyelonephritis given worsening AKI, [place] ptosis.  Will restart on IV antibiotics, IV fluid, admit to medicine for serial lab work, ongoing care and management.  He has a urine culture from the outpatient setting that is currently in process, inpatient team requested blood cultures as well (initially held off as he had gotten IV Rocephin  48 hours prior to this)   Disposition:   Based on the above findings, I believe this patient is stable for admission.    Patient/family educated about specific findings on our evaluation and explained exact reasons  for admission.  Patient/family educated about clinical situation and time was allowed to answer questions.   Admission team communicated with and agreed with need for admission. Patient admitted. Patient ready to move at this time.     Emergency Department Medication Summary:   Medications  HYDROmorphone  (DILAUDID ) injection 0.5 mg (0.5 mg Intravenous Given 10/12/23 0258)  polyethylene glycol (MIRALAX  / GLYCOLAX ) packet 17 g (17 g Oral Given 10/12/23 0059)  lactated ringers  bolus 1,000 mL (0 mLs Intravenous Stopped 10/12/23 0145)  cefTRIAXone  (ROCEPHIN ) 2 g in sodium chloride  0.9 %  100 mL IVPB (0 g Intravenous Stopped 10/12/23 0130)         Clinical Impression:  1. Pyelonephritis      Admit   Final Clinical Impression(s) / ED Diagnoses Final diagnoses:  Pyelonephritis    Rx / DC Orders ED Discharge Orders     None         Jerral Meth, MD 10/12/23 [phone]

## 2023-10-13 ENCOUNTER — Inpatient Hospital Stay (HOSPITAL_COMMUNITY)

## 2023-10-13 DIAGNOSIS — J449 Chronic obstructive pulmonary disease, unspecified: Secondary | ICD-10-CM

## 2023-10-13 DIAGNOSIS — N10 Acute tubulo-interstitial nephritis: Secondary | ICD-10-CM | POA: Diagnosis not present

## 2023-10-13 DIAGNOSIS — K5900 Constipation, unspecified: Secondary | ICD-10-CM | POA: Diagnosis not present

## 2023-10-13 LAB — CBC
HCT: 36.2 % — ABNORMAL LOW (ref 39.0–52.0)
Hemoglobin: 12.1 g/dL — ABNORMAL LOW (ref 13.0–17.0)
MCH: 31.1 pg (ref 26.0–34.0)
MCHC: 33.4 g/dL (ref 30.0–36.0)
MCV: 93.1 fL (ref 80.0–100.0)
Platelets: 185 10*3/uL (ref 150–400)
RBC: 3.89 MIL/uL — ABNORMAL LOW (ref 4.22–5.81)
RDW: 12.9 % (ref 11.5–15.5)
WBC: 10.3 10*3/uL (ref 4.0–10.5)
nRBC: 0 % (ref 0.0–0.2)

## 2023-10-13 LAB — MAGNESIUM: Magnesium: 2.3 mg/dL (ref 1.7–2.4)

## 2023-10-13 LAB — BASIC METABOLIC PANEL WITH GFR
Anion gap: 11 (ref 5–15)
Anion gap: 10 (ref 5–15)
BUN: 30 mg/dL — ABNORMAL HIGH (ref 8–23)
BUN: 30 mg/dL — ABNORMAL HIGH (ref 8–23)
CO2: 22 mmol/L (ref 22–32)
CO2: 22 mmol/L (ref 22–32)
Calcium: 9 mg/dL (ref 8.9–10.3)
Calcium: 8.8 mg/dL — ABNORMAL LOW (ref 8.9–10.3)
Chloride: 100 mmol/L (ref 98–111)
Chloride: 99 mmol/L (ref 98–111)
Creatinine, Ser: 2.09 mg/dL — ABNORMAL HIGH (ref 0.61–1.24)
Creatinine, Ser: 2.03 mg/dL — ABNORMAL HIGH (ref 0.61–1.24)
GFR, Estimated: 33 mL/min — ABNORMAL LOW (ref >60)
GFR, Estimated: 34 mL/min — ABNORMAL LOW (ref >60)
Glucose, Bld: 164 mg/dL — ABNORMAL HIGH (ref 70–99)
Glucose, Bld: 129 mg/dL — ABNORMAL HIGH (ref 70–99)
Potassium: 3.9 mmol/L (ref 3.5–5.1)
Potassium: 3.8 mmol/L (ref 3.5–5.1)
Sodium: 133 mmol/L — ABNORMAL LOW (ref 135–145)
Sodium: 131 mmol/L — ABNORMAL LOW (ref 135–145)

## 2023-10-13 LAB — GLUCOSE, CAPILLARY
Glucose-Capillary: 181 mg/dL — ABNORMAL HIGH (ref 70–99)
Glucose-Capillary: 132 mg/dL — ABNORMAL HIGH (ref 70–99)
Glucose-Capillary: 137 mg/dL — ABNORMAL HIGH (ref 70–99)

## 2023-10-13 LAB — URINE CULTURE: Culture: NO GROWTH

## 2023-10-13 MED ORDER — SMOG ENEMA
960.0000 mL | Freq: Once | RECTAL | Status: AC
  Administered 2023-10-13: 960 mL via RECTAL
  Filled 2023-10-13: qty 960

## 2023-10-13 MED ORDER — LACTATED RINGERS IV SOLN: INTRAVENOUS | Status: AC

## 2023-10-13 MED ORDER — FLEET ENEMA RE ENEM
1.0000 | ENEMA | Freq: Once | RECTAL | Status: AC
  Administered 2023-10-13: 1 via RECTAL
  Filled 2023-10-13: qty 1

## 2023-10-13 MED ORDER — LIDOCAINE 5 % EX PTCH
1.0000 | MEDICATED_PATCH | Freq: Once | CUTANEOUS | Status: AC
  Administered 2023-10-13: 1 via TRANSDERMAL
  Filled 2023-10-13: qty 1

## 2023-10-13 MED ORDER — FENTANYL CITRATE PF 50 MCG/ML IJ SOSY
12.5000 ug | PREFILLED_SYRINGE | Freq: Once | INTRAMUSCULAR | Status: AC
  Administered 2023-10-13: 12.5 ug via INTRAVENOUS
  Filled 2023-10-13: qty 1

## 2023-10-13 MED ORDER — HYDROCODONE-ACETAMINOPHEN 5-325 MG PO TABS
1.0000 | ORAL_TABLET | ORAL | Status: DC | PRN
  Administered 2023-10-13 – 2023-10-14 (×4): 1 via ORAL
  Filled 2023-10-13 (×5): qty 1

## 2023-10-13 MED ORDER — BISACODYL 10 MG RE SUPP
10.0000 mg | Freq: Once | RECTAL | Status: AC
  Administered 2023-10-13: 10 mg via RECTAL
  Filled 2023-10-13: qty 1

## 2023-10-13 NOTE — Progress Notes (Signed)
 Patient's daughter had concerned about her dad is not having bowel movement. Auscult the pt's abdomen, positive bowel sound. Notified on-call NP. Received new order for Fleet enema. Pt's primary RN just administered enema per NP's order.

## 2023-10-13 NOTE — Plan of Care (Signed)
 ?  Problem: Clinical Measurements: ?Goal: Ability to maintain clinical measurements within normal limits will improve ?Outcome: Progressing ?Goal: Will remain free from infection ?Outcome: Progressing ?Goal: Diagnostic test results will improve ?Outcome: Progressing ?  ?

## 2023-10-13 NOTE — Progress Notes (Addendum)
 Family called and they are concern because the patient did not have any bowel movement. Scheduled miralax  and ducosate was given last night. NP Blondie informed., awaiting for his order. Abdominal assessment done, and there is good bowel sounds. Charge nurse informed and assess the patient.

## 2023-10-13 NOTE — Progress Notes (Signed)
 PROGRESS NOTE    Franklin [name]  FMW:[id number] DOB: 1950-07-12 DOA: 10/12/2023 PCP: Zollie Lowers, MD   Brief Narrative: Franklin [name] is a 73 y.o. male with a history of diabetes mellitus type 2, CAD status post GI, peripheral, COPD, disease, Charcot-Marie-Tooth disease, deafness.  Patient presented secondary to abdominal pain and constipation.  Patient recently seen in the emergency department and diagnosed with acute pyelonephritis, started on antibiotic therapy.  Patient started on a bowel regimen this admission for management of constipation.  Antibiotics continued for management of pyelonephritis.   Assessment and Plan:  Pyelonephritis Recent CT abdomen/pelvis from 6/18 significant for evidence of uncomplicated right-sided pyelonephritis. Patient was given Ceftriaxone  IV x1 and started on Keflex at that time. Urine culture from 6/18 significant for no growth. Blood and urine cultures obtained this admission. Patient started empirically on Ceftriaxone  this admission. Urine culture this admission significant for no growth. Unclear if patient received antibiotics prior to initial urine culture sample. -Continue Ceftriaxone  with eventual transition to PO once blood cultures remain no growth -Follow-up blood cultures  Constipation Per family, this is not a chronic issue. Last bowel movement was on 6/17. Abdominal CT scan on 6/18 significant for evidence of stool retention in the rectosigmoid colon. Patient started on a bowel regimen including MiraLAX  and Senokot-S. Fleet enema given overnight.   AKI on CKD stage IIIa Baseline creatinine of around 1.4.  Creatinine of 1.9 on admission with increased to 2.09 today.  Presumed secondary to poor oral intake, however this is unknown at this time. - Continue IV fluids - Repeat BMP this afternoon and if worsening, will obtain renal ultrasound  Primary hypertension Patient is on lisinopril   CAD s/p PCI Stable. No chest pain. -Continue  aspirin  and Plavix   COPD -Continue DuoNeb as needed  Diabetes mellitus type 2 Well controlled based on hemoglobin A1C of 6.6%  Charcot-Marie-Tooth disease Noted.  Deafness Noted.  Peripheral neuropathy Noted.  Abdominal aortic aneurysm Recent CT abdomen/pelvis identifies a 3.5 cm infrarenal AAA. Recommendation for repeat imaging in 3 years.  BPH Noted on CT scan.   DVT prophylaxis: Lovenox  Code Status:   Code Status: Full Code Family Communication: Wife and two daughters at bedside Disposition Plan: Discharge home likely in 1-2 days pending transition to oral antibiotics and improvement in abdominal pain   Consultants:  None  Procedures:  None  Antimicrobials: Ceftriaxone  IV    Subjective: Patient reports some right sided lower abdominal pain. No nausea or vomiting.  Febrile overnight with Tmax of 100.9 F.  Objective: BP 138/75 (BP Location: Left Arm)   Pulse 85   Temp 98.9 F (37.2 C) (Oral)   Resp 18   Ht 6' 5 (1.956 m)   Wt 81.3 kg   SpO2 94%   BMI 21.25 kg/m   Examination:  General exam: Appears calm and comfortable Respiratory system: Clear to auscultation. Respiratory effort normal. Cardiovascular system: S1 & S2 heard, RRR. No murmurs. Gastrointestinal system: Abdomen is mildly distended, soft and non-tender. Normal bowel sounds heard. Central nervous system: Alert and oriented. No focal neurological deficits. Musculoskeletal: No edema. No calf tenderness Psychiatry: Judgement and insight appear normal. Mood & affect appropriate.    Data Reviewed: I have personally reviewed following labs and imaging studies  CBC Lab Results  Component Value Date   WBC 10.3 10/13/2023   RBC 3.89 (L) 10/13/2023   HGB 12.1 (L) 10/13/2023   HCT 36.2 (L) 10/13/2023   MCV 93.1 10/13/2023   MCH 31.1 10/13/2023  PLT 185 10/13/2023   MCHC 33.4 10/13/2023   RDW 12.9 10/13/2023   LYMPHSABS 1.9 03/06/2023   MONOABS 0.7 12/03/2007   EOSABS 0.1 03/06/2023    BASOSABS 0.0 03/06/2023     Last metabolic panel Lab Results  Component Value Date   NA 133 (L) 10/13/2023   K 3.9 10/13/2023   CL 100 10/13/2023   CO2 22 10/13/2023   BUN 30 (H) 10/13/2023   CREATININE 2.09 (H) 10/13/2023   GLUCOSE 164 (H) 10/13/2023   GFRNONAA 33 (L) 10/13/2023   GFRAA 57 (L) 03/29/2020   CALCIUM 9.0 10/13/2023   PROT 8.2 (H) 10/11/2023   ALBUMIN 4.9 10/11/2023   LABGLOB 2.6 08/28/2023   AGRATIO 1.8 07/25/2022   BILITOT 0.6 10/11/2023   ALKPHOS 75 10/11/2023   AST 26 10/11/2023   ALT 22 10/11/2023   ANIONGAP 11 10/13/2023    GFR: Estimated Creatinine Clearance: 36.2 mL/min (A) (by C-G formula based on SCr of 2.09 mg/dL (H)).  Recent Results (from the past 240 hours)  Urine Culture     Status: None   Collection Time: 10/09/23  2:45 PM   Specimen: Urine, Clean Catch  Result Value Ref Range Status   Specimen Description   Final    URINE, CLEAN CATCH Performed at [facility], [address], [place], [place] [address]    Special Requests   Final    NONE Performed at [facility], [address] [place], [place] [address]    Culture   Final    NO GROWTH Performed at [facility], [address] [place], [place] [address]    Report Status 10/10/2023 FINAL  Final  Culture, blood (Routine X 2) w Reflex to ID Panel     Status: None (Preliminary result)   Collection Time: 10/12/23  2:33 AM   Specimen: BLOOD RIGHT FOREARM  Result Value Ref Range Status   Specimen Description   Final    BLOOD RIGHT FOREARM Performed at [facility], [address] [place], [place] [address]    Special Requests   Final    BOTTLES DRAWN AEROBIC AND ANAEROBIC Blood Culture results may not be optimal due to an inadequate volume of blood received in culture bottles Performed at [facility], [address], [place], [place] [address]    Culture PENDING  Incomplete   Report Status PENDING   Incomplete      Radiology Studies: No results found.    LOS: 1 day    Elgin Lam, MD [facility] 10/13/2023, 7:49 AM   If 7PM-7AM, please contact night-coverage [email or web address]

## 2023-10-13 NOTE — Hospital Course (Signed)
 Franklin [name] is a 73 y.o. male with a history of diabetes mellitus type 2, CAD status post GI, peripheral, COPD, disease, Charcot-Marie-Tooth disease, deafness.  Patient presented secondary to abdominal pain and constipation.  Patient recently seen in the emergency department and diagnosed with acute pyelonephritis, started on antibiotic therapy.  Patient started on a bowel regimen this admission for management of constipation.  Antibiotics continued for management of pyelonephritis.

## 2023-10-13 NOTE — Plan of Care (Signed)

## 2023-10-14 ENCOUNTER — Inpatient Hospital Stay (HOSPITAL_COMMUNITY)

## 2023-10-14 ENCOUNTER — Encounter (HOSPITAL_COMMUNITY): Payer: Self-pay | Admitting: Internal Medicine

## 2023-10-14 DIAGNOSIS — K5900 Constipation, unspecified: Secondary | ICD-10-CM | POA: Diagnosis not present

## 2023-10-14 DIAGNOSIS — R310 Gross hematuria: Secondary | ICD-10-CM | POA: Diagnosis not present

## 2023-10-14 DIAGNOSIS — K567 Ileus, unspecified: Secondary | ICD-10-CM | POA: Diagnosis not present

## 2023-10-14 DIAGNOSIS — N10 Acute tubulo-interstitial nephritis: Secondary | ICD-10-CM | POA: Diagnosis not present

## 2023-10-14 LAB — URINALYSIS, ROUTINE W REFLEX MICROSCOPIC

## 2023-10-14 LAB — CBC
HCT: 34.9 % — ABNORMAL LOW (ref 39.0–52.0)
Hemoglobin: 11.3 g/dL — ABNORMAL LOW (ref 13.0–17.0)
MCH: 30.5 pg (ref 26.0–34.0)
MCHC: 32.4 g/dL (ref 30.0–36.0)
MCV: 94.3 fL (ref 80.0–100.0)
Platelets: 205 10*3/uL (ref 150–400)
RBC: 3.7 MIL/uL — ABNORMAL LOW (ref 4.22–5.81)
RDW: 12.7 % (ref 11.5–15.5)
WBC: 8.5 10*3/uL (ref 4.0–10.5)
nRBC: 0 % (ref 0.0–0.2)

## 2023-10-14 LAB — GLUCOSE, CAPILLARY
Glucose-Capillary: 155 mg/dL — ABNORMAL HIGH (ref 70–99)
Glucose-Capillary: 141 mg/dL — ABNORMAL HIGH (ref 70–99)
Glucose-Capillary: 137 mg/dL — ABNORMAL HIGH (ref 70–99)
Glucose-Capillary: 142 mg/dL — ABNORMAL HIGH (ref 70–99)

## 2023-10-14 LAB — URINALYSIS, MICROSCOPIC (REFLEX)
Bacteria, UA: NONE SEEN
RBC / HPF: >50 RBC/hpf (ref 0–5)

## 2023-10-14 LAB — BASIC METABOLIC PANEL WITH GFR
Anion gap: 12 (ref 5–15)
BUN: 31 mg/dL — ABNORMAL HIGH (ref 8–23)
CO2: 22 mmol/L (ref 22–32)
Calcium: 9.1 mg/dL (ref 8.9–10.3)
Chloride: 101 mmol/L (ref 98–111)
Creatinine, Ser: 2.01 mg/dL — ABNORMAL HIGH (ref 0.61–1.24)
GFR, Estimated: 34 mL/min — ABNORMAL LOW (ref >60)
Glucose, Bld: 132 mg/dL — ABNORMAL HIGH (ref 70–99)
Potassium: 3.8 mmol/L (ref 3.5–5.1)
Sodium: 135 mmol/L (ref 135–145)

## 2023-10-14 LAB — HEMOGLOBIN AND HEMATOCRIT, BLOOD
HCT: 37.8 % — ABNORMAL LOW (ref 39.0–52.0)
Hemoglobin: 12.3 g/dL — ABNORMAL LOW (ref 13.0–17.0)

## 2023-10-14 MED ORDER — DIATRIZOATE MEGLUMINE & SODIUM 66-10 % PO SOLN
90.0000 mL | Freq: Once | ORAL | Status: AC
  Administered 2023-10-14: 90 mL via ORAL
  Filled 2023-10-14: qty 90

## 2023-10-14 MED ORDER — POTASSIUM CHLORIDE CRYS ER 20 MEQ PO TBCR
40.0000 meq | EXTENDED_RELEASE_TABLET | Freq: Once | ORAL | Status: AC
  Administered 2023-10-14: 40 meq via ORAL
  Filled 2023-10-14: qty 2

## 2023-10-14 NOTE — Progress Notes (Addendum)
 NP on call Blondie informed that patient is having a bloody urine output. -No new order

## 2023-10-14 NOTE — Consult Note (Signed)
 Urology Consult Note   Requesting Attending Physician:  Briana Elgin LABOR, MD Service Providing Consult: Urology  Consulting Attending: Dr. Shane   Reason for Consult:  hematuria, renal stone  HPI: Franklin [name] is seen in consultation for reasons noted above at the request of Briana Elgin LABOR, MD. patient is a 73 year old male with PMH significant for T2DM, CAD-with DES, COPD, Charcot-Marie-Tooth disease, and deafness.  He presents with right side abdominal pain and severe constipation.  He has failed multiple laxatives  ------------------  Assessment:   73 y.o. deaf male with intermittent hematuria, possible renal stone, and constipation   Recommendations: # Hematuria # BPH  Patient reports this comes and goes and does not endorse any lower urinary tract symptoms.  CT A/P with contrast shows no obvious lesions which would be responsible for hematuria.  Enlarged prostate, roughly 78cc ellipsoid with median lobe and intravesical involvement.  Noted nonobstructing 3 mm stone does not appear to be in the collecting system.  Independently reviewed by myself and Dr. Shane.  Will follow-up in clinic for flexible cystoscopy.  His hematuria is compounded by Lovenox , which is being held and Plavix , which cannot be held d/t coronary stents. Trend labs.   Will not need to follow, please call with questions.  Case and plan discussed with Dr. Shane  Past Medical History: Past Medical History:  Diagnosis Date   Charcot-Marie-Tooth disease    COPD (chronic obstructive pulmonary disease) (HCC)    has smokers cough   Coronary artery disease 2009   MI-stents x2   Coronary atherosclerosis of native coronary artery    History of inferior myocardial infarction, treated with   CYPHER stenting of circumflex and right coronary artery, February 2004.Status post non-ST segment myocardial infarction treated with PROMUS drug-eluting stenting of distal right coronary artery and posterior  descending (coronary) artery, August 2009.  EF 55-60%. Non-ischemic exercise Myoview; EF 50%, October 2009.     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   Procedure: COCHLEAR IMPLANT;  Surgeon: Karis Clunes, MD;  Location: [facility];  Service: ENT;  Laterality: Left;   HERNIA REPAIR  80's    Medication: Current Facility-Administered Medications  Medication Dose Route Frequency Provider Last Rate Last Admin   acetaminophen  (TYLENOL ) tablet 650 mg  650 mg Oral Q6H PRN Amponsah, Prosper M, MD   650 mg at 10/13/23 1146   Or   acetaminophen  (TYLENOL ) suppository 650 mg  650 mg Rectal Q6H PRN Lou Claretta HERO, MD       aspirin  EC tablet 81 mg  81 mg Oral Daily Lou Claretta HERO, MD   81 mg at 10/13/23 1022   cefTRIAXone  (ROCEPHIN ) 2 g in sodium chloride  0.9 % 100 mL IVPB  2 g Intravenous Daily Debby Hitch A, MD 200 mL/hr at 10/13/23 1150 2 g at 10/13/23 1150   clopidogrel  (PLAVIX ) tablet 75 mg  75 mg Oral Daily Lou Claretta HERO, MD   75 mg at 10/13/23 1022   hydrALAZINE (APRESOLINE) injection 10 mg  10 mg Intravenous Q6H PRN Amponsah, Prosper M, MD       HYDROcodone-acetaminophen  (NORCO/VICODIN) 5-325 MG per  tablet 1 tablet  1 tablet Oral Q4H PRN Briana Elgin LABOR, MD   1 tablet at 10/14/23 0749   insulin aspart (novoLOG) injection 0-15 Units  0-15 Units Subcutaneous TID WC Amponsah, Prosper M, MD   3 Units at 10/14/23 0743   ipratropium-albuterol (DUONEB) 0.5-2.5 (3) MG/3ML nebulizer solution 3 mL  3 mL Nebulization Q6H PRN Lou Claretta HERO, MD       ondansetron  (ZOFRAN ) tablet 4 mg  4 mg Oral Q6H PRN Amponsah, Prosper M, MD       Or   ondansetron  (ZOFRAN ) injection 4 mg  4 mg  Intravenous Q6H PRN Amponsah, Prosper M, MD       polyethylene glycol (MIRALAX  / GLYCOLAX ) packet 17 g  17 g Oral BID Countryman, Chase, MD   17 g at 10/13/23 2217   senna-docusate (Senokot-S) tablet 2 tablet  2 tablet Oral BID Lou Claretta HERO, MD   2 tablet at 10/13/23 2216    Allergies: Allergies  Allergen Reactions   Statins Other (See Comments)    Myalgia    Social History: Social History   Tobacco Use   Smoking status: Former    Current packs/day: 1.00    Average packs/day: 1 pack/day for 57.9 years (57.9 ttl pk-yrs)    Types: Cigarettes    Start date: 12/05/1965   Smokeless tobacco: Current   Tobacco comments:    Did quit for a while but started again.  Not as heavy  Vaping Use   Vaping status: Every Day   Substances: Nicotine  Substance Use Topics   Alcohol use: No    Alcohol/week: 0.0 standard drinks of alcohol   Drug use: No    Family History Family History  Problem Relation Age of Onset   Alzheimer's disease Mother    Charcot-Marie-Tooth disease Sister    Charcot-Marie-Tooth disease Daughter    Hearing loss Son    Stroke Son     Review of Systems  Genitourinary:  Positive for flank pain and hematuria. Negative for dysuria, frequency and urgency.     Objective   Vital signs in last 24 hours: BP (!) 137/100 (BP Location: Left Arm)   Pulse 89   Temp 98.2 F (36.8 C) (Oral)   Resp 18   Ht 6' 5 (1.956 m)   Wt 81.3 kg   SpO2 94%   BMI 21.25 kg/m   Physical Exam General: A&O, resting, appropriate HEENT: [place]/AT Pulmonary: Normal work of breathing Cardiovascular: no cyanosis   Most Recent Labs: Lab Results  Component Value Date   WBC 8.5 10/14/2023   HGB 11.3 (L) 10/14/2023   HCT 34.9 (L) 10/14/2023   PLT 205 10/14/2023    Lab Results  Component Value Date   NA 135 10/14/2023   K 3.8 10/14/2023   CL 101 10/14/2023   CO2 22 10/14/2023   BUN 31 (H) 10/14/2023   CREATININE 2.01 (H) 10/14/2023   CALCIUM 9.1 10/14/2023   MG 2.3  10/13/2023    Lab Results  Component Value Date   INR 1.1 12/03/2007   APTT (H) 12/03/2007    157        IF BASELINE aPTT IS ELEVATED, SUGGEST PATIENT RISK ASSESSMENT BE USED TO DETERMINE APPROPRIATE ANTICOAGULANT THERAPY.     Urine Culture: @[id number] (laburin,org,[id number])@   IMAGING: DG Abd 2 Views Result Date: 10/13/2023 CLINICAL DATA:  Abdominal distention and constipation EXAM: ABDOMEN - 2 VIEW COMPARISON:  CT abdomen and pelvis dated 10/09/2023 FINDINGS: Mild gas-filled dilation of transverse  colon. No free air or pneumatosis. No abnormal radio-opaque calculi or mass effect. No acute or substantial osseous abnormality. The sacrum and coccyx are partially obscured by overlying bowel contents. Partially imaged lung bases are clear. IMPRESSION: Mild gas-filled dilation of transverse colon, likely ileus. Bowel gas is seen to the level of the rectum. Electronically Signed   By: Limin  Xu M.D.   On: 10/13/2023 15:17    ------  Ole Bourdon, NP Pager: [phone]   Please contact the urology consult pager with any further questions/concerns.

## 2023-10-14 NOTE — Plan of Care (Signed)
  Problem: Clinical Measurements: Goal: Ability to maintain clinical measurements within normal limits will improve Outcome: Progressing Goal: Will remain free from infection Outcome: Progressing Goal: Diagnostic test results will improve Outcome: Progressing   Problem: Activity: Goal: Risk for activity intolerance will decrease Outcome: Progressing   Problem: Nutrition: Goal: Adequate nutrition will be maintained Outcome: Progressing   

## 2023-10-14 NOTE — Progress Notes (Addendum)
 PROGRESS NOTE    TAYLAN MAREZ  FMW:[id number] DOB: 03/28/1951 DOA: 10/12/2023 PCP: Zollie Lowers, MD   Brief Narrative: Franklin [name] is a 73 y.o. male with a history of diabetes mellitus type 2, CAD status post GI, peripheral, COPD, disease, Charcot-Marie-Tooth disease, deafness.  Patient presented secondary to abdominal pain and constipation.  Patient recently seen in the emergency department and diagnosed with acute pyelonephritis, started on antibiotic therapy.  Patient started on a bowel regimen this admission for management of constipation.  Antibiotics continued for management of pyelonephritis.   Assessment and Plan:  Pyelonephritis Recent CT abdomen/pelvis from 6/18 significant for evidence of uncomplicated right-sided pyelonephritis. Patient was given Ceftriaxone  IV x1 and started on Keflex at that time. Urine culture from 6/18 significant for no growth. Blood and urine cultures obtained this admission. Patient started empirically on Ceftriaxone  this admission. Urine culture this admission significant for no growth. Unclear if patient received antibiotics prior to initial urine culture sample. -Continue Ceftriaxone  with eventual transition to PO once blood cultures remain no growth -Follow-up blood cultures  Constipation Ileus Per family, this is not a chronic issue. Last bowel movement was on 6/17. Abdominal CT scan on 6/18 significant for evidence of stool retention in the rectosigmoid colon. Patient started on a bowel regimen including MiraLAX  and Senokot-S. Fleet and SMOG enemas administered with some minimal output. Repeat abdominal x-ray obtained and was significant for ileus. -NPO -General surgery consult  AKI on CKD stage IIIa Baseline creatinine of around 1.4.  Creatinine of 1.9 on admission with increased to a peak of 2.09.  Presumed secondary to poor oral intake, however this is unknown at this time; patient has also had a recent CT abd/pelvis with contrast.  Patient with associated gross hematuria. No casts noted on urine microscopy. Creatinine trending down slowly. - Continue IV fluids - Repeat urinalysis pending  Gross hematuria New problem. Complicated by Aspirin  and Plavix  with history of BPH. Slight drop in hemoglobin. Patient never received Lovenox  this admission. -Repeat urinalysis -Urology consultation  Primary hypertension Patient is on lisinopril  which is held secondary to AKI. Blood pressure uncontrolled. -Continue hydralazine IV as needed  CAD s/p PCI Stable. No chest pain. History of stent x2 from 2009 -Continue aspirin  and Plavix ; will discuss with cardiology of patient can be managed on monotherapy  Addendum: Discussed with patient's primary cardiologist who recommended Plavix  monotherapy, but was okay with aspirin  monotherapy is needed for procedures. Preference is Plavix  monotherapy, however.  COPD -Continue DuoNeb as needed  Diabetes mellitus type 2 Well controlled based on hemoglobin A1C of 6.6%  Charcot-Marie-Tooth disease Noted.  Deafness Noted.  Peripheral neuropathy Noted.  Abdominal aortic aneurysm Recent CT abdomen/pelvis identifies a 3.5 cm infrarenal AAA. Recommendation for repeat imaging in 3 years.  BPH Noted on CT scan.   DVT prophylaxis: Lovenox  Code Status:   Code Status: Full Code Family Communication: Wife, daughter, grandson and son at bedside Disposition Plan: Discharge home likely in 2-3 pending transition to oral antibiotics and improvement in, AKI, abdominal pain in addition to General surgery and Urology recommendations.   Consultants:  General surgery Urology  Procedures:  None  Antimicrobials: Ceftriaxone  IV   Subjective: Afebrile overnight. Gross hematuria noted by nursing. No issues per patient.  Objective: BP (!) 137/100 (BP Location: Left Arm)   Pulse 89   Temp 98.2 F (36.8 C) (Oral)   Resp 18   Ht 6' 5 (1.956 m)   Wt 81.3 kg   SpO2 94%  BMI 21.25  kg/m   Examination:  General exam: Appears calm and comfortable Respiratory system: Clear to auscultation. Respiratory effort normal. Cardiovascular system: S1 & S2 heard, RRR. No murmurs, rubs, gallops or clicks. Gastrointestinal system: Abdomen is distended, soft and nontender. Normal bowel sounds heard. Central nervous system: Alert and oriented. No focal neurological deficits. Psychiatry: Judgement and insight appear normal. Mood & affect appropriate.    Data Reviewed: I have personally reviewed following labs and imaging studies  CBC Lab Results  Component Value Date   WBC 8.5 10/14/2023   RBC 3.70 (L) 10/14/2023   HGB 11.3 (L) 10/14/2023   HCT 34.9 (L) 10/14/2023   MCV 94.3 10/14/2023   MCH 30.5 10/14/2023   PLT 205 10/14/2023   MCHC 32.4 10/14/2023   RDW 12.7 10/14/2023   LYMPHSABS 1.9 03/06/2023   MONOABS 0.7 12/03/2007   EOSABS 0.1 03/06/2023   BASOSABS 0.0 03/06/2023     Last metabolic panel Lab Results  Component Value Date   NA 135 10/14/2023   K 3.8 10/14/2023   CL 101 10/14/2023   CO2 22 10/14/2023   BUN 31 (H) 10/14/2023   CREATININE 2.01 (H) 10/14/2023   GLUCOSE 132 (H) 10/14/2023   GFRNONAA 34 (L) 10/14/2023   GFRAA 57 (L) 03/29/2020   CALCIUM 9.1 10/14/2023   PROT 8.2 (H) 10/11/2023   ALBUMIN 4.9 10/11/2023   LABGLOB 2.6 08/28/2023   AGRATIO 1.8 07/25/2022   BILITOT 0.6 10/11/2023   ALKPHOS 75 10/11/2023   AST 26 10/11/2023   ALT 22 10/11/2023   ANIONGAP 12 10/14/2023    GFR: Estimated Creatinine Clearance: 37.6 mL/min (A) (by C-G formula based on SCr of 2.01 mg/dL (H)).  Recent Results (from the past 240 hours)  Urine Culture     Status: None   Collection Time: 10/09/23  2:45 PM   Specimen: Urine, Clean Catch  Result Value Ref Range Status   Specimen Description   Final    URINE, CLEAN CATCH Performed at [facility], [address] [place], [place] [address]    Special Requests   Final    NONE Performed  at [facility], [address] [place], [place] [address]    Culture   Final    NO GROWTH Performed at [facility], [address] [place], [place] [address]    Report Status 10/10/2023 FINAL  Final  Urine Culture     Status: None   Collection Time: 10/12/23  1:28 AM   Specimen: Urine, Clean Catch  Result Value Ref Range Status   Specimen Description   Final    URINE, CLEAN CATCH Performed at [facility], [address], [place], [place] [address]    Special Requests   Final    NONE Performed at [facility], [address], [place], [place] [address]    Culture   Final    NO GROWTH Performed at [facility], [address], [place], [place] [address]    Report Status 10/13/2023 FINAL  Final  Culture, blood (Routine X 2) w Reflex to ID Panel     Status: None (Preliminary result)   Collection Time: 10/12/23  2:28 AM   Specimen: BLOOD  Result Value Ref Range Status   Specimen Description   Final    BLOOD RIGHT ANTECUBITAL Performed at [facility], [address] [place], [place] [address]    Special Requests   Final    BOTTLES DRAWN AEROBIC  AND ANAEROBIC Blood Culture results may not be optimal due to an inadequate volume of blood received in culture bottles Performed at [facility], [address], [place], [place] [address]    Culture   Final    NO GROWTH < 24 HOURS Performed at [facility], [address], [place], [place] [address]    Report Status PENDING  Incomplete  Culture, blood (Routine X 2) w Reflex to ID Panel     Status: None (Preliminary result)   Collection Time: 10/12/23  2:33 AM   Specimen: BLOOD RIGHT FOREARM  Result Value Ref Range Status   Specimen Description   Final    BLOOD RIGHT FOREARM Performed at [facility], [address], [place], [place] [address]    Special Requests   Final    BOTTLES DRAWN AEROBIC AND  ANAEROBIC Blood Culture results may not be optimal due to an inadequate volume of blood received in culture bottles Performed at [facility], [address], [place], [place] [address]    Culture   Final    NO GROWTH 1 DAY Performed at [facility], [address], [place], [place] [address]    Report Status PENDING  Incomplete      Radiology Studies: DG Abd 2 Views Result Date: 10/13/2023 CLINICAL DATA:  Abdominal distention and constipation EXAM: ABDOMEN - 2 VIEW COMPARISON:  CT abdomen and pelvis dated 10/09/2023 FINDINGS: Mild gas-filled dilation of transverse colon. No free air or pneumatosis. No abnormal radio-opaque calculi or mass effect. No acute or substantial osseous abnormality. The sacrum and coccyx are partially obscured by overlying bowel contents. Partially imaged lung bases are clear. IMPRESSION: Mild gas-filled dilation of transverse colon, likely ileus. Bowel gas is seen to the level of the rectum. Electronically Signed   By: Limin  Xu M.D.   On: 10/13/2023 15:17      LOS: 2 days    Elgin Lam, MD [facility] 10/14/2023, 7:21 AM   If 7PM-7AM, please contact night-coverage [email or web address]

## 2023-10-14 NOTE — Consult Note (Signed)
 KELVIN SENNETT 10-Jan-1951  [id number].    Requesting MD: Briana, MD Chief Complaint/Reason for Consult: constipation vs. ileus, rule out obstruction   HPI:  Franklin [name] is a 73 y/o male with multiple medical problems, listed below, who was admitted to the hospital for management of pyelonephritis. 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 She tells me that they were seen at [facility] for gross hematuria on 6/18, diagnosed with a UTI/pyelonephritis, and sent home with Keflex. She tells me that on 6/20 the patient had worsening right sided abdominal pain and reported no flatus/BM since 6/17 and asked to be taken to the ED. ED workup was significant for pyelonephritis and constipation and he was admitted for further management. The patient rates his right flank pain as 7/10 and it is partially relieved by pain meds. His urine was originally red, then became more clear/yellow, and this morning is tea-colored. He has been given miralax  2-3 times daily for the last 48 hours, along with stool softeners, and 2 enemas without a BM. He started passing gas this morning and ate food yesterday without nausea or vomiting.   Surgical history is significant for appendectomy ([id number]'d) and hernia repair (1980's). The patient has never had a colonoscopy. He is on DAPT for history of CAD/stent. He used to smoke cigarettes and is now using a vape. According to his wife he does not like the doctor and she has to schedule his appointments and accompany him to his appointments or he will not go. His Son is at the bedside and worked as a Biochemist, clinical for 20 years. Patient also has a daughter who is not at the bedside during my examination.   ROS: Review of Systems  All other systems reviewed and are negative.   Family History  Problem Relation Age of Onset   Alzheimer's disease Mother    Charcot-Marie-Tooth disease Sister    Charcot-Marie-Tooth  disease Daughter    Hearing loss Son    Stroke Son     Past Medical History:  Diagnosis Date   Charcot-Marie-Tooth disease    COPD (chronic obstructive pulmonary disease) (HCC)    has smokers cough   Coronary artery disease 2009   MI-stents x2   Coronary atherosclerosis of native coronary artery    History of inferior myocardial infarction, treated with   CYPHER stenting of circumflex and right coronary artery, February 2004.Status post non-ST segment myocardial infarction treated with PROMUS drug-eluting stenting of distal right coronary artery and posterior descending (coronary) artery, August 2009.  EF 55-60%. Non-ischemic exercise Myoview; EF 50%, October 2009.     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 His smoking use included cigarettes. He started smoking about 57 years ago. He has a 57.9 pack-year smoking history. He uses smokeless tobacco. He reports that he does not drink alcohol and does not use drugs.  Allergies:  Allergies  Allergen Reactions   Statins Other (See Comments)    Myalgia    Medications Prior to Admission  Medication Sig Dispense Refill   aspirin  EC 81 MG tablet Take 81 mg by mouth daily.     cephALEXin (KEFLEX) 500 MG capsule Take 500 mg by mouth 2 (two) times daily.     clopidogrel  (PLAVIX ) 75 MG tablet Take 1 tablet (75 mg total) by mouth daily. 100 tablet 0   lisinopril  (ZESTRIL ) 10 MG tablet Take 1 tablet (10 mg total) by mouth daily. 90 tablet 3   metFORMIN  (GLUCOPHAGE ) 500 MG tablet  Take 1 tablet (500 mg total) by mouth 2 (two) times daily with a meal. 200 tablet 0   nitroGLYCERIN  (NITROSTAT ) 0.4 MG SL tablet Place 1 tablet (0.4 mg total) under the tongue every 5 (five) minutes as needed for chest pain. 75 tablet 3   sildenafil  (REVATIO ) 20 MG tablet Take 2-5 pills at once, orally, with each sexual encounter (Patient taking differently: Take 40 mg by mouth as needed (ED).) 50 tablet 5   glucose blood (ONE TOUCH TEST STRIPS) test strip Test 3 times daily   E11.9 100 each 12   ONE TOUCH LANCETS MISC 1 each by Does not apply route 2 (two) times daily. 100 each 11     Physical Exam: Blood pressure (!) 137/100, pulse 89, temperature 98.2 F (36.8 C), temperature source Oral, resp. rate 18, height 6' 5 (1.956 m), weight 81.3 kg, SpO2 94%. General: Pleasant white male laying on hospital bed, appears stated age, NAD. HEENT: head -normocephalic, atraumatic; Eyes: PERRLA, no conjunctival injection CV- RRR, normal S1/S2, no M/R/G, no lower extremity edema  Pulm- breathing is non-labored ORA Abd- soft, non-tender, non-distended, the is R CVA tenderness  GU- deferred  MSK- UE/LE symmetrical, no cyanosis, clubbing, or edema. Neuro- nonfocal exam Psych-appropriate affect Skin: warm and dry, no rashes or lesions   Results for orders placed or performed during the hospital encounter of 10/12/23 (from the past 48 hours)  Glucose, capillary     Status: Abnormal   Collection Time: 10/12/23 11:18 AM  Result Value Ref Range   Glucose-Capillary 198 (H) 70 - 99 mg/dL    Comment: Glucose reference range applies only to samples taken after fasting for at least 8 hours.  CBC     Status: Abnormal   Collection Time: 10/13/23  5:22 AM  Result Value Ref Range   WBC 10.3 4.0 - 10.5 K/uL   RBC 3.89 (L) 4.22 - 5.81 MIL/uL   Hemoglobin 12.1 (L) 13.0 - 17.0 g/dL   HCT 63.7 (L) 60.9 - 47.9 %   MCV 93.1 80.0 - 100.0 fL   MCH 31.1 26.0 - 34.0 pg   MCHC 33.4 30.0 - 36.0 g/dL   RDW 87.0 88.4 -  84.4 %   Platelets 185 150 - 400 K/uL   nRBC 0.0 0.0 - 0.2 %    Comment: Performed at [facility], [address], [place], [place] [address]  Basic metabolic panel     Status: Abnormal   Collection Time: 10/13/23  5:22 AM  Result Value Ref Range   Sodium 133 (L) 135 - 145 mmol/L   Potassium 3.9 3.5 - 5.1 mmol/L   Chloride 100 98 - 111 mmol/L   CO2 22 22 - 32 mmol/L   Glucose, Bld 164 (H) 70 - 99 mg/dL    Comment: Glucose reference range applies only to samples taken after fasting for  at least 8 hours.   BUN 30 (H) 8 - 23 mg/dL   Creatinine, Ser 7.90 (H) 0.61 - 1.24 mg/dL   Calcium 9.0 8.9 - 89.6 mg/dL   GFR, Estimated 33 (L) >60 mL/min    Comment: (NOTE) Calculated using the CKD-EPI Creatinine Equation (2021)    Anion gap 11 5 - 15    Comment: Performed at [facility], [address], [place], [place] [address]  Glucose, capillary     Status: Abnormal   Collection Time: 10/13/23  7:22 AM  Result Value Ref Range   Glucose-Capillary 181 (H) 70 - 99 mg/dL    Comment: Glucose reference range applies only to samples taken after fasting for at least 8 hours.  Glucose, capillary     Status: Abnormal   Collection Time: 10/13/23 11:29 AM  Result Value Ref Range   Glucose-Capillary 132 (H) 70 - 99 mg/dL    Comment: Glucose reference range applies only to samples taken after fasting for at least 8 hours.  Basic metabolic panel     Status: Abnormal   Collection Time: 10/13/23  3:37 PM  Result Value Ref Range   Sodium 131 (L) 135 - 145 mmol/L   Potassium 3.8 3.5 - 5.1 mmol/L   Chloride 99 98 - 111 mmol/L   CO2 22 22 - 32 mmol/L   Glucose, Bld 129 (H) 70 - 99 mg/dL    Comment: Glucose reference range applies only to samples taken after fasting for at least 8 hours.   BUN 30 (H) 8 - 23 mg/dL   Creatinine, Ser 7.96 (H) 0.61 - 1.24 mg/dL   Calcium 8.8 (L) 8.9 - 10.3 mg/dL   GFR, Estimated 34 (L) >60 mL/min    Comment: (NOTE) Calculated using the  CKD-EPI Creatinine Equation (2021)    Anion gap 10 5 - 15    Comment: Performed at [facility], [address] [place], [place] [address]  Magnesium     Status: None   Collection Time: 10/13/23  6:05 PM  Result Value Ref Range   Magnesium 2.3 1.7 - 2.4 mg/dL    Comment: Performed at [facility], [address], [place], [place] [address]  Glucose, capillary     Status: Abnormal   Collection Time: 10/13/23  9:05 PM  Result Value Ref Range   Glucose-Capillary 137 (H) 70 - 99 mg/dL    Comment: Glucose reference range applies only to samples taken after fasting for at least 8 hours.  Basic metabolic panel with GFR     Status: Abnormal   Collection Time: 10/14/23  4:42 AM  Result Value Ref Range   Sodium 135 135 - 145 mmol/L   Potassium 3.8 3.5 - 5.1 mmol/L   Chloride 101 98 - 111 mmol/L   CO2 22 22 - 32 mmol/L   Glucose, Bld 132 (H) 70 - 99 mg/dL    Comment: Glucose reference range applies only to samples taken after fasting for at least 8 hours.   BUN 31 (H) 8 - 23 mg/dL   Creatinine, Ser 7.98 (H) 0.61 - 1.24 mg/dL   Calcium 9.1 8.9 - 89.6 mg/dL   GFR, Estimated 34 (L) >60 mL/min    Comment: (NOTE) Calculated using the CKD-EPI Creatinine Equation (2021)    Anion gap 12 5 - 15    Comment: Performed at [facility], [address] [place], [place] [address]  CBC     Status: Abnormal  Collection Time: 10/14/23  4:42 AM  Result Value Ref Range   WBC 8.5 4.0 - 10.5 K/uL   RBC 3.70 (L) 4.22 - 5.81 MIL/uL   Hemoglobin 11.3 (L) 13.0 - 17.0 g/dL   HCT 65.0 (L) 60.9 - 47.9 %   MCV 94.3 80.0 - 100.0 fL   MCH 30.5 26.0 - 34.0 pg   MCHC 32.4 30.0 - 36.0 g/dL   RDW 87.2 88.4 - 84.4 %   Platelets 205 150 - 400 K/uL   nRBC 0.0 0.0 - 0.2 %    Comment: Performed at [facility], [address] [place], [place] [address]  Urinalysis, Routine w reflex microscopic -Urine, Clean Catch     Status: Abnormal    Collection Time: 10/14/23  7:00 AM  Result Value Ref Range   Color, Urine RED (A) YELLOW    Comment: BIOCHEMICALS MAY BE AFFECTED BY COLOR   APPearance TURBID (A) CLEAR   Specific Gravity, Urine  1.005 - 1.030    TEST NOT REPORTED DUE TO COLOR INTERFERENCE OF URINE PIGMENT   pH  5.0 - 8.0    TEST NOT REPORTED DUE TO COLOR INTERFERENCE OF URINE PIGMENT   Glucose, UA (A) NEGATIVE mg/dL    TEST NOT REPORTED DUE TO COLOR INTERFERENCE OF URINE PIGMENT   Hgb urine dipstick (A) NEGATIVE    TEST NOT REPORTED DUE TO COLOR INTERFERENCE OF URINE PIGMENT   Bilirubin Urine (A) NEGATIVE    TEST NOT REPORTED DUE TO COLOR INTERFERENCE OF URINE PIGMENT   Ketones, ur (A) NEGATIVE mg/dL    TEST NOT REPORTED DUE TO COLOR INTERFERENCE OF URINE PIGMENT   Protein, ur (A) NEGATIVE mg/dL    TEST NOT REPORTED DUE TO COLOR INTERFERENCE OF URINE PIGMENT   Nitrite (A) NEGATIVE    TEST NOT REPORTED DUE TO COLOR INTERFERENCE OF URINE PIGMENT   Leukocytes,Ua (A) NEGATIVE    TEST NOT REPORTED DUE TO COLOR INTERFERENCE OF URINE PIGMENT    Comment: Performed at [facility], [address], [place], [place] [address]  Urinalysis, Microscopic (reflex)     Status: None   Collection Time: 10/14/23  7:00 AM  Result Value Ref Range   RBC / HPF >50 0 - 5 RBC/hpf   WBC, UA 6-10 0 - 5 WBC/hpf   Bacteria, UA NONE SEEN NONE SEEN   Squamous Epithelial / HPF 0-5 0 - 5 /HPF    Comment: Performed at [facility], [address] [place], [place] [address]  Glucose, capillary     Status: Abnormal   Collection Time: 10/14/23  7:33 AM  Result Value Ref Range   Glucose-Capillary 155 (H) 70 - 99 mg/dL    Comment: Glucose reference range applies only to samples taken after fasting for at least 8 hours.   DG Abd 2 Views Result Date: 10/13/2023 CLINICAL DATA:  Abdominal distention and constipation EXAM: ABDOMEN - 2 VIEW COMPARISON:  CT abdomen and pelvis dated 10/09/2023 FINDINGS: Mild gas-filled  dilation of transverse colon. No free air or pneumatosis. No abnormal radio-opaque calculi or mass effect. No acute or substantial osseous abnormality. The sacrum and coccyx are partially obscured by overlying bowel contents. Partially imaged lung bases are clear. IMPRESSION: Mild gas-filled dilation of transverse colon, likely ileus. Bowel gas is seen to the level of the rectum. Electronically Signed   By: Limin  Xu M.D.   On: 10/13/2023 15:17      Assessment/Plan Constipation, possible ileus, in  the setting of acute right pyelonephritis and narcotic pain medication  - No clinical or radiographic evidence of small bowel obstruction   - PO SBO protocol with gastrografin to completely rule out obstruction and for therapeutic laxative effect.  - no emergent surgical needs, I suspect this is an ileus, hopefully will resolve with conservative measures (stool softeners, laxatives, suppositories, supportive care).   FEN - CLD, consider diet advancement pending x-ray this afternoon VTE - SCD's. Chemical VTE has been held due to hematuria ID - Rocephin  for pyelo Admit - [facility] service   Pyelonephritis, hematuria - urology has been consulted AKI on CKD III HTN CAD s/p PCI 2009 - [facility] going to reach out to cardiology to see if ASA/Plavix  can be held COPD DM2 Charcot-maria-tooth disease Deafness Peripheral neuropathy  AAA  BPH   I reviewed nursing notes, Consultant urology  notes, hospitalist notes, last 24 h vitals and pain scores, last 48 h intake and output, last 24 h labs and trends, and last 24 h imaging results.  Almarie GORMAN Pringle, [facility] 10/14/2023, 8:42 AM Please see Amion for pager number during day hours 7:00am-4:30pm or 7:00am -11:30am on weekends

## 2023-10-14 NOTE — TOC Progression Note (Addendum)
 Transition of Care [facility]) - Progression Note    Patient Details  Name: Franklin [name] MRN: [id number] Date of Birth: December 28, 1950  [facility] CM/SW Contact  NORMAN ASPEN, LCSW Phone Number: 10/14/2023, 9:51 AM  Clinical Narrative:    Met with pt and wife this morning to address [facility] referral for Crisis counseling.  Prior to speaking with family, spoke with RN to clarify reason for order (?specific concern) and was told daughter wanting patient advocate.   Upon entering room and introducing myself, daughter quickly asks, Are you my dad's patient advocate?  Clarified TOC/ CSW role to assist with support and dc planning needs.  Daughter became very frustrated and stated, they just keep sending people in that can't help us .  Attempted to provide support and clarify needs.  Daughter reports that they are unhappy with care being provided here and their overall experience.  Daughter confirms that she was provided with ph# and email for Office of Patient Experience yesterday but have not received any contact.  Daughter states she does not feel that CSW can assist with their concerns.   Have alerted the [facility] of this visit.  TOC will continue to follow along to assist if any specific dc needs, however, daughter's corrent concerns should be directed to Office of Patient Experience.   Expected Discharge Plan: Home/Self Care Barriers to Discharge: Continued Medical Work up  Expected Discharge Plan and Services   Discharge Planning Services: CM Consult                                           Social Determinants of Health (SDOH) Interventions SDOH Screenings   Food Insecurity: No Food Insecurity (10/12/2023)  Housing: Low Risk  (10/12/2023)  Transportation Needs: No Transportation Needs (10/12/2023)  Utilities: Not At Risk (10/12/2023)  Alcohol Screen: Low Risk  (12/12/2022)  Depression (PHQ2-9): Low Risk  (08/28/2023)  Financial Resource Strain: Low Risk  (08/26/2023)   Physical Activity: Insufficiently Active (08/26/2023)  Social Connections: Moderately Integrated (10/12/2023)  Stress: No Stress Concern Present (08/26/2023)  Tobacco Use: High Risk (10/11/2023)  Health Literacy: Adequate Health Literacy (12/12/2022)    Readmission Risk Interventions     No data to display

## 2023-10-14 NOTE — Plan of Care (Signed)

## 2023-10-15 ENCOUNTER — Other Ambulatory Visit (HOSPITAL_COMMUNITY): Payer: Self-pay

## 2023-10-15 ENCOUNTER — Encounter: Payer: Self-pay | Admitting: Family Medicine

## 2023-10-15 DIAGNOSIS — K567 Ileus, unspecified: Secondary | ICD-10-CM | POA: Insufficient documentation

## 2023-10-15 DIAGNOSIS — N10 Acute tubulo-interstitial nephritis: Secondary | ICD-10-CM | POA: Diagnosis not present

## 2023-10-15 LAB — BASIC METABOLIC PANEL WITH GFR
Anion gap: 14 (ref 5–15)
BUN: 31 mg/dL — ABNORMAL HIGH (ref 8–23)
CO2: 20 mmol/L — ABNORMAL LOW (ref 22–32)
Calcium: 9.2 mg/dL (ref 8.9–10.3)
Chloride: 103 mmol/L (ref 98–111)
Creatinine, Ser: 1.98 mg/dL — ABNORMAL HIGH (ref 0.61–1.24)
GFR, Estimated: 35 mL/min — ABNORMAL LOW (ref >60)
Glucose, Bld: 122 mg/dL — ABNORMAL HIGH (ref 70–99)
Potassium: 3.9 mmol/L (ref 3.5–5.1)
Sodium: 137 mmol/L (ref 135–145)

## 2023-10-15 LAB — CBC
HCT: 36 % — ABNORMAL LOW (ref 39.0–52.0)
Hemoglobin: 11.7 g/dL — ABNORMAL LOW (ref 13.0–17.0)
MCH: 31 pg (ref 26.0–34.0)
MCHC: 32.5 g/dL (ref 30.0–36.0)
MCV: 95.5 fL (ref 80.0–100.0)
Platelets: 206 10*3/uL (ref 150–400)
RBC: 3.77 MIL/uL — ABNORMAL LOW (ref 4.22–5.81)
RDW: 12.6 % (ref 11.5–15.5)
WBC: 6 10*3/uL (ref 4.0–10.5)
nRBC: 0 % (ref 0.0–0.2)

## 2023-10-15 LAB — GLUCOSE, CAPILLARY: Glucose-Capillary: 158 mg/dL — ABNORMAL HIGH (ref 70–99)

## 2023-10-15 MED ORDER — CEFADROXIL 500 MG PO CAPS
500.0000 mg | ORAL_CAPSULE | Freq: Two times a day (BID) | ORAL | 0 refills | Status: AC
  Filled 2023-10-15: qty 14, 7d supply, fill #0

## 2023-10-15 MED ORDER — POLYETHYLENE GLYCOL 3350 17 GM/SCOOP PO POWD
17.0000 g | Freq: Two times a day (BID) | ORAL | 0 refills | Status: AC
  Filled 2023-10-15: qty 238, 7d supply, fill #0

## 2023-10-15 MED ORDER — FINASTERIDE 5 MG PO TABS
5.0000 mg | ORAL_TABLET | Freq: Every day | ORAL | 0 refills | Status: AC
  Filled 2023-10-15: qty 30, 30d supply, fill #0

## 2023-10-15 NOTE — Discharge Summary (Signed)
 Physician Discharge Summary   Patient: Franklin [name] MRN: [id number] DOB: 25-Jul-1950  Admit date:     10/12/2023  Discharge date: 10/15/23  Discharge Physician: Elgin Lam, MD   PCP: Zollie Lowers, MD   Recommendations at discharge:  PCP follow-up Urology follow-up Consider nephrology follow-up Repeat BMP in 3-5 days  Discharge Diagnoses: Principal Problem:   Acute pyelonephritis Active Problems:   Constipation   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)   Hypertension   Ileus (HCC)  Resolved Problems:   * No resolved hospital problems. *  Hospital Course: Franklin [name] is a 73 y.o. male with a history of diabetes mellitus type 2, CAD status post GI, peripheral, COPD, disease, Charcot-Marie-Tooth disease, deafness.  Patient presented secondary to abdominal pain and constipation.  Patient recently seen in the emergency department and diagnosed with acute pyelonephritis, started on antibiotic therapy.  Patient started on a bowel regimen this admission for management of constipation.  Antibiotics continued for management of pyelonephritis.  General surgery was consulted for possible ileus seen on imaging and patient was given Gastrografin with eventual production of multiple bowel movements.  Also during hospitalization, patient developed  gross hematuria.  Urology consulted with recommendation for outpatient follow-up for consideration of cystoscopy.  Hemoglobin stable.  Patient started on finasteride per urology recommendations.  Patient discharged on continue antibiotics.  Assessment and Plan:  Pyelonephritis Recent CT abdomen/pelvis from 6/18 significant for evidence of uncomplicated right-sided pyelonephritis. Patient was given Ceftriaxone  IV x1 and started on Keflex at that time. Urine culture from 6/18 significant for no growth. Blood and urine cultures obtained this admission. Patient started empirically on Ceftriaxone  this admission.  Urine culture this admission significant for no growth. Unclear if patient received antibiotics prior to initial urine culture sample.  Patient transition to cefadroxil on discharge.   Constipation Ileus Per family, this is not a chronic issue. Last bowel movement was on 6/17. Abdominal CT scan on 6/18 significant for evidence of stool retention in the rectosigmoid colon. Patient started on a bowel regimen including MiraLAX  and Senokot-S. Fleet and SMOG enemas administered with some minimal output. Repeat abdominal x-ray obtained and was significant for ileus.  General surgery was consulted and gave Gastrografin with resultant several bowel movements.   AKI on CKD stage IIIa Baseline creatinine of around 1.4.  Creatinine of 1.9 on admission with increased to a peak of 2.09.  Presumed secondary to poor oral intake, however this is unknown at this time; patient has also had a recent CT abd/pelvis with contrast. Patient with associated gross hematuria. No casts noted on urine microscopy. Creatinine trending down slowly.  Recommendation for repeat BMP in 3 to 5 days to assess for continued improvement.  Return precautions discussed with patient.  Consider nephrology referral as an outpatient.   Gross hematuria New problem. Complicated by Aspirin  and Plavix  with history of BPH. Slight drop in hemoglobin. Patient never received Lovenox  this admission.  Urology was consulted with recommendation for outpatient follow-up for cystoscopy.   Primary hypertension Patient is on lisinopril  which is held secondary to AKI.   CAD s/p PCI Stable. No chest pain. History of stent x2 from 2009. Discussed with patient's primary cardiologist who recommended Plavix  monotherapy, but was okay with aspirin  monotherapy is needed for procedures. Preference is Plavix  monotherapy, however. Discharge on Plavix ; discontinued aspirin .   COPD Patient ordered DuoNeb as needed while inpatient.   Diabetes mellitus type 2 Well  controlled based on hemoglobin A1C of  6.6%.  Patient is on metformin  as an outpatient which is held on admission and also held on discharge secondary to AKI.   Charcot-Marie-Tooth disease Noted.   Deafness Noted.   Peripheral neuropathy Noted.   Abdominal aortic aneurysm Recent CT abdomen/pelvis identifies a 3.5 cm infrarenal AAA. Recommendation for repeat imaging in 3 years.   BPH Noted on CT scan. Urology recommendation for finasteride   Consultants: Urology, general surgery Procedures performed: None Disposition: Home Diet recommendation: Cardiac diet/carb modified diet   DISCHARGE MEDICATION: Allergies as of 10/15/2023       Reactions   Statins Other (See Comments)   Myalgia        Medication List     PAUSE taking these medications    lisinopril  10 MG tablet Wait to take this until your doctor or other care provider tells you to start again. Commonly known as: ZESTRIL  Take 1 tablet (10 mg total) by mouth daily.   metFORMIN  500 MG tablet Wait to take this until your doctor or other care provider tells you to start again. Commonly known as: GLUCOPHAGE  Take 1 tablet (500 mg total) by mouth 2 (two) times daily with a meal.       STOP taking these medications    aspirin  EC 81 MG tablet   cephALEXin 500 MG capsule Commonly known as: KEFLEX       TAKE these medications    cefadroxil 500 MG capsule Commonly known as: DURICEF Take 1 capsule (500 mg total) by mouth 2 (two) times daily for 7 days. Start taking on: October 16, 2023   clopidogrel  75 MG tablet Commonly known as: PLAVIX  Take 1 tablet (75 mg total) by mouth daily.   finasteride 5 MG tablet Commonly known as: Proscar Take 1 tablet (5 mg total) by mouth daily.   glucose blood test strip Commonly known as: ONE TOUCH TEST STRIPS Test 3 times daily   E11.9   nitroGLYCERIN  0.4 MG SL tablet Commonly known as: NITROSTAT  Place 1 tablet (0.4 mg total) under the tongue every 5 (five) minutes as  needed for chest pain.   ONE TOUCH LANCETS Misc 1 each by Does not apply route 2 (two) times daily.   polyethylene glycol powder 17 GM/SCOOP powder Commonly known as: GLYCOLAX /MIRALAX  Take 17 grams dissolved in liquid by mouth 2 (two) times daily.   sildenafil  20 MG tablet Commonly known as: REVATIO  Take 2-5 pills at once, orally, with each sexual encounter What changed:  how much to take how to take this when to take this reasons to take this additional instructions        Follow-up Information     Stacks, Butler, MD. Schedule an appointment as soon as possible for a visit in 1 week(s).   Specialty: Family Medicine Why: For hospital follow-up Contact information: [address] [place] [address] [phone]         Shane Steffan BROCKS, MD. Schedule an appointment as soon as possible for a visit in 1 week(s).   Specialty: Urology Why: For hospital follow-up Contact information: [address] [place] [place] [address] [phone]                Discharge Exam: BP 135/64 (BP Location: Left Arm)   Pulse 94   Temp (!) 97.4 F (36.3 C) (Oral)   Resp 17   Ht 6' 5 (1.956 m)   Wt 81.3 kg   SpO2 98%   BMI 21.25 kg/m   General exam:  Appears calm and comfortable Respiratory system: Clear to auscultation. Respiratory effort normal. Cardiovascular system: S1 & S2 heard, RRR.  Gastrointestinal system: Abdomen is nondistended, soft and nontender. Normal bowel sounds heard. Central nervous system: Alert and oriented. No focal neurological deficits. Psychiatry: Judgement and insight appear normal. Mood & affect appropriate.   Condition at discharge: stable  The results of significant diagnostics from this hospitalization (including imaging, microbiology, ancillary and laboratory) are listed below for reference.   Imaging Studies: DG Abd Portable 1V Result Date: 10/14/2023 CLINICAL DATA:  Small bowel obstruction EXAM: PORTABLE ABDOMEN - 1 VIEW  COMPARISON:  10/13/2023 FINDINGS: Contrast medium is present in the colon and rectum. No dilated bowel. Aortoiliac atherosclerosis. Degenerative facet arthropathy on the right at L4-5. IMPRESSION: 1. Contrast medium is present in the colon and rectum. No dilated bowel. 2.  Aortic Atherosclerosis (ICD10-I70.0). 3. Degenerative facet arthropathy on the right at L4-5. Electronically Signed   By: Ryan Salvage M.D.   On: 10/14/2023 15:26   DG Abd 2 Views Result Date: 10/13/2023 CLINICAL DATA:  Abdominal distention and constipation EXAM: ABDOMEN - 2 VIEW COMPARISON:  CT abdomen and pelvis dated 10/09/2023 FINDINGS: Mild gas-filled dilation of transverse colon. No free air or pneumatosis. No abnormal radio-opaque calculi or mass effect. No acute or substantial osseous abnormality. The sacrum and coccyx are partially obscured by overlying bowel contents. Partially imaged lung bases are clear. IMPRESSION: Mild gas-filled dilation of transverse colon, likely ileus. Bowel gas is seen to the level of the rectum. Electronically Signed   By: Limin  Xu M.D.   On: 10/13/2023 15:17   CT ABDOMEN PELVIS W CONTRAST Result Date: 10/09/2023 EXAM:  CT ABDOMEN PELVIS WITH IV CONTRAST INDICATION:  Abdominal/flank pain, stone suspected TECHNIQUE: Spiral CT scanning was performed through the abdomen and pelvis after the patient received IV contrast. FINDINGS: There is no significant abnormality identified in the lung bases, liver, spleen, pancreas and gallbladder. A 3 mm calculus is present at the right ureteropelvic junction. Patchy areas of decreased contrast enhancement are present in the right kidney. There is delayed excretion as compared to the left side. Right perinephric stranding is present with no abnormal collection. No calculus or obstruction is identified. A small calcification is present near the renal pelvis which is in the right renal artery. A right renal cyst is present which does not require imaging follow-up.  There are no adrenal masses. The abdominal bowel loops are unremarkable. There is no evidence of ascites or adenopathy. A 3.5 x 3.2 cm infrarenal abdominal aortic aneurysm is present. There is enlargement of the prostate. The median lobe indents the bladder base. There is no inguinal hernia. Rectosigmoid stool retention is present. There is no pericecal inflammatory process. There is no fracture or bone destruction. IMPRESSION: 1. Uncomplicated right-sided pyelonephritis. 2. No evidence of urinary tract calculus or obstruction. 3. Enlarged prostate. 4. 3.5 cm infrarenal abdominal aortic aneurysm. Follow-up imaging is recommended in 3 years. Please note that CT scanning at this site utilizes multiple dose reduction techniques, including automatic exposure control, adjustment of the MAA and/or KVP according to the patient's size, and use of iterative reconstruction. Electronically signed by: Eddy Oar MD 10/09/2023 06:16 PM EDT RP Workstation: [id number]    Microbiology: Results for orders placed or performed during the hospital encounter of 10/12/23  Urine Culture     Status: None   Collection Time: 10/12/23  1:28 AM   Specimen: Urine, Clean Catch  Result Value Ref Range Status   Specimen Description  Final    URINE, CLEAN CATCH Performed at [facility], [address], [place], [place] [address]    Special Requests   Final    NONE Performed at [facility], [address], [place], [place] [address]    Culture   Final    NO GROWTH Performed at [facility], [address], [place], [place] [address]    Report Status 10/13/2023 FINAL  Final  Culture, blood (Routine X 2) w Reflex to ID Panel     Status: None (Preliminary result)   Collection Time: 10/12/23  2:28 AM   Specimen: BLOOD  Result Value Ref Range Status   Specimen Description   Final    BLOOD RIGHT ANTECUBITAL Performed at [facility], [address]  [place], [place] [address]    Special Requests   Final    BOTTLES DRAWN AEROBIC AND ANAEROBIC Blood Culture results may not be optimal due to an inadequate volume of blood received in culture bottles Performed at [facility], [address], [place], [place] [address]    Culture   Final    NO GROWTH 3 DAYS Performed at [facility], [address], [place], [place] [address]    Report Status PENDING  Incomplete  Culture, blood (Routine X 2) w Reflex to ID Panel     Status: None (Preliminary result)   Collection Time: 10/12/23  2:33 AM   Specimen: BLOOD RIGHT FOREARM  Result Value Ref Range Status   Specimen Description   Final    BLOOD RIGHT FOREARM Performed at [facility], [address] [place], [place] [address]    Special Requests   Final    BOTTLES DRAWN AEROBIC AND ANAEROBIC Blood Culture results may not be optimal due to an inadequate volume of blood received in culture bottles Performed at [facility], [address], [place], [place] [address]    Culture   Final    NO GROWTH 3 DAYS Performed at [facility], [address], [place], [place] [address]    Report Status PENDING  Incomplete    Labs: CBC: Recent Labs  Lab 10/11/23 2232 10/12/23 0803 10/13/23 0522 10/14/23 0442 10/14/23 1756 10/15/23 0437  WBC 11.2* 11.9* 10.3 8.5  --  6.0  HGB 14.2 13.8 12.1* 11.3* 12.3* 11.7*  HCT 40.6 41.4 36.2* 34.9* 37.8* 36.0*  MCV 90.2 93.5 93.1 94.3  --  95.5  PLT 268 238 185 205  --  206   Basic Metabolic Panel: Recent Labs  Lab 10/12/23 0803 10/13/23 0522 10/13/23 1537 10/13/23 1805 10/14/23 0442 10/15/23 0437  NA 135 133* 131*  --  135 137  K 4.0 3.9 3.8  --  3.8 3.9  CL 98 100 99  --  101 103  CO2 22 22 22   --  22 20*  GLUCOSE 187* 164* 129*  --  132* 122*  BUN 23 30* 30*  --  31* 31*  CREATININE 1.91* 2.09* 2.03*  --  2.01* 1.98*  CALCIUM 9.4 9.0 8.8*  --  9.1 9.2  MG  --   --   --  2.3  --   --     Liver Function Tests: Recent Labs  Lab 10/11/23 2232  AST 26  ALT 22  ALKPHOS 75  BILITOT 0.6  PROT 8.2*  ALBUMIN 4.9   CBG: Recent Labs  Lab 10/14/23 0733 10/14/23 1133 10/14/23 1631 10/14/23 2136 10/15/23 0810  GLUCAP 155* 141* 137* 142* 158*    Discharge time spent: 35 minutes.  Signed: Elgin Lam, MD [facility] 10/15/2023

## 2023-10-15 NOTE — Progress Notes (Signed)
 Discharge mediations delivered to patient at bedside [facility]

## 2023-10-15 NOTE — Progress Notes (Signed)
 [facility] Progress Note     Subjective: CC:  Multiple BMs yesterday. Tolerating PO. States his pain is improved. Still reporting hematuria.   Objective: Vital signs in last 24 hours: Temp:  [97.7 F (36.5 C)-98.4 F (36.9 C)] 97.7 F (36.5 C) (06/24 0535) Pulse Rate:  [71-84] 75 (06/24 0535) Resp:  [16-18] 16 (06/24 0535) BP: (138-161)/(73-81) 138/81 (06/24 0535) SpO2:  [96 %-98 %] 96 % (06/24 0535) Last BM Date : 10/15/23  Intake/Output from previous day: 06/23 0701 - 06/24 0700 In: 1828.3 [P.O.:1320; I.V.:408.2; IV Piggyback:100.1] Out: -  Intake/Output this shift: Total I/O In: 120 [P.O.:120] Out: -   PE: Gen:  Alert, NAD, pleasant, sitting up in chair in street clothes Pulm:  Normal effort Abd: Soft, non-tender, non-distended Skin: warm and dry, no rashes  Psych: A&Ox3   Lab Results:  Recent Labs    10/14/23 0442 10/14/23 1756 10/15/23 0437  WBC 8.5  --  6.0  HGB 11.3* 12.3* 11.7*  HCT 34.9* 37.8* 36.0*  PLT 205  --  206   BMET Recent Labs    10/14/23 0442 10/15/23 0437  NA 135 137  K 3.8 3.9  CL 101 103  CO2 22 20*  GLUCOSE 132* 122*  BUN 31* 31*  CREATININE 2.01* 1.98*  CALCIUM 9.1 9.2   PT/INR No results for input(s): LABPROT, INR in the last 72 hours. CMP     Component Value Date/Time   NA 137 10/15/2023 0437   NA 138 08/28/2023 0900   K 3.9 10/15/2023 0437   CL 103 10/15/2023 0437   CO2 20 (L) 10/15/2023 0437   GLUCOSE 122 (H) 10/15/2023 0437   BUN 31 (H) 10/15/2023 0437   BUN 23 08/28/2023 0900   CREATININE 1.98 (H) 10/15/2023 0437   CREATININE 1.09 10/29/2012 0941   CALCIUM 9.2 10/15/2023 0437   PROT 8.2 (H) 10/11/2023 2232   PROT 7.2 08/28/2023 0900   ALBUMIN 4.9 10/11/2023 2232   ALBUMIN 4.6 08/28/2023 0900   AST 26 10/11/2023 2232   ALT 22 10/11/2023 2232   ALKPHOS 75 10/11/2023 2232   BILITOT 0.6 10/11/2023 2232   BILITOT 0.6 08/28/2023 0900   GFRNONAA 35 (L) 10/15/2023 0437   GFRNONAA 72  10/29/2012 0941   GFRAA 57 (L) 03/29/2020 0912   GFRAA 84 10/29/2012 0941   Lipase     Component Value Date/Time   LIPASE 20 10/11/2023 2232       Studies/Results: DG Abd Portable 1V Result Date: 10/14/2023 CLINICAL DATA:  Small bowel obstruction EXAM: PORTABLE ABDOMEN - 1 VIEW COMPARISON:  10/13/2023 FINDINGS: Contrast medium is present in the colon and rectum. No dilated bowel. Aortoiliac atherosclerosis. Degenerative facet arthropathy on the right at L4-5. IMPRESSION: 1. Contrast medium is present in the colon and rectum. No dilated bowel. 2.  Aortic Atherosclerosis (ICD10-I70.0). 3. Degenerative facet arthropathy on the right at L4-5. Electronically Signed   By: Ryan Salvage M.D.   On: 10/14/2023 15:26   DG Abd 2 Views Result Date: 10/13/2023 CLINICAL DATA:  Abdominal distention and constipation EXAM: ABDOMEN - 2 VIEW COMPARISON:  CT abdomen and pelvis dated 10/09/2023 FINDINGS: Mild gas-filled dilation of transverse colon. No free air or pneumatosis. No abnormal radio-opaque calculi or mass effect. No acute or substantial osseous abnormality. The sacrum and coccyx are partially obscured by overlying bowel contents. Partially imaged lung bases are clear. IMPRESSION: Mild gas-filled dilation of transverse colon, likely ileus. Bowel gas is seen to the level of the rectum.  Electronically Signed   By: Limin  Xu M.D.   On: 10/13/2023 15:17    Anti-infectives: Anti-infectives (From admission, onward)    Start     Dose/Rate Route Frequency Ordered Stop   10/12/23 0645  cefTRIAXone  (ROCEPHIN ) 2 g in sodium chloride  0.9 % 100 mL IVPB        2 g 200 mL/hr over 30 Minutes Intravenous Daily 10/12/23 0636     10/12/23 0030  cefTRIAXone  (ROCEPHIN ) 2 g in sodium chloride  0.9 % 100 mL IVPB        2 g 200 mL/hr over 30 Minutes Intravenous  Once 10/12/23 0022 10/12/23 0130        Assessment/Plan  Ileus in the setting of pyelonephritis - resolved. SBO protocol shows contrast to the level  of the rectum. He is having flatus and BMs. Tolerating Reg diet  CCS will sign off. Call as needed.     LOS: 3 days   I reviewed nursing notes, Consultant urology notes, hospitalist notes, last 24 h vitals and pain scores, last 48 h intake and output, last 24 h labs and trends, and last 24 h imaging results.  This care required straight-forward level of medical decision making.   Almarie Pringle, PA-C [facility] Please see Amion for pager number during day hours 7:00am-4:30pm

## 2023-10-15 NOTE — Progress Notes (Signed)
 Patient, patient's spouse, and patient's daugter were given discharge instructions, and all questions were answered. Patient was walked to the main exit.

## 2023-10-16 ENCOUNTER — Telehealth: Payer: Self-pay

## 2023-10-16 NOTE — Transitions of Care (Post Inpatient/ED Visit) (Signed)
   10/16/2023  Name: Franklin [name] MRN: [id number] DOB: [birth year]  Today's TOC FU Call Status: Today's TOC FU Call Status:: Successful TOC FU Call Completed TOC FU Call Complete Date:  (spoke with wife who states patient is deaf -wife declined TOC program & states they are switching to [place] PCP) Patient's Name and Date of Birth confirmed.  Transition Care Management Follow-up Telephone Call Date of Discharge: 10/15/23 Discharge Facility: [facility]) Type of Discharge: Inpatient Admission Primary Inpatient Discharge Diagnosis:: Acute pyelonephritis  Shona Prow RN, CCM [place]  VBCI-Population Health RN Care Manager [phone]

## 2023-10-16 NOTE — Telephone Encounter (Signed)
 Both messages have been sent to provider and waiting review

## 2023-10-16 NOTE — Telephone Encounter (Signed)
 Copied from CRM [id number]. Topic: Referral - Question >> Oct 16, 2023 11:07 AM Selinda RAMAN wrote: Reason for CRM: [name] the wife of the patient states she left his provider a very important my chart message about 2 referrals she needs for him ASAP as he was seen recently at [facility] and still has blood in his urine. The Urologist [facility] and Nephrologist Dr Jayson Ill are both with in [place] and both listed on her my chart message. She says the Urologist fax number is [phone] if the notes and referral can be sent as soon as possible. Please assist patient further

## 2023-10-17 ENCOUNTER — Telehealth: Payer: Self-pay

## 2023-10-17 ENCOUNTER — Other Ambulatory Visit: Payer: Self-pay | Admitting: Family Medicine

## 2023-10-17 DIAGNOSIS — N10 Acute tubulo-interstitial nephritis: Secondary | ICD-10-CM

## 2023-10-17 DIAGNOSIS — N1831 Chronic kidney disease, stage 3a: Secondary | ICD-10-CM

## 2023-10-17 LAB — CULTURE, BLOOD (ROUTINE X 2): Culture: NO GROWTH

## 2023-10-17 NOTE — Telephone Encounter (Signed)
 I have LM for Dr. Juanito Office and I am currently waiting on a call back in regards to Patient Scheduling.

## 2023-10-17 NOTE — Telephone Encounter (Signed)
 Is there anyway we can expedite this referral? Pt I at hospital and they are not wanting to let him leave until has appointment with nephrology.

## 2023-10-20 ENCOUNTER — Other Ambulatory Visit: Payer: Self-pay | Admitting: Family Medicine

## 2023-10-20 DIAGNOSIS — N1831 Chronic kidney disease, stage 3a: Secondary | ICD-10-CM

## 2023-10-20 DIAGNOSIS — R310 Gross hematuria: Secondary | ICD-10-CM

## 2023-10-22 NOTE — Telephone Encounter (Signed)
 Noted! Thank you

## 2023-10-22 NOTE — Telephone Encounter (Signed)
 Patient is scheduled on [id number] - Wife is aware of Appt information :)

## 2023-10-28 DIAGNOSIS — R809 Proteinuria, unspecified: Secondary | ICD-10-CM | POA: Diagnosis not present

## 2023-10-28 DIAGNOSIS — E1122 Type 2 diabetes mellitus with diabetic chronic kidney disease: Secondary | ICD-10-CM | POA: Diagnosis not present

## 2023-10-28 DIAGNOSIS — N179 Acute kidney failure, unspecified: Secondary | ICD-10-CM | POA: Diagnosis not present

## 2023-10-28 DIAGNOSIS — N1832 Chronic kidney disease, stage 3b: Secondary | ICD-10-CM | POA: Diagnosis not present

## 2023-11-01 ENCOUNTER — Other Ambulatory Visit: Payer: Self-pay | Admitting: Family Medicine

## 2023-11-01 DIAGNOSIS — E1140 Type 2 diabetes mellitus with diabetic neuropathy, unspecified: Secondary | ICD-10-CM

## 2023-11-06 DIAGNOSIS — R3911 Hesitancy of micturition: Secondary | ICD-10-CM | POA: Diagnosis not present

## 2023-11-06 DIAGNOSIS — R310 Gross hematuria: Secondary | ICD-10-CM | POA: Diagnosis not present

## 2023-11-25 DIAGNOSIS — H9325 Central auditory processing disorder: Secondary | ICD-10-CM | POA: Diagnosis not present

## 2023-11-25 DIAGNOSIS — I10 Essential (primary) hypertension: Secondary | ICD-10-CM | POA: Diagnosis not present

## 2023-11-25 DIAGNOSIS — E782 Mixed hyperlipidemia: Secondary | ICD-10-CM | POA: Diagnosis not present

## 2023-11-25 DIAGNOSIS — I25118 Atherosclerotic heart disease of native coronary artery with other forms of angina pectoris: Secondary | ICD-10-CM | POA: Diagnosis not present

## 2023-11-25 DIAGNOSIS — E1159 Type 2 diabetes mellitus with other circulatory complications: Secondary | ICD-10-CM | POA: Diagnosis not present

## 2023-11-25 DIAGNOSIS — N1832 Chronic kidney disease, stage 3b: Secondary | ICD-10-CM | POA: Diagnosis not present

## 2023-11-25 DIAGNOSIS — Z9621 Cochlear implant status: Secondary | ICD-10-CM | POA: Diagnosis not present

## 2023-12-04 ENCOUNTER — Ambulatory Visit: Admitting: Family Medicine

## 2024-04-13 ENCOUNTER — Telehealth: Payer: Self-pay | Admitting: Cardiology

## 2024-04-13 NOTE — Telephone Encounter (Signed)
 Pts wife calling about an appointment for husband. She comes  05/06/24 and would like to know if there is any way her husband can be squeezed in the same day. He is deaf and needs her at appts. Please advise.

## 2024-04-13 NOTE — Telephone Encounter (Signed)
 Left message to call back.

## 2024-04-14 NOTE — Telephone Encounter (Signed)
 Pt spouse Charlann) returning call this am in regards to appointment. Per spouse, pt and spouse usually have their appointments together and in the same room. Pt is deaf and [name] is his advocate. She has already has an appointment scheduled for 1/14, and inquiring about him being squeezed in the schedule also. Will send to scheduling for further assistance with this at the [facility] office. Verbalizes understanding of plan.

## 2024-04-22 NOTE — Telephone Encounter (Signed)
 Left voice message to call back 12/31 to schedule an appointment for pt in [place]

## 2024-04-22 NOTE — Telephone Encounter (Signed)
 Spoke with pt's wife per [facility] regarding an appointment. The pt is due in Jan for an appointment with Dr. Lavona. Wife requested an appointment on 1/14 when she has her appointment with Dr. Lavona in [place]. There are 4 double books that day so I was unable to complete the request. No appointments in [place] available at this time. Wife was told to schedule the appointment when she comes on 1/14 and that a message would be sent to scheduling to see about putting the pt on a wait list. Wife verbalized understanding. All questions if any were answered.

## 2024-04-27 ENCOUNTER — Other Ambulatory Visit (HOSPITAL_COMMUNITY): Payer: Self-pay

## 2024-05-02 NOTE — Progress Notes (Signed)
 Per family in the room, patient is deaf and does not sign. Family is texting for patient to read.

## 2024-05-03 NOTE — ED Notes (Signed)
 CBG is 228, RN aware.

## 2024-05-03 NOTE — ACP (Advance Care Planning) (Signed)
 Patient is a Full Code, Contact is WifeGLENWOOD [phone].

## 2024-05-03 NOTE — Progress Notes (Signed)
 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 Patient was a poor historian and history was obtained by chart review. Patient developed increased shortness of breath and weakness on 05-02-2024 resulting in multiple falls. Due to recurrent falls, family sought medical care   Subjective: Seen and evaluated at bedside.  Detailed history difficult to obtain due to hearing impairment.  History is obtained from medical record and staff as well as patient himself.  He reports feeling generally weak and unwell.  Has recently fallen a number of times.  Objective   Medications:   aspirin   81 mg Oral Daily   azithromycin  500 mg IntraVENous Q24H   clopidogrel  bisulfate  75 mg Oral Daily   finasteride   5 mg Oral Daily   guaiFENesin   400 mg Oral Q8H   heparin  5,000 Units Subcutaneous Q8H 10-04-20   insulin  lispro (HUMALOG,ADMELOG)  1-6 Units Subcutaneous MealsHS   ipratropium-albuterol   3 mL Nebulization Once RSP   ipratropium-albuterol   3 mL Nebulization Q6H SCH RSP   losartan  potassium  12.5 mg Oral Daily   methylPREDNISolone sodium succinate  40 mg IntraVENous Daily   nirmatrelvir - ritonavir  2 tablet Oral BID   PRN medications: acetaminophen  Oral **OR** acetaminophen  Rectal,dextrose  Oral **OR** dextrose  IntraVENous **OR** dextrose  IntraVENous **OR** glucagon Intramuscular,ipratropium-albuterol  Nebulization,ondansetron  IntraVENous,polyethylene glycol Oral  Physical Exam  Temp (24hrs), Avg:98.4 F (36.9 C), Min:97.8 F (36.6 C), Max:99.8 F (37.7 C)  O2 Device: None (Room air) No data recorded   Intakes & Outputs (Last 24 hours) at 05/03/2024 0700 Last data filed at 05/03/2024 0324 24hr Volume @0700   Intake 1248.87 mL  Output  --  Net 1248.87 mL    Objective: General Appearance:  In no acute distress and ill-appearing.   Vital signs: (most recent): Blood pressure (!) 122/59, pulse 61, temperature 97.9 F (36.6 C), temperature source Oral, resp. rate 17, height 1.905 m (6' 3), weight 79.8 kg (176 lb), SpO2 98%.  Vital signs are normal.   HEENT: Normal HEENT exam.   Lungs:  He is not in respiratory distress.  No stridor.  No wheezes.   Heart: S1 normal and S2 normal.  No gallop or friction rub.  Chest: Symmetric chest wall expansion. No chest wall tenderness.   Abdomen: Abdomen is soft and non-distended.  Bowel sounds are normal.   There is no rebound tenderness.  There is no guarding.    Extremities: Normal range of motion.  There is no effusion.   Pulses: Distal pulses are intact.   Neurological: Patient is alert and oriented to person, place and time.  Patient has normal muscle tone and normal coordination.  (Hearing impairment).   Pupils:  Pupils are equal, round, and reactive to light.   Skin:  Warm and dry.  No rash or cyanosis.   Results   Recent Results (from the past 24 hours)  ECG 12 lead   Collection Time: 05/02/24  8:28 PM  Result Value Ref Range   Acquisition Device D3K    Ventricular Rate 97 BPM   Atrial Rate 97 BPM   P-R Interval 134 ms   QRS Duration 106 ms   Q-T Interval 330 ms   QTC Calculation(Bazett) 419 ms   Calculated P Axis  73 degrees   Calculated R Axis -56 degrees   Calculated T Axis -57 degrees   ECG Diagnosis      Normal sinus rhythm Possible Left atrial enlargement Left axis deviation ST & T wave abnormality, consider inferolateral ischemia Abnormal ECG No previous ECGs available   CBC And Differential   Collection Time: 05/02/24  9:02 PM  Result Value Ref Range   WBC 8.1 4.0 - 10.5 thou/mcL   RBC 4.13 (L) 4.63 - 6.08 million/mcL   HGB 11.4 (L) 13.7 - 17.5 gm/dL   HCT 65.0 (L) 59.8 - 48.9 %   MCV 84.5 79.0 - 92.2 fL   MCH 27.6 25.7 - 32.2 pg   MCHC 32.7 32.3 -  36.5 gm/dL   Plt Ct 674 849 - 599 thou/mcL   RDW SD 43.6 35.1 - 46.3 fL   MPV 9.3 (L) 9.4 - 12.4 fL   NRBC% 0.0 0.0 - 0.2 /100WBC   Absolute NRBC Count 0.00 0.00 - 0.01 thou/mcL   NEUTROPHIL % 74.0 %   LYMPHOCYTE % 11.4 %   MONOCYTE % 13.9 %   Eosinophil % 0.1 %   BASOPHIL % 0.1 %   IG% 0.5 %   ABSOLUTE NEUTROPHIL COUNT 5.96 1.50 - 7.50 thou/mcL   ABSOLUTE LYMPHOCYTE COUNT 0.92 (L) 1.00 - 4.50 thou/mcL   Absolute Monocyte Count 1.12 (H) 0.10 - 0.80 thou/mcL   Absolute Eosinophil Count 0.01 0.00 - 0.50 thou/mcL   Absolute Basophil Count 0.01 0.00 - 0.20 thou/mcL   Absolute Immature Granulocyte Count 0.04 (H) 0.00 - 0.03 thou/mcL   Comprehensive metabolic panel   Collection Time: 05/02/24  9:02 PM  Result Value Ref Range   Na 133 (L) 136 - 146 mmol/L   Potassium 4.5 3.7 - 5.4 mmol/L   Cl 95 (L) 97 - 108 mmol/L   CO2 21 20 - 32 mmol/L   AGAP 17 (H) 7 - 16 mmol/L   Glucose 132 (H) 65 - 99 mg/dL   BUN 30 (H) 8 - 27 mg/dL   Creatinine 7.84 (H) 9.23 - 1.27 mg/dL   Ca 9.7 8.6 - 89.7 mg/dL   ALK PHOS 891 25 - 839 U/L   T Bili 0.6 0.0 - 1.2 mg/dL   Total Protein 8.2 6.0 - 8.5 gm/dL   Alb 4.4 3.5 - 4.8 gm/dL   GLOBULIN 3.8 1.5 - 4.5 gm/dL   ALBUMIN/GLOBULIN RATIO 1.2 1.1 - 2.5   BUN/CREAT RATIO 14.0 11.0 - 26.0   ALT 22 0 - 55 U/L   AST 33 0 - 40 U/L   eGFR 32 (L) >=60 mL/min/1.52m2  Gen5 Cardiac Troponin T (TnT5) Baseline Series at: baseline, 1 hour, and 3 hour; Onset of symptoms: Greater than or equal 3 hours   Collection Time: 05/02/24  9:02 PM  Result Value Ref Range   TnT-Gen5 (0hr) 5 <22 ng/L  NT-proBNP   Collection Time: 05/02/24  9:02 PM  Result Value Ref Range   NT-ProBNP 4,024 (H) <=899 pg/mL  Magnesium   Collection Time: 05/02/24  9:04 PM  Result Value Ref Range   Mg 2.2 1.6 - 2.6 mg/dL  Urinalysis w/ Reflex Microscopic; Reflex to Culture - Symptomatic   Collection Time: 05/02/24  9:20 PM  Result Value Ref Range   Urine Color Yellow Yellow    Urine Clarity  Clear Clear   Urine Specific Gravity 1.026 1.005 - 1.030   Urine pH 6.0 5 to 9   Urine Protein - Dipstick 300 (A)  Negative mg/dl   Urine Glucose Negative Negative mg/dL   Urine Ketones Negative Negative mg/dl   Urine Bilirubin Negative Negative mg/dL   Urine Blood 0.1 (A) Negative mg/dL   Urine Nitrite Negative Negative   Urine Urobilinogen <2 <2 mg/dl   Urine Leukocyte Esterase Negative Negative Leu/mcL   Urine Squamous Epithelial Cells 0-2 0 - 2 /HPF   Urine WBC 0-2 0 - 2 /hpf   Urine RBC 0-2 0 - 2 /HPF   Urine Bacteria None None /HPF   UA Microscopic Yes Micro (A) No Micro  Lactic Acid - Yes reflex 2 hour   Collection Time: 05/02/24 10:28 PM  Result Value Ref Range   Lactic Acid 1.4 0.5 - 2.0 mmol/L  Gen5 Cardiac Troponin T (TnT5) 1H   Collection Time: 05/02/24 10:28 PM  Result Value Ref Range   TnT-Gen5 (1hr) 5 <22 ng/L   Delta 1 Hour 0 <5 ng/L  ECG 12 lead   Collection Time: 05/02/24 10:41 PM  Result Value Ref Range   Acquisition Device D3K    Ventricular Rate 88 BPM   Atrial Rate 88 BPM   P-R Interval 152 ms   QRS Duration 96 ms   Q-T Interval 360 ms   QTC Calculation(Bazett) 435 ms   Calculated P Axis 74 degrees   Calculated R Axis -19 degrees   Calculated T Axis -80 degrees   ECG Diagnosis      Normal sinus rhythm Marked ST abnormality, possible inferior subendocardial injury Abnormal ECG When compared with ECG of 02-May-2024 20:28, QRS axis shifted right ST more depressed in Inferior leads   COVID-19, Flu A+B and RSV Nasopharyngeal Swab Nasopharyngeal Swab   Collection Time: 05/02/24 11:50 PM  Result Value Ref Range   Flu A Negative Negative   Flu B Negative Negative   RSV PCR Negative Negative   SARS-COV-2 Detected (A) Not Detected  Gen5 Cardiac Troponin T (TnT5) 3H   Collection Time: 05/03/24 12:05 AM  Result Value Ref Range   TnT-Gen5 (3hr) 5 <22 ng/L   Delta 3 Hour 0 <7 ng/L  ECG 12 lead   Collection Time: 05/03/24  6:32 AM  Result Value Ref  Range   Acquisition Device D3K    Systolic BP 103 mmHg   Diastolic BP 51 mmHg   Ventricular Rate 70 BPM   Atrial Rate 70 BPM   P-R Interval 150 ms   QRS Duration 112 ms   Q-T Interval 428 ms   QTC Calculation(Bazett) 462 ms   Calculated P Axis 73 degrees   Calculated R Axis -49 degrees   Calculated T Axis -80 degrees   ECG Diagnosis      Normal sinus rhythm Left axis deviation Incomplete right bundle branch block Marked ST abnormality, possible inferior subendocardial injury Abnormal ECG When compared with ECG of 02-May-2024 22:41, Incomplete right bundle branch block is now present   ECG 12 lead   Collection Time: 05/03/24  7:15 AM  Result Value Ref Range   Acquisition Device D3K    Systolic BP 122 mmHg   Diastolic BP 58 mmHg   Ventricular Rate 70 BPM   Atrial Rate 70 BPM   P-R Interval 152 ms   QRS Duration 110 ms   Q-T Interval 436 ms   QTC Calculation(Bazett) 470 ms   Calculated P Axis 69 degrees   Calculated R Axis -30 degrees   Calculated T Axis -85 degrees   ECG Diagnosis      Normal sinus  rhythm Left axis deviation Marked ST abnormality, possible inferior subendocardial injury Abnormal ECG When compared with ECG of 03-May-2024 06:32, No significant change was found   POCT Glucose 30  minutes before meals and at bedtime   Collection Time: 05/03/24  7:34 AM  Result Value Ref Range   Glucose, POC 292 (H) 70 - 99 mg/dL   OPERATOR ID [id number]    INSTRUMENT ID [id number]    Imaging: CT Head WO Contrast Result Date: 05/02/2024 INDICATION:    Trauma Head, COMPARISON:   None. TECHNIQUE:    Multiple axial images obtained from the skull base to the vertex without IV contrast  were obtained on  05/02/2024 8:01 PM FINDINGS: The ventricles and subarachnoid spaces are prominent consistent with involutional change. There is decreased attenuation throughout the white matter consistent with small vessel ischemic change. No intracranial mass, mass effect or midline shift.  No acute infarction evident. No intracranial hemorrhage. Paranasal sinuses: Clear. Mastoid air cells: Clear. Calvarium: The patient is status post prior left mastoidectomy. 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   IMPRESSION: No acute intracranial abnormality. Left cochlear implant Electronically Signed by: Marinda Fleming, MD on 05/02/2024 8:57 PM  CT Chest WO Contrast Result Date: 05/02/2024 CT CHEST WITHOUT CONTRAST TECHNIQUE: Multiple axial images through the chest was performed. Multiplanar reconstructions were obtained and reviewed. Dose reduction was utilized (automated exposure control, mA or kV adjustment based on patient size, or iterative image reconstruction). COMPARISON: None. INDICATION: Pneumonia. FINDINGS: Mediastinum: There is no mediastinal or hilar lymphadenopathy.  Thoracic aorta is normal caliber. Heart and pericardium demonstrate no acute abnormality. Coronary artery calcification? yes Lungs/Pleura: Lungs are free of acute infiltrate and edema. There is moderate emphysema. There is a 1.3 x 0.9 cm nodule in posterior left lower lobe. There is no pleural effusion or pneumothorax. Bones/Soft Tissues: No acute abnormality. Visualized Abdomen: No acute abnormality.   IMPRESSION: 1. 1.3 x 0.9 cm left lower lobe nodule. Follow-up per Fleischner guidelines. 2. Coronary artery calcification. 3. Moderate emphysema. Pulmonary emphysema is an independent risk factor for lung cancer. If the patient meets inclusion criteria, low-dose CT lung cancer screening is recommended.  Electronically Signed by: Sonny Livings, MD on 05/02/2024 10:05 PM  CT Spine Cervical WO Contrast Result Date: 05/02/2024 CT cervical spine: INDICATION: Trauma TECHNIQUE: Thin axial scans were performed through the cervical spine. Multiplanar reconstructions were generated in the sagittal and coronal planes. Radiation dose reduction was utilized (automated exposure control, mA or kV adjustment based on patient size, or iterative  image reconstruction). COMPARISON: None FINDINGS: The prevertebral soft tissues are normal without swelling. Vertebral body height and alignment are normal. There is no fracture or subluxation. The posterior elements are in normal alignment. No fracture is noted in the posterior elements. No significant abnormality is noted in the spinal canal. There are degenerative changes of the cervical spine.   IMPRESSION: No acute fracture. No subluxation. Electronically Signed by: Marinda Fleming, MD on 05/02/2024 8:57 PM  XR Chest Ap Portable Result Date: 05/02/2024 Single view of the chest. HISTORY: Tachypnea. There is mild increased density at the left lung base which may represent atelectasis versus left base infiltrate.  There is no pleural effusion. The cardiac silhouette is unremarkable. The visualized osseous structures are unremarkable.   IMPRESSION: Mild left base density which could represent atelectasis versus left base infiltrate. Electronically Signed by: Marinda Fleming, MD on 05/02/2024 8:48 PM  XR Elbow 3+ Views Left Result Date: 05/02/2024 EXAM: XR ELBOW 3+ VIEWS LEFT INDICATION: Injury COMPARISON:  none FINDINGS: There is an olecranon spur. There is a linear lucency through this spur which may reflect a fracture. No other evidence for fracture. No dislocation. No significant joint effusion seen.   IMPRESSION: Olecranon spur which may be fractured. Electronically Signed by: Sonny Livings, MD on 05/02/2024 9:56 PM    Assessment/Plan  Assessment & Plan  74 year old male with history of COPD not on home O2, type 2 diabetes mellitus, CKD stage IIIb, CAD, hypertension among other diagnoses who presented to the emergency room after a fall  Acute COPD exacerbation CT chest negative for pneumonia per radiology Continue steroid therapy, IV Solu-Medrol Bronchodilator therapy as needed and scheduled Continue antibiotic  COVID-19 infection Continue Paxlovid Contact precautions Supportive  therapy Steroid therapy as above  Left elbow olecranon fracture. Left elbow x-ray shows findings suggestive of olecranon fracture Orthopedic surgery consult ordered, pending As needed pain control  Mechanical fall/recurrent falls Fall precautions PT to evaluate and treat when stable  Type 2 diabetes mellitus Monitor glucose Hold metformin  Hold Jardiance C carb diet Use glycemic protocol  CKD stage IIIb Avoid nephrotoxins Renally dose medications Gentle fluid hydration.  Encourage oral intake  Other abnormal CT findings: Pulmonary nodule. CT chest shows nodule 1.3 x 0.9 cm in left lower lobe Follow-up with pulmonology outpatient clinic on discharge  Hypertension Continue losartan  12.5 mg daily  Other chronic medical conditions: Coronary artery disease Continue home medications where appropriate     Electronically signed: Classie Leek, MD 05/03/2024 / 8:46 AM

## 2024-05-04 NOTE — Care Plan (Signed)
" °  Problem: NH GAS EXCHANGE-IMPAIRED Goal: Adequate oxygenation Outcome: Progressing Goal: Adequate ventilation Outcome: Progressing   Problem: Fall Prevention Goal: Absence of falls Outcome: Progressing   Problem: Discharge Planning Goal: Knowledge of medical problems (What is my main problem?) Outcome: Progressing Goal: Knowledge of self care (What do I need to do when I go home?) Outcome: Progressing Goal: Knowledge of treatment plan (Why is it important for me to do this?) Outcome: Progressing Goal: Knowledge of medication management Outcome: Progressing   Problem: Injury Risk, Abnormal Glucose Level Goal: Glucose level within specified parameters Outcome: Progressing   Problem: Sensory Perception - Impaired Goal: Absence of physical injury Outcome: Progressing   Problem: Deep Venous Thrombosis, Risk of Goal: Absence of deep venous thrombosis Outcome: Progressing   Problem: Bleeding, Risk of Goal: Absence of active bleeding Outcome: Progressing Goal: Absence of impaired coagulation signs and symptoms Outcome: Progressing   "

## 2024-05-05 ENCOUNTER — Telehealth: Payer: Self-pay

## 2024-05-05 NOTE — Transitions of Care (Post Inpatient/ED Visit) (Signed)
" ° °  05/05/2024  Name: Franklin [name] MRN: [id number] DOB: 01/03/1951  Today's TOC FU Call Status: Today's TOC FU Call Status:: Successful TOC FU Call Completed TOC FU Call Complete Date: 05/05/24  Patient's Name and Date of Birth confirmed. 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  Signed off.  Richerd Fish, RN, BSN, CCM [facility], [facility] Management Coordinator Direct Dial: [phone]        "

## 2024-07-29 ENCOUNTER — Ambulatory Visit: Admitting: Cardiology
